# Patient Record
Sex: Female | Born: 1935
Health system: Southern US, Community
[De-identification: ages and names within clinical notes are randomized; demographics above are authoritative.]

## PROBLEM LIST (undated history)

## (undated) DIAGNOSIS — M1990 Unspecified osteoarthritis, unspecified site: Secondary | ICD-10-CM

## (undated) DIAGNOSIS — G4700 Insomnia, unspecified: Secondary | ICD-10-CM

## (undated) DIAGNOSIS — I2510 Atherosclerotic heart disease of native coronary artery without angina pectoris: Secondary | ICD-10-CM

## (undated) DIAGNOSIS — T7840XA Allergy, unspecified, initial encounter: Secondary | ICD-10-CM

## (undated) DIAGNOSIS — R001 Bradycardia, unspecified: Secondary | ICD-10-CM

## (undated) DIAGNOSIS — R011 Cardiac murmur, unspecified: Secondary | ICD-10-CM

## (undated) DIAGNOSIS — K760 Fatty (change of) liver, not elsewhere classified: Secondary | ICD-10-CM

## (undated) DIAGNOSIS — E785 Hyperlipidemia, unspecified: Secondary | ICD-10-CM

## (undated) DIAGNOSIS — I495 Sick sinus syndrome: Secondary | ICD-10-CM

## (undated) DIAGNOSIS — I480 Paroxysmal atrial fibrillation: Secondary | ICD-10-CM

## (undated) DIAGNOSIS — I10 Essential (primary) hypertension: Secondary | ICD-10-CM

## (undated) DIAGNOSIS — I219 Acute myocardial infarction, unspecified: Secondary | ICD-10-CM

## (undated) HISTORY — PX: CORONARY STENT PLACEMENT: SHX1402

## (undated) HISTORY — DX: Essential (primary) hypertension: I10

## (undated) HISTORY — DX: Bradycardia, unspecified: R00.1

## (undated) HISTORY — DX: Unspecified osteoarthritis, unspecified site: M19.90

## (undated) HISTORY — DX: Paroxysmal atrial fibrillation: I48.0

## (undated) HISTORY — DX: Allergy, unspecified, initial encounter: T78.40XA

## (undated) HISTORY — PX: FOOT SURGERY: SHX648

## (undated) HISTORY — DX: Atherosclerotic heart disease of native coronary artery without angina pectoris: I25.10

## (undated) HISTORY — DX: Insomnia, unspecified: G47.00

## (undated) HISTORY — PX: CARPAL TUNNEL RELEASE: SHX101

## (undated) HISTORY — PX: HERNIA REPAIR: SHX51

## (undated) HISTORY — DX: Fatty (change of) liver, not elsewhere classified: K76.0

## (undated) HISTORY — DX: Acute myocardial infarction, unspecified: I21.9

## (undated) HISTORY — DX: Hyperlipidemia, unspecified: E78.5

## (undated) HISTORY — PX: BACK SURGERY: SHX140

## (undated) HISTORY — DX: Sick sinus syndrome: I49.5

## (undated) HISTORY — PX: CHOLECYSTECTOMY: SHX55

## (undated) HISTORY — DX: Cardiac murmur, unspecified: R01.1

---

## 1997-10-06 ENCOUNTER — Other Ambulatory Visit: Admission: RE | Admit: 1997-10-06 | Discharge: 1997-10-06 | Payer: Self-pay | Admitting: Gastroenterology

## 1997-11-30 ENCOUNTER — Other Ambulatory Visit: Admission: RE | Admit: 1997-11-30 | Discharge: 1997-11-30 | Payer: Self-pay | Admitting: Gastroenterology

## 1998-02-15 ENCOUNTER — Encounter: Payer: Self-pay | Admitting: Cardiology

## 1998-02-15 ENCOUNTER — Ambulatory Visit (HOSPITAL_COMMUNITY): Admission: RE | Admit: 1998-02-15 | Discharge: 1998-02-15 | Payer: Self-pay | Admitting: Cardiology

## 1998-06-21 ENCOUNTER — Other Ambulatory Visit: Admission: RE | Admit: 1998-06-21 | Discharge: 1998-06-21 | Payer: Self-pay | Admitting: *Deleted

## 1999-06-01 ENCOUNTER — Encounter: Admission: RE | Admit: 1999-06-01 | Discharge: 1999-06-01 | Payer: Self-pay | Admitting: *Deleted

## 1999-06-01 ENCOUNTER — Encounter: Payer: Self-pay | Admitting: *Deleted

## 2000-02-28 ENCOUNTER — Other Ambulatory Visit: Admission: RE | Admit: 2000-02-28 | Discharge: 2000-02-28 | Payer: Self-pay | Admitting: *Deleted

## 2000-07-03 ENCOUNTER — Encounter: Admission: RE | Admit: 2000-07-03 | Discharge: 2000-07-03 | Payer: Self-pay | Admitting: Internal Medicine

## 2000-07-03 ENCOUNTER — Encounter: Payer: Self-pay | Admitting: Internal Medicine

## 2001-09-03 ENCOUNTER — Encounter: Payer: Self-pay | Admitting: *Deleted

## 2001-09-03 ENCOUNTER — Encounter: Admission: RE | Admit: 2001-09-03 | Discharge: 2001-09-03 | Payer: Self-pay | Admitting: *Deleted

## 2002-11-11 ENCOUNTER — Encounter: Payer: Self-pay | Admitting: Obstetrics and Gynecology

## 2002-11-11 ENCOUNTER — Encounter: Admission: RE | Admit: 2002-11-11 | Discharge: 2002-11-11 | Payer: Self-pay | Admitting: Obstetrics and Gynecology

## 2004-11-15 ENCOUNTER — Encounter: Admission: RE | Admit: 2004-11-15 | Discharge: 2004-11-15 | Payer: Self-pay | Admitting: Obstetrics and Gynecology

## 2005-12-17 ENCOUNTER — Encounter: Admission: RE | Admit: 2005-12-17 | Discharge: 2005-12-17 | Payer: Self-pay | Admitting: Internal Medicine

## 2006-09-12 ENCOUNTER — Ambulatory Visit: Payer: Self-pay | Admitting: Internal Medicine

## 2006-09-12 ENCOUNTER — Inpatient Hospital Stay (HOSPITAL_COMMUNITY): Admission: EM | Admit: 2006-09-12 | Discharge: 2006-09-30 | Payer: Self-pay | Admitting: Emergency Medicine

## 2006-09-12 HISTORY — PX: CORONARY ANGIOPLASTY WITH STENT PLACEMENT: SHX49

## 2006-09-17 ENCOUNTER — Encounter (INDEPENDENT_AMBULATORY_CARE_PROVIDER_SITE_OTHER): Payer: Self-pay | Admitting: Cardiology

## 2006-09-23 HISTORY — PX: PACEMAKER INSERTION: SHX728

## 2006-09-24 ENCOUNTER — Encounter (INDEPENDENT_AMBULATORY_CARE_PROVIDER_SITE_OTHER): Payer: Self-pay | Admitting: *Deleted

## 2006-09-24 ENCOUNTER — Ambulatory Visit: Payer: Self-pay | Admitting: *Deleted

## 2006-11-14 ENCOUNTER — Encounter (HOSPITAL_COMMUNITY): Admission: RE | Admit: 2006-11-14 | Discharge: 2007-02-12 | Payer: Self-pay | Admitting: Cardiology

## 2006-12-03 ENCOUNTER — Ambulatory Visit (HOSPITAL_COMMUNITY): Admission: RE | Admit: 2006-12-03 | Discharge: 2006-12-04 | Payer: Self-pay | Admitting: Cardiology

## 2006-12-03 HISTORY — PX: CORONARY ANGIOPLASTY WITH STENT PLACEMENT: SHX49

## 2007-01-16 ENCOUNTER — Encounter: Admission: RE | Admit: 2007-01-16 | Discharge: 2007-01-16 | Payer: Self-pay | Admitting: Family Medicine

## 2007-02-13 ENCOUNTER — Encounter (HOSPITAL_COMMUNITY): Admission: RE | Admit: 2007-02-13 | Discharge: 2007-03-25 | Payer: Self-pay | Admitting: Cardiology

## 2007-03-08 ENCOUNTER — Emergency Department (HOSPITAL_COMMUNITY): Admission: EM | Admit: 2007-03-08 | Discharge: 2007-03-08 | Payer: Self-pay | Admitting: Emergency Medicine

## 2007-04-07 ENCOUNTER — Encounter: Admission: RE | Admit: 2007-04-07 | Discharge: 2007-04-07 | Payer: Self-pay | Admitting: Family Medicine

## 2007-06-02 ENCOUNTER — Encounter: Admission: RE | Admit: 2007-06-02 | Discharge: 2007-06-02 | Payer: Self-pay | Admitting: Orthopedic Surgery

## 2007-12-31 IMAGING — CR DG CHEST 2V
2 series · 2 of 2 positions shown · non-contrast
Comparison: 09/19/06.

CLINICAL DATA: Pacemaker placement.
 CHEST - 2 VIEW:

[w chest pa]
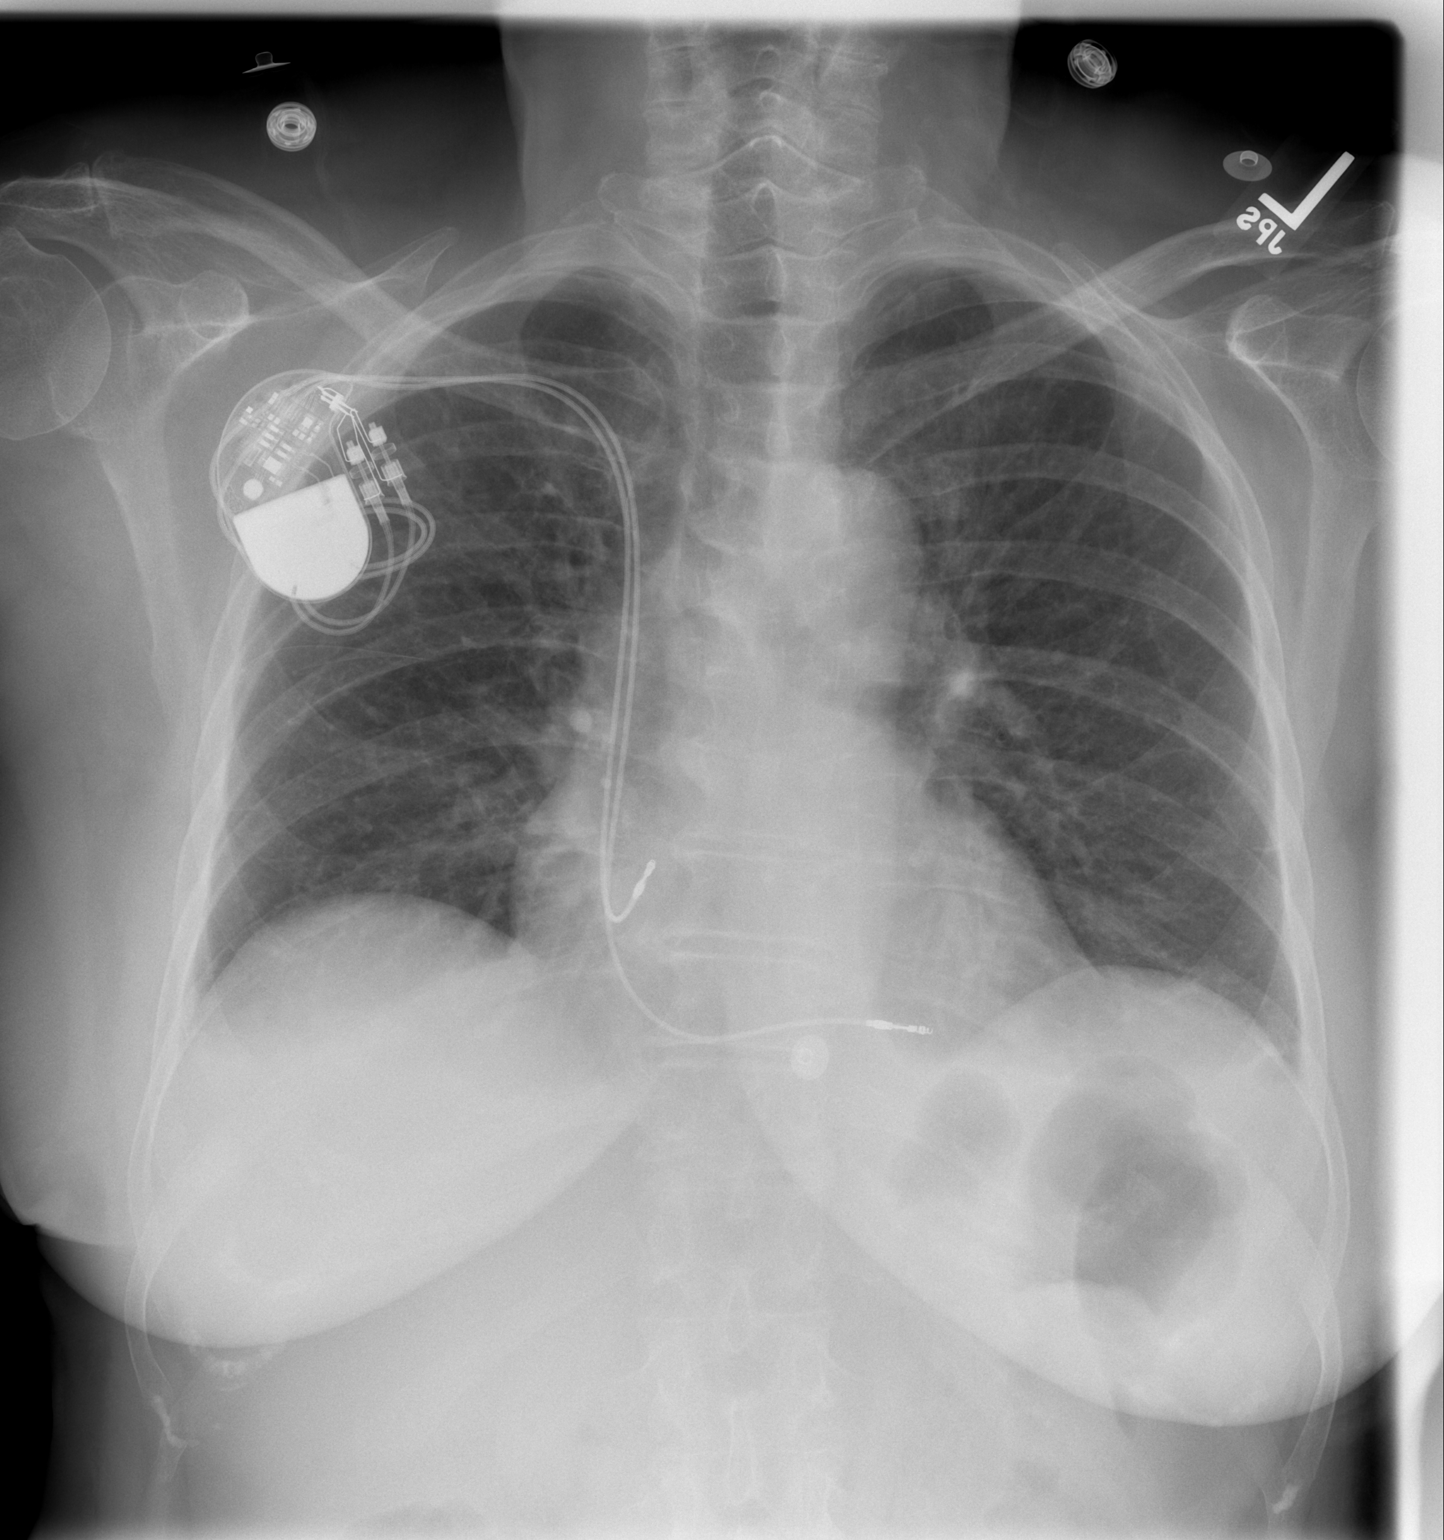

[w chest lat]
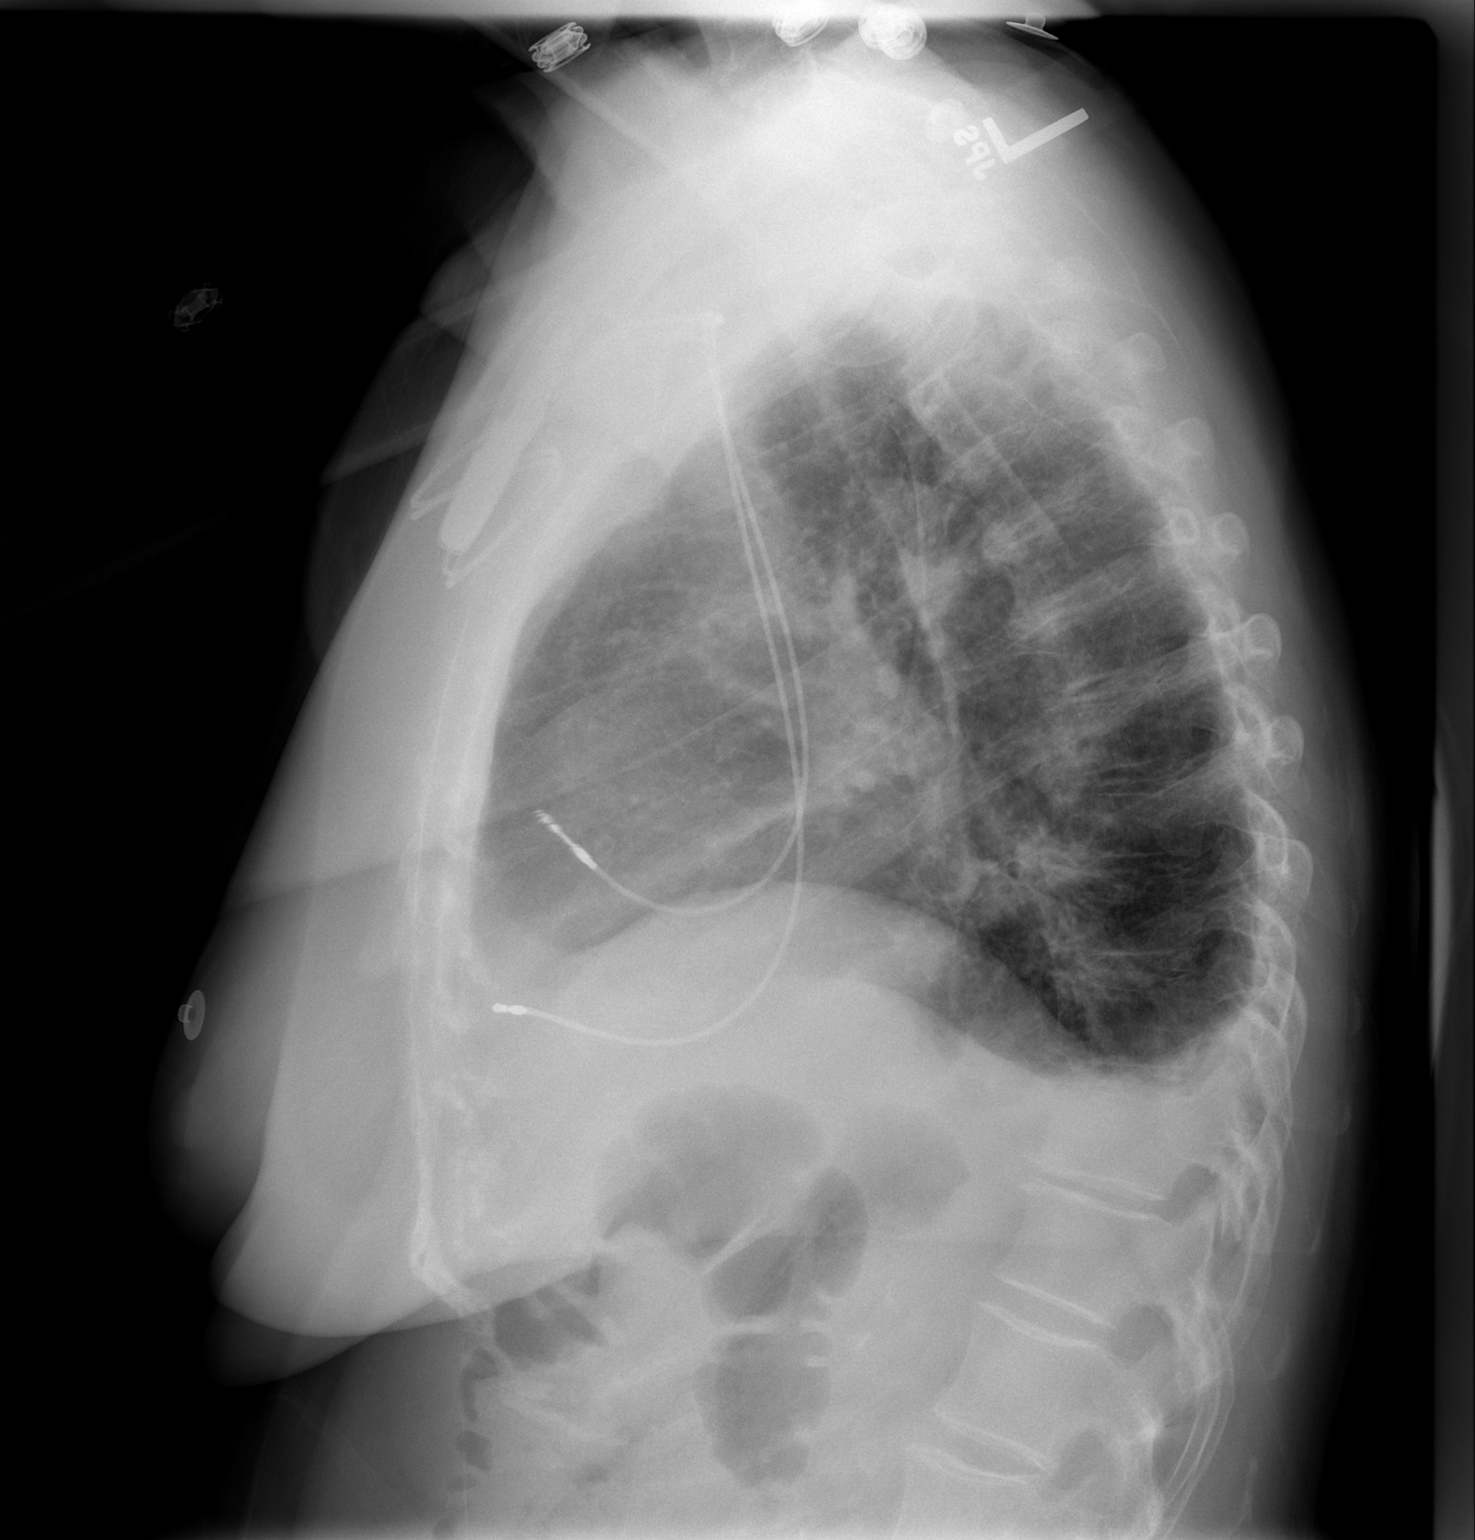

[2 of 2 positions shown; findings below may reference images not displayed]

FINDINGS: Prominent right-sided pacemaker in place with one wire in the right atrium and one wire in the right ventricle. No complicating features are demonstrated. Cardiac silhouette, mediastinal and hilar contours are stable. Persistent vascular congestion and possible mild interstitial edema with small bilateral pleural effusions.
IMPRESSION: 1.  Pacer wires in good position without complicating features.
 2.  Mild vascular congestion and possible interstitial edema.
 3.  Small bilateral pleural effusions.

## 2008-01-19 ENCOUNTER — Encounter: Admission: RE | Admit: 2008-01-19 | Discharge: 2008-01-19 | Payer: Self-pay | Admitting: Family Medicine

## 2009-01-06 ENCOUNTER — Encounter: Admission: RE | Admit: 2009-01-06 | Discharge: 2009-01-06 | Payer: Self-pay | Admitting: Family Medicine

## 2009-02-07 ENCOUNTER — Encounter: Admission: RE | Admit: 2009-02-07 | Discharge: 2009-02-07 | Payer: Self-pay | Admitting: Family Medicine

## 2009-03-23 ENCOUNTER — Encounter (INDEPENDENT_AMBULATORY_CARE_PROVIDER_SITE_OTHER): Payer: Self-pay | Admitting: General Surgery

## 2009-03-23 ENCOUNTER — Observation Stay (HOSPITAL_COMMUNITY): Admission: RE | Admit: 2009-03-23 | Discharge: 2009-03-24 | Payer: Self-pay | Admitting: General Surgery

## 2009-04-20 ENCOUNTER — Encounter: Admission: RE | Admit: 2009-04-20 | Discharge: 2009-04-20 | Payer: Self-pay | Admitting: Cardiovascular Disease

## 2009-04-25 ENCOUNTER — Ambulatory Visit (HOSPITAL_COMMUNITY): Admission: RE | Admit: 2009-04-25 | Discharge: 2009-04-25 | Payer: Self-pay | Admitting: Cardiovascular Disease

## 2010-02-09 ENCOUNTER — Encounter: Admission: RE | Admit: 2010-02-09 | Discharge: 2010-02-09 | Payer: Self-pay | Admitting: Gynecology

## 2010-09-29 LAB — COMPREHENSIVE METABOLIC PANEL
ALT: 77 U/L — ABNORMAL HIGH (ref 0–35)
Alkaline Phosphatase: 55 U/L (ref 39–117)
BUN: 14 mg/dL (ref 6–23)
CO2: 28 mEq/L (ref 19–32)
Chloride: 103 mEq/L (ref 96–112)
GFR calc non Af Amer: >60 mL/min (ref >60)
Glucose, Bld: 88 mg/dL (ref 70–99)
Potassium: 4.6 mEq/L (ref 3.5–5.1)
Sodium: 139 mEq/L (ref 135–145)
Total Bilirubin: 1.6 mg/dL — ABNORMAL HIGH (ref 0.3–1.2)

## 2010-09-29 LAB — DIFFERENTIAL
Basophils Absolute: 0 10*3/uL (ref 0.0–0.1)
Basophils Relative: 0 % (ref 0–1)
Eosinophils Absolute: 0.2 10*3/uL (ref 0.0–0.7)
Monocytes Relative: 11 % (ref 3–12)
Neutro Abs: 3.7 10*3/uL (ref 1.7–7.7)
Neutrophils Relative %: 53 % (ref 43–77)

## 2010-09-29 LAB — CBC
HCT: 40.8 % (ref 36.0–46.0)
Hemoglobin: 13.8 g/dL (ref 12.0–15.0)
RBC: 4.4 MIL/uL (ref 3.87–5.11)
RDW: 13.4 % (ref 11.5–15.5)

## 2010-09-29 LAB — URINALYSIS, ROUTINE W REFLEX MICROSCOPIC
Hgb urine dipstick: NEGATIVE
Nitrite: NEGATIVE
Protein, ur: NEGATIVE mg/dL
Specific Gravity, Urine: 1.008 (ref 1.005–1.030)
Urobilinogen, UA: 0.2 mg/dL (ref 0.0–1.0)

## 2010-09-29 LAB — URINE MICROSCOPIC-ADD ON

## 2010-11-07 NOTE — Cardiovascular Report (Signed)
[name], [name]                 ACCOUNT NO.:  [id number]   MEDICAL RECORD NO.:  [id number]          PATIENT TYPE:  OIB   LOCATION:  6522                         FACILITY:  [facility]   PHYSICIAN:  Madaline Savage, M.D.DATE OF BIRTH:  24-Aug-1927   DATE OF PROCEDURE:  12/03/2006  DATE OF DISCHARGE:                            CARDIAC CATHETERIZATION   PROCEDURES PERFORMED:  1. Selective coronary angiography by Judkins technique.  2. Retrograde left heart catheterization.  3. Left ventricular angiography.  4. Percutaneous coronary artery stenting of the distal right coronary      artery.   COMPLICATIONS:  None.   ENTRY SITE:  Entry site right femoral.   DYE USED:  Omnipaque.   PATIENT PROFILE:  [name] is a 77 year old woman with known right coronary  artery disease who has undergone deployment of two drug-eluting stents  into her proximal and mid RCA within the last 6 months.  She also  required permanent pacemaker implantation for paroxysmal atrial fib and  slow ventricular response.  She enters the hospital today because of  recent chest pain and the concern that there has been progression of her  coronary disease.  She has not had much in the way of circumflex or LAD  disease in the past and her ejection fraction has been good.   RESULTS:  Pressures:  The left ventricular pressure was 120/2.  End-  diastolic pressure is 10.  Central aortic pressure was 120/55, mean of  75.  No aortic valve gradient by pull-back technique.   ANGIOGRAPHIC RESULTS:  The left anterior descending coronary artery and  diagonal are both normal as is the left main.  There is a huge septal  perforator branch that is almost as big as a diagonal and it is normal.  The circumflex is small and nondominant.  One secondary branch of the  circumflex is small and contains a 95% stenosis.  I do not think it is  approachable with a balloon.  The patient may or may not have  pain from  it, so it needs to be  treated medically.   Right coronary artery is the dominant vessel.  The circulation giving  rise to posterior descending and posterolateral branch.  There are radio-  opaque stents proximally and midway down the RCA.  There is a new  stenosis of 95% that is focal just before the distal RCA bifurcates into  the PDA and PLA.   Percutaneous coronary intervention was performed on the 95% distal RCA  stenosis using a 6-French right Judkins guide catheter with a Prowater  wire.  Predilatation balloon, which was a 2.5 Maverick balloon , I  placed a 2.5 x 18 mm Vision stent into the distal RCA after successful  pre balloon dilatation.  I deployed that stent and postdilated with a  2.5 x 15 mm PowerSail balloon inflated to 18 atmospheres of pressure.  The 95% stenosis was reduced to 0% residual.   Left ventricular angiography showed vigorous contractility of all wall  segments and an ejection fraction of 60%.   FINAL IMPRESSION:  1. Unstable angina.  2. Successful stenting of a distal right coronary artery stenosis 95%      to 0 with a nondrug-eluting stent.  3. Good left ventricular systolic function.   PLAN:  The patient will be recovered in the holding area and transferred  to unit 6500 where she will remain overnight and if all goes well, she  will go home tomorrow.  The patient will remain on Plavix and aspirin  and no Coumadin for her atrial fibrillation.           ______________________________  Madaline Savage, M.D.     WHG/MEDQ  D:  12/03/2006  T:  12/04/2006  Job:  [id number]   cc:   [facility] - [facility]

## 2010-11-07 NOTE — Discharge Summary (Signed)
NAMECLARINDA, [name]                 ACCOUNT NO.:  [id number]   MEDICAL RECORD NO.:  [id number]          PATIENT TYPE:  OIB   LOCATION:  6522                         FACILITY:  [facility]   PHYSICIAN:  Madaline Savage, M.D.DATE OF BIRTH:  04-29-1928   DATE OF ADMISSION:  12/03/2006  DATE OF DISCHARGE:  12/04/2006                               DISCHARGE SUMMARY   DISCHARGE DIAGNOSES:  1. Unstable angina, status post revision and stenting to a new RCA      site this admission.  2. Coronary disease, DMI in March of 2008 treated with 2 endeavor      stents to the right coronary artery.  3. Preserved left ventricular function.  4. Heart block, status post PTVDP on September 23, 2006 by Dr. Lewayne Bunting.  5. Deep vein thrombosis during her hospitalization in April of 2008 of      the right lower extremity, not seen in follow-up Dopplers in the      office on Oct 31, 2006.  6. Coumadin therapy; this has been discontinued this admission.  7. Transient renal insufficiency after catheterization, stable.  8. Treated hypertension.  9. Paroxysmal atrial fibrillation.   HOSPITAL COURSE:  The patient is a pleasant 77 year old female who was  seen in the office on November 29, 2006.  She was seen as an add-on.  She was  sent over from cardiac rehab.  She had had some exertional chest pain  that sounded suspicious for angina.  She was seen by Dr. Clarene Duke and  myself in the office, and we actually wanted to admit her to the  hospital and hold her Coumadin and plan on doing a catheterization.  The  patient did not want to stay at the hospital, so we put her on a nitrate  and set her up for outpatient catheterization and had her stop her  Coumadin.  She was admitted December 03, 2006 and cathed by Dr. Elsie Lincoln.  The  catheterization revealed a normal LAD and diagonal.  The circumflex was  nondominant with a small 95% stenosis in the OM.  The RCA had patent  stents in the proximal and midportion with a new site of  95% in the  distal portion of the RCA.  She underwent placement of a Vision stent  which she tolerated well.  Dr. Elsie Lincoln feels she can be discharged on  December 04, 2006.  He thinks she can come off Coumadin at this point  permanently.  We also stopped her Imdur at discharge.  She will follow  up with Dr. Elsie Lincoln in a couple weeks in the office.   LABORATORY DATA:  Sodium 138, potassium 3.7, BUN 16, creatinine 1.0.  White count 7, hemoglobin 11.6, hematocrit 34.2, platelets 158.   ELECTROCARDIOGRAM:  Atrial paced rhythm.   DISCHARGE MEDICATIONS:  1. Coated aspirin 81 mg a day.  2. Metoprolol 25 mg b.i.d.  3. Diltiazem 180 mg a day.  4. Diovan HCTZ 80/12.5 daily.  5. Plavix 75 mg a day.  6. Simvastatin 40 mg a day.  7. Prilosec 20  mg a day.  8. Alprazolam 0.25 one-half tablet b.i.d. p.r.n.  9. Zyrtec 10 mg p.r.n.  10.Hydrocodone 500 mg p.r.n.  11.Nitroglycerin at home and knows to use this on a p.r.n. basis.   DISPOSITION:  The patient discharged in stable condition and will follow-  up with Dr. Elsie Lincoln in the office in a couple weeks.      Abelino Derrick, P.A.    ______________________________  Madaline Savage, M.D.    Lenard Lance  D:  12/04/2006  T:  12/04/2006  Job:  [id number]   cc:   Thora Lance, M.D.

## 2010-11-07 NOTE — Discharge Summary (Signed)
[name], [name]                 ACCOUNT NO.:  [id number]   MEDICAL RECORD NO.:  [id number]          PATIENT TYPE:  INP   LOCATION:  3706                         FACILITY:  [facility]   PHYSICIAN:  Nicki Guadalajara, M.D.     DATE OF BIRTH:  01-Nov-1927   DATE OF ADMISSION:  09/12/2006  DATE OF DISCHARGE:  09/30/2006                               DISCHARGE SUMMARY   DISCHARGE DIAGNOSES:  1. Acute inferior myocardial infarction, non-ST elevation.  2. Coronary artery disease with PTA and stent deployment to the right      coronary artery for acute stenosis.      a.     Diverticular outpouching of the left main with 40-50%       narrowing of the left anterior descending after the septal       perforator, and normal ejection fraction of 60%.  3. Sick sinus syndrome with paroxysmal atrial fibrillation with rapid      ventricular response.  4. Significant bradycardia with heart rates down to 30 and long pauses      of 3-second asystole.  5. Sick sinus syndrome with need for permanent transvenous pacemaker,      September 23, 2006, a [employer] Jude, by Dr. Ladona Ridgel.      a.     Temporary pacemaker inserted by Dr. Elsie Lincoln on September 16, 2006, for acute distress with bradycardia and rapid atrial       fibrillation.      b.     Temporary pacemaker placed on September 21, 2006, for recurrent       6-second pause and runs of atrial fibrillation with rates of 144       beats per minute.  6. Anemia.  7. Low-grade fever treated with Cipro.  8. Patent foramen ovale (PFO).  9. Deep venous thrombosis, with heparin and Coumadin.   DISCHARGE CONDITION:  Improved.   PROCEDURES:  1. September 12, 2006:  Combined left heart cath by Dr. Nicki Guadalajara.  2. September 12, 2006:  PTCA and nondrug-eluting stent to the RCA for 100%      stenosis by Dr. Tresa Endo.  3. September 16, 2006:  Brief cardiac cath with visualization of the right      coronary artery revealing patent stent.  Did have ostial PDA      stenosis of 95%.  4. September 16, 2006:  Temporary pacemaker placed by Dr. Elsie Lincoln secondary      to tachybrady syndrome and symptomatic tachycardia as well as      significant pauses.  5. September 21, 2006:  Temporary pacemaker placed by Dr. Madaline Savage.  6. September 23, 2006:  Permanent transvenous pacemaker by Dr. Lewayne Bunting, placed DDD [facility]. Jude.   DISCHARGE MEDICATIONS:  1. Plavix 75 mg one daily.  Do not stop.  2. Coumadin 5 mg daily in the evening between 5 and 6.  3. Diovan HCT 80/12.5 one daily.  4. Metoprolol tartrate 25 mg half a tablet twice a day.  5. Prilosec over-the-counter one daily.  6. Zocor 40 mg every evening.  7. Alprazolam 0.25 one-half tablet twice a day as needed.  8. Claritin 10 mg daily as needed.  9. Guaifenesin 1-2 teaspoons every 4 hours as needed.  10.Cardizem CD 180 mg daily.  11.Darvocet N 100 one or two every 4-6 hours as needed for pain.   DISCHARGE INSTRUCTIONS:  1. Have blood work done every week times 4 weeks every Monday.  2. Avoid salt; do no force fluids to drink.  3. Stop taking aspirin.  4. See pacemaker sheet for instructions on activity.  5. Increase activity slowly.  6. No lifting for 3 weeks.  7. No driving for 2 weeks.  8. May shower after September 30, 2006.  9. Low-sodium heart-healthy diet.   HISTORY OF PRESENT ILLNESS:  A 77 year old white female with a history  of four-vessel coronary disease treated medically, who presented to the  ER on September 12, 2006, after a week of chest tightness with exertion.  She presented to the emergency room in early a.m. on 03/20 with chest  pain radiation to the left shoulder and back. Her enzymes came back  positive.  EKG was nonspecific ST changes.  She was first placed on IV  heparin and nitro, and plans were for cardiac cath at first available.   PAST MEDICAL HISTORY:  1. Hypertension.  2. Coronary disease.  3. Aortic valve stenosis.  4. History of a hernia repair.   Her last Myoview was May 2007.  EF was 86%,  essentially normal perfusion  scan.  On echo in May 2007, she had mild aortic stenosis.  LV function  was normal.   ALLERGIES:  No known allergies.  She had refused statins in the past.   FAMILY HISTORY, SOCIAL HISTORY AND REVIEW OF SYSTEMS:  See H&P.   PHYSICAL EXAMINATION ON DISCHARGE:  VITAL SIGNS: Early on the morning of  September 30, 2006, blood pressure was 119/65, pulse 74, respiratory rate 20,  temperature 98.8, and oxygen saturation room air 93%.  HEART:  Regular rate and rhythm.  LUNGS:  Clear to auscultation bilaterally.  ABDOMEN:  Soft, nontender.  EXTREMITIES:  Incision healing nicely; no hematoma.   On venous Doppler study, DVT was noted in the calf vein of the right  lower extremity.  DVT courses along the mid posterior aspect of the  distal calf from about 5 inches above the heels of the mid calf.   EKGs:  Initial EKG was almost junctional in appearance.  Heart rate 55,  nonspecific ST changes.  Dr. Wallace Cullens felt this was atrial fibrillation with  junctional escape.  She did have minimal ST elevations in III and aVF.   EKGs would be junctional rhythm, incomplete bundle branch block, left.   On March 23, she had ST elevations in leads I, II and aVL as well as V5  and V6.  It was read as an acute MI.  On the 23rd she went from  junctional to atrial fibrillation with rapid ventricular response.  She  would go in and out of what appeared to be junctional to some sinus  brady with rapid atrial fibrillation.  On the 24th EKG, she had some  pacing on PAF.  On the 25th, sinus rhythm   March 31, she was AV pacing.   RADIOLOGY:  I do not have the original chest x-ray.  On follow-up, lungs  were clear.  No acute disease.  On the 22nd, mild interstitial edema.  On the 23rd, indistinct central vascular structures most consistent with  mild edema or vascular congestion.  By the 24th, pulmonary edema and scattered atelectasis, probable right pleural effusion.  On the 24th,  her  pacemaker was overlying the right ventricle.  No pneumothorax.  Diffuse interstitial edema and small right pleural effusion.  On the  27th, mild improvement in interstitial edema.  On 09/27/06, pacer wires  in good position without complicating features.  Mild vascular  congestion and possible interstitial edema, small bilateral effusions.  On April 6, interval decrease in small bilateral pleural effusions, mild  cardiomegaly and venous congestion.   HOSPITAL COURSE:  Ms. Ringel was admitted by Dr. Tresa Endo on call for Dr.  Elsie Lincoln on 09/12/06 after the patient presented with chest pain and  positive enzymes.  She underwent cardiac catheterization that day  revealing 100% to 99.9% RCA stenosis, undergoing PTCA and stent  deployment x2 stents of the RCA.  Other results as above.  She did well  and proceeded to the CCU.  Blood pressure ranged 89-104 systolic.  It  was positive for an acute inferior MI.  She had right subconjunctival  hematoma secondary to vomiting.  We attempted to help her grandson come  back from [place] through the [employer].  She did have some chest pain post  procedure.   She continued to slowly improve though she continued with right shoulder  pain and tightness.  No ischemia on her EKG.  By 09/20/06 at 11:45, the  patient's heart rate was either atrial fibrillation with rapid  ventricular response and then it would drop to the upper 30s to 40s.  She had no chest pain, but positive neck discomfort.  EKG was ordered.  She was having up to 3-second pauses, with heart rate at times down to  the 20s, and then would rebound up to 140 beats per minute.  Dr. Elsie Lincoln  came in and did a right coronary artery cath to ensure the stents were  stable and open and they were any, and then he placed a temporary  pacemaker.  The patient tolerated the procedure and actually felt  better.  She was anemic the next morning, but otherwise improving.   She also had some episodes of wide complex  tachycardia.  She then was in  rate controlled atrial fibrillation.  Creatinine was stable.  TEE was  done.  Intra-aortic septum was positive for PFO, mild atheromatosis,  degeneration of arch, complex plaque, not highly mobile.  At times blood  pressure would be low and we would hold her medications.  The patient  was monitored closely throughout the hospital stay.  On 09/18/2006, she  had maintained a regular rhythm or atrial fibrillation with her rate  controlled and the temporary pacemaker was discontinued.  The patient  was stable.  She had also some GI bleed and was on Cipro.  On  09/21/2006, unfortunately she developed significant bradycardia again  and a second temporary pacemaker was placed on 09/21/2006; this at 10:20  in the morning.   By 03/29, again had rapid atrial fibrillation.  Unfortunately the nurses  had documented ventricular tachycardia, but it was actually atrial fibrillation with heart rates of 144 and 6-second pauses.  Temporary  pacer was placed and plans were for permanent pacemaker the next day.  She was transferred back to the CCU.  EP consult was obtained for  pacemaker placement after her recent MI.  She underwent permanent  pacemaker placement on September 23, 2006,  by Dr. Ladona Ridgel.  She was stable  and was transferred to the floor and improving slowly, but then she did  complain of right lower leg pain.  We ordered venous Dopplers which were  positive for DVT.  She was then placed on heparin and  Coumadin crossover.  Pacer site remained stable.  Plans were to  discontinue the aspirin at discharge.  She continued to improve, was  ambulating, site looked well.  By 09/30/06, she was ready for discharge  home.  She was seen and discharged by Dr. Elsie Lincoln.      Darcella Gasman. Annie Paras, N.P.    ______________________________  Nicki Guadalajara, M.D.    LRI/MEDQ  D:  12/07/2006  T:  12/08/2006  Job:  [id number]   cc:   Nicki Guadalajara, M.D.  Madaline Savage, M.D.  Burnell Blanks, MD  Doylene Canning. Ladona Ridgel, MD

## 2010-11-10 NOTE — Cardiovascular Report (Signed)
NAMESAMEENA, [name]                 ACCOUNT NO.:  [id number]   MEDICAL RECORD NO.:  [id number]          PATIENT TYPE:  INP   LOCATION:  2807                         FACILITY:  [facility]   PHYSICIAN:  Nicki Guadalajara, M.D.     DATE OF BIRTH:  08/24/1927   DATE OF PROCEDURE:  09/12/2006  DATE OF DISCHARGE:                            CARDIAC CATHETERIZATION   INDICATIONS:  [name] is a 77 year old patient who has  previously documented mild multivessel coronary obstructive structure  disease by cardiac catheterization in 2004 by Dr. Elsie Lincoln.  She does have  a history of hypertension.  Apparently she has refused taking a  statin.  She had been doing relatively well on medical therapy until 3-  4 days ago when she began to experience recurrent episodes of chest  pain.  Approximately 5:00 p.m. last night she developed more significant  chest pressure.  This persisted essentially all night.  Early this  morning she presented to [facility].  Her symptoms improved  with sublingual nitroglycerin.  ECG did not show any ST-segment  elevation but did show nonspecific T changes.  Cardiac enzyme markers  were positive for acute coronary syndrome with positive CPK, MB, and  troponin level.  She was started on Integrilin, IV heparin, IV  nitroglycerin, given aspirin in the emergency room.  She is brought up  to the linear coronary unit for further evaluation.   PROCEDURE:  After pre premedication with Valium 5 mg intravenously, the  patient was prepped and draped in the usual fashion.  The right femoral  artery was punctured anteriorly, and a 5-French sheath was inserted.  Diagnostic catheterization was done with 5-French Judkins 4 left and  right coronary catheters.  A 5-French pigtail catheter was used for  biplane selective angiography.  The right catheter was also used for  selective angiography into the left subclavian artery in the event the  patient needed surgery to assess the LIMA.   Distal aortography was also  done due to the patient's hypertensive history and coronary disease to  make certain she did not have any significant aortoiliac disease or  renal vascular etiology to her hypertension.   At this point, I broke scrub.  The patient did have total occlusion of  her RCA accounting for acute coronary syndrome but had collateralization  at least to the crux from left-to-right filling and directly  contributing to her amelioration of symptoms.  I was concerned about the  possibility of diverticula or outpouching of her left main.  Prior to  proceeding with coronary intervention, I did ask Dr. Tyrone Sage to come  into the room where he reviewed the angiograms and felt that the left  main did not need to be operated on.  Consequently, attention was then  directed at opening up the recent occluded RCA.   The coronary intervention was a very difficult procedure secondary to  catheter backup difficulties, very difficult to cross the occlusion, and  also multiple sites were intervened upon.  A 6-French sheath was used  for the interventional procedure.  Initially an FR-4 right  guide was  used.  In the emergency room, the patient had already been started on  Integrilin and heparin.  Additionally, heparin was given prior to the  intervention to make certain ACT was therapeutic.  She also received a  total of 600 mg of oral Plavix for the procedure and received an  additional bolus of Integrilin since only a single bolus had been  administered in the emergency room.  Initially an Asahi medium wire was  advanced into the proximal RCA.  There was significant difficulty in  having this cross the occlusion and essentially was unable to cross  this.  The catheter wire oftentimes seemed to get into the most  proximal, very tiny the marginal-like branch.  A 2.0 Maverick balloon  was inserted to aid with crossing, but, again, this was unsuccessful.  Ultimately, a long Luge wire was  then inserted.  Again, there was  significant difficulty in crossing the blockage.  The proximal occlusion  was finally crossed.  However, the wire was not able to cross the second  occlusion just beyond the proximal one despite using a 2.0 over-the-wire  balloon for support.  At this point, the entire system was removed, and  a hockey-stick guide was inserted.  A Whisper wire was then inserted.  Again, there was significant difficulty in crossing the lesion, but  ultimately, with balloon backup support, the Whisper wire was able to  cross.  It was able to cross an additional proximal lesion and  ultimately was able to be advanced down the RCA distally.  The 2.0  Maverick balloon was used, and multiple dilatations were made in the  diffusely diseased proximal segment.  It was also apparent that there  was another area of 95% stenosis proximal to the crux.  Several  dilatations were made.  A 2.5 balloon was then inserted, and multiple  inflations were made diffusely in the proximal segment as well as in  this more distal lesion proximal to the crux.  Ultimately, the balloon  was then removed.  It was felt, due to significant tortuosity and backup  difficulties, that a chromium cobalt stent would best serve this  patient.  I elected to try an Endeavor chromium cobalt drug-eluting  stent.  A 3.0 x 15 mm Endeavor stent was then able to with difficulty  across the proximal diffusely diseased segment with catheter backup  difficulties and ultimately was able to be advanced to the lesion that  was 90-95% proximal to the crux.  Two inflations were made up to 14  atmospheres.  The balloon was then used to dilate the proximal region  further since it was assumed to be potentially very difficult to place a  24 mm length stent.  A 3.0 x 24 mm Endeavor stent was then ultimately  successfully inserted ultimately with much backup difficulties, but was able to cover the entire diffusely diseased proximal  segment.  Two  dilatations up to 14 atmospheres were made. The patient did receive  numerous doses of intracoronary nitroglycerin during the procedure.  Now  that the vessel was opened, there was significantly improved  visualization of the distal vessel.  There appeared to be an 80-85%  diffuse focal area of narrowing just proximal to the PDA takeoff beyond  the crux in the distal RCA.  It was not felt to be reachable by stent.  Consequently, a 3.25 Quantum balloon, which was going to be used for  post stent dilatation, was then able to be  advanced to this distal RCA  site just at the PDA takeoff, and very low-level inflations at 1, 2, and  3 atmospheres were made.  This resulted in a significant improvement  with the 85% stenosis being reduced to zero to less than 5%.  The 3.25  balloon was then used for post stent dilatation of the two previously  placed stents up to 3.25 mm, representing 12 atmospheres.  Scout  angiography confirmed an excellent angiographic result.  During the  procedure, the patient also did vomit, required Zofran.  At the  completion of the procedure, she was completely pain free, alert, with  stable hemodynamics.   HEMODYNAMIC DATA:  Central aortic pressure was 112/60.  Left ventricular  pressure was 112/15, post A-wave 21.   ANGIOGRAPHIC DATA:  There was evidence for coronary calcification  involving the left main-LAD system.   The left main was a moderate-size vessel.  On the superior portion of  the left main, there appeared to be a diverticular outpouch.  This was  reviewed with the surgeons who did not feel that surgical therapy was  required.  The left main then trifurcated into an LAD, a small  intermediate vessel, and the circumflex vessel.   The LAD had proximal irregularity and, after giving rise to a moderate-  size septal perforating artery, on a dip in the vessel had 40-50% smooth  narrowing.  The distal LAD ended in a twin-like system with an  LAD  diagonal system that was mildly tortuous, small caliber, and not  significantly stenosed.   The intermediate vessel was free of significant disease and small  caliber.   The circumflex vessel was tortuous, had mild luminal irregularities  without any significant obstructive disease.   The right coronary artery was totally occluded proximally.  The proximal  right coronary artery had sharp bends, and just prior to the total  occlusion, was 99% stenosed with some calcification.  That noted  previously, there were collaterals left-to-right supplying the distal  RCA; but beyond the crux, this was not visualized.   The left subclavian and internal artery were angiographically normal and  suitable in the event the patient required CVTS surgery.   Biplane cine angiography revealed low-normal global LV function with  mild focal mid to basal hypocontractility on the RAO projection and mild- low posterolateral hypocontractility on the LAO projection.   Distal aortography did not demonstrate any significant aortoiliac  disease.  There was no renal artery stenosis.   Following very difficult complex percutaneous coronary intervention to  the totally occluded RCA with initial TIMI zero flow, the right coronary  was opened and was a very large dominant vessel.  Following multiple  dilatations and ultimately insertion of the long 3-0 x 24 mm Endeavor  Medtronic drug-eluting stent placed proximally, the 100% occlusion  followed by diffuse narrowing of 95% was reduced to 0%.  There was 20-  30% narrowings in the mid portion of the RCA after the proximally placed  stent.  In the mid distal RCA, there had been a 90% stenosis.  Once  there was flow in the vessel, which was significantly eccentric, and  following PTCA and ultimate stenting with a 3-0 x 15 mm endeavor stent,  this was reduced to 0%.  Beyond the crux just proximal to the PDA  takeoff, there was 85% narrowing.  This was dilated to  less than 5%  utilizing the 3.25 mm balloon dilated to 2.9 mm (low atmospheres).  There is mild  irregularity of the PDA, but the RCA ended in a large PDA  system, two inferior LV branches, and a large posterolateral wall  system.  At the completion of the study,  there was brisk TIMI-3 flow.  The patient had stable hemodynamics.   IMPRESSION:  1. Acute coronary syndrome secondary to right coronary artery      occlusion, probably approximately 12-15 hours in duration, with a 3-      day history of progressive anginal symptomatology prior to its      probable occlusion last night.  2. Significant multivessel coronary artery disease with coronary      calcification involving the left main-left anterior descending      (LAD) system with a diverticular outpouching of the left main:      a.     40-50% narrowing in the LAD after the septal perforating       artery.      b.     Total occlusion of the right coronary artery (RCA)       ultimately in the diffusely diseased RCA system.      c.     Very difficult complex but successful percutaneous coronary       intervention with percutaneous transluminal cardiac angioplasty       (PTCA) and stenting and ultimate insertion of a 3-0 x 24 mm       Endeavor stent proximally, a 3-0 x 15 mm Endeavor stent in the mid       distal segment, and PTCA alone in the distal RCA just proximal to       the posterior descending artery takeoff, done with Integrilin,       Plavix, heparin and intracoronary nitroglycerin.           ______________________________  Nicki Guadalajara, M.D.     TK/MEDQ  D:  09/12/2006  T:  09/12/2006  Job:  [id number]   cc:   Madaline Savage, M.D.  Thora Lance, M.D.

## 2010-11-10 NOTE — Cardiovascular Report (Signed)
[name], [name]                 ACCOUNT NO.:  [id number]   MEDICAL RECORD NO.:  [id number]          PATIENT TYPE:  INP   LOCATION:  2905                         FACILITY:  [facility]   PHYSICIAN:  Madaline Savage, M.D.DATE OF BIRTH:  1928-02-16   DATE OF PROCEDURE:  09/16/2006  DATE OF DISCHARGE:                            CARDIAC CATHETERIZATION   PROCEDURES PERFORMED:  1. Dye-limited right coronary angiography in two views only (no left      ventriculogram, no left coronary angiogram).  2. Temporary transvenous pacemaker implantation left subclavian venous      approach with two flush injections into the left subclavian vein.   COMPLICATIONS:  None.   ENTRY SITE:  Right femoral and left subclavian vein.   PATIENT PROFILE:  The patient is a 77 year old white female who had  acute coronary syndrome and underwent cardiac catheterization by Dr. Daphene Jaeger on September 12, 2006, with stenting of the RCA with multiple drug-  eluting stents Endeavor 3.0 x 15 and 3.0 x 24).  The patient has  complained of right shoulder pain for the last several days.  Cardiac  enzymes have been negative.  EKGs have failed to show any sign of  ischemia but the EKGs have shown paroxysmal atrial fibrillation with  rapid rates alternating with a very slow rates, becoming very  problematic for the last 3-6 hours, prompting this need to come to the  catheterization laboratory for temporary pacemaker implantation and also  to discover whether this shoulder pain was coming from malfunction or  rethrombosis of the coronary artery stents.   We accomplished both these tasks in the catheterization laboratory  without any complications.  The left subclavian pacemaker was implanted  under the left clavicle under fluoroscopic guidance and with two flush  injections into the left subclavian vein for anatomic guidance.  The  positioning of the pacemaker tip was extremely difficult, requiring  approximately 10-15 minutes  of manipulations under fluoroscopic  guidance.  We finally did locate an appropriate area at the RV apex  which gave good pacemaker thresholds of between 1 and 2 mA.   Two-views of the right coronary artery showed that the vessel was patent  throughout the main channel of this large and dominant RCA with luminal  irregularities throughout, but with two widely patent Endeavor stents  placed proximally and mid.  It was noted that the ostium of the  posterior descending branch of the RCA, which is a 2.5-mm vessel with a  length of approximately 55 mm, was ostially stenosed with a stenosis of  about 90%.  I did not choose to intervene on this because the patient  has an ongoing contrast nephropathy.   FINAL DIAGNOSES:  1. Right coronary artery angiography showing patency of the main      channel of the right coronary artery and an ostial posterior      descending coronary artery stenosis with of 95%.  2. Successful implantation of a temporary transvenous pacemaker with      good positioning at the right ventricular apex.  3. Known contrast nephropathy from September 12, 2006, with a creatinine      rise from approximately 0.7 up to 2.1 or higher.   PLAN:  The patient will be taken back to the coronary care unit.  I  anticipate the need for an indwelling permanent pacemaker to be  discussed and hopefully implanted in the next few days by Dr. Jenne Campus.           ______________________________  Madaline Savage, M.D.     WHG/MEDQ  D:  09/16/2006  T:  09/16/2006  Job:  [id number]   cc:   [facility]

## 2010-11-10 NOTE — Cardiovascular Report (Signed)
NAMEHALEY, [name]                 ACCOUNT NO.:  [id number]   MEDICAL RECORD NO.:  [id number]          PATIENT TYPE:  INP   LOCATION:  2807                         FACILITY:  [facility]   PHYSICIAN:  Madaline Savage, M.D.DATE OF BIRTH:  02/14/1928   DATE OF PROCEDURE:  09/21/2006  DATE OF DISCHARGE:                            CARDIAC CATHETERIZATION   PROCEDURES PERFORMED:  1. Temporary pacemaker implantation on an urgent basis for tachybrady      syndrome consisting of a multiple pauses up to 6 seconds' in      duration.  2. Rapid AFib, paroxysmal, occurring multiple times during this      hospitalization.   PATIENT PROFILE:  [name] is 77 years old.  She presented with acute  myocardial infarction.  Percutaneous coronary stenting of the right  coronary artery was performed by Dr. Nicki Guadalajara on September 12, 2006, and  an occluded proximal RCA was opened and stented with drug-eluting  stents.  The patient developed atrial fib with rapid ventricular  response and was brought to the cath lab on September 16, 2006, for a left  subclavian temporary pacemaker implantation because of multiple hours of  persistent paroxysmal AFib with marked bradycardia and pausing.  At that  time, the left subclavian pacemaker was implanted and a dye-limited cath  was performed with two views of the RCA, with flush injections to make  sure that the right coronary artery was still patent.  I discussed  permanent pacemaker implantation with Dr. Lenise Herald.  It was  decided, however, that she would not require pacemaker implantation  because of resolution of her atrial fib which was documented on September 17, 2006.  The patient had been moved out of intensive care and was on  Unit 2000, when the nurses paged [place] urgently because the patient was  having 6-second pauses and was having rapid atrial fib as well.  At this point in time, we came to the cath lab to have temporary  pacemaker implanted for several  reasons.  One was because there was a  difficult placement from the left subclavian, and two because the  patient will need a permanent pacemaker implantation and we will need to  keep one subclavian entry site pristine for that upcoming procedure, so  I have proceeded with it from the right groin.   With sterile drapes in place, I implanted and RV inflow tract temporary  pacemaker under fluoroscopic guidance and confirmed position under  fluoroscopic guidance and also confirmed good pacing threshold at  approximately 0.3 milliamperes.  We gave intravenous diltiazem to slow  her fast rate down now that the temporary pacemaker was implanted.  We  plan a transfer to the subacute CCU for observation until a permanent  pacemaker can be implanted, hopefully on Monday.   FINAL DIAGNOSIS:  1. Recurrent paroxysmal atrial fibrillation with fast and slow rates,      specifically rates as fast as 144 and as slow as      6-second pauses, undoubtedly related to tachybrady syndrome.  2. Recent acute myocardial infarction with implantation of  drug-      eluting stents into the right coronary artery.   PLAN:  Observation in the subacute unit at the CCU and implantation of a  permanent pacemaker on Monday.           ______________________________  Madaline Savage, M.D.     WHG/MEDQ  D:  09/21/2006  T:  09/21/2006  Job:  [id number]   cc:   Cardiac Cath Lab

## 2010-11-10 NOTE — Consult Note (Signed)
[name], [name]                 ACCOUNT NO.:  [id number]   MEDICAL RECORD NO.:  [id number]          PATIENT TYPE:  INP   LOCATION:  2918                         FACILITY:  [facility]   PHYSICIAN:  Doylene Canning. Ladona Ridgel, MD    DATE OF BIRTH:  16-Dec-1927   DATE OF CONSULTATION:  09/22/2006  DATE OF DISCHARGE:                                 CONSULTATION   CONSULTATION IS REQUESTED BY:  Madaline Savage, M.D.   INDICATION FOR CONSULTATION:  Evaluation of patient with symptomatic  tachy-brady syndrome and paroxysmal A fib for possible pacemaker  insertion.   HISTORY OF PRESENT ILLNESS:  The patient is a 77 year old woman who was  admitted to hospital back on March 20 with substernal chest pain and  found to have a myocardial infarction for which she underwent urgent  intervention.  At that time she was found to have occluded RCA for which  she underwent stenting and angioplasty.  The patient postprocedure was  bothered by paroxysms of A fib and rapid ventricular response with  bradycardia.  She had apparently had initial temporary transvenous  pacemaker placed but then had this removed secondary to problems with  infection.  It was hoped that following her intervention she would have  less A fib and less bradycardia but she has unfortunately continued to  have rapid A fib along with post termination pauses of up to 6 seconds.  She is status post reinsertion of a temporary transvenous pacemaker in  the right femoral venous region and is now referred for consideration  for permanent pacemaker insertion.  The patient denies a history of  syncope.  Prior to her second temporary transvenous pacemaker she did  admit to feeling dizziness and rapid palpitations.  She has been treated  with IV calcium channel blockers.  She has also been on beta blockers in  the past.  She has a history of COPD her past.   ADDITIONAL PAST MEDICAL HISTORY:  Is also notable for hypertension and  aortic valve disease.   She has preserved LV function.   ALLERGIES:  She has known drug allergies.   MEDICATIONS:  She is on Ativan.  Outpatient she was on the metoprolol.  She is presently on intravenous Cardizem along with Avapro, Zocor,  Protonix, Plavix, and she is still on low-dose metoprolol.   FAMILY HISTORY:  Is unremarkable.   SOCIAL HISTORY:  The patient is widowed and lives alone.  She denies  tobacco or ethanol abuse.   REVIEW OF SYSTEMS:  Is notable for occasional wheezes. She has a history  of gastrointestinal bleeding.  She has a history of nausea.  She has a  history of arthritic complaints and history of chest pain in the past.  Rest of review of systems was viewed and found to be negative.   PHYSICAL EXAM:  GENERAL:  She is a pleasant elderly appearing woman who  is in no acute distress.  VITAL SIGNS:  Blood pressure was 130/80, the pulse was 80 and regular  (paced).  Respirations were 20, temperature is 98.  HEENT:  Normocephalic and  atraumatic.  Pupils equal round. Oropharynx  moist. Sclerae anicteric.  NECK:  Revealed 7 cm jugular venous distension.  There is occasional  cannon A waves present.  The trachea was midline.  Carotid 2+ and  symmetric.  LUNGS:  Revealed scattered wheezes but no increased work of breathing.  There are no rales, no rhonchi present.  The PMI was not enlarged nor  was it laterally displaced.  ABDOMEN:  Soft, nontender, nondistended.  No organomegaly.  Bowel sounds  are present, no rebound or guarding.  EXTREMITIES:  Demonstrated no cyanosis, clubbing or edema.  There was an  indwelling right femoral venous temporary pacemaker lead in place.  NEUROLOGIC:  Alert and oriented x3 with cranial nerves intact.  The  strength was 5/5 and symmetric.  The musculoskeletal exam was normal.  Though it was limited skin exam was normal.   IMPRESSION:  1. Symptomatic tachy-brady syndrome with atrial fibrillation and rapid      ventricular response followed by post  termination pauses of up to 6      seconds.  2. Status post temporary transvenous pacemaker insertion.  3. Recent acute myocardial infarction.  4. Active wheezing (chronic obstructive pulmonary disease with asthma      exacerbation).   DISCUSSION:  I discussed the treatment options with the patient and her  granddaughter.  The risks, benefits, goals, and expectations of  permanent pacemaker insertion discussed with the patient and she wishes  to proceed with this.  This will be scheduled at earliest possible  convenient time.      Doylene Canning. Ladona Ridgel, MD  Electronically Signed     GWT/MEDQ  D:  09/22/2006  T:  09/22/2006  Job:  [id number]   cc:   Madaline Savage, M.D.  Burnell Blanks, M.D.

## 2010-11-10 NOTE — Op Note (Signed)
[name], [name]                 ACCOUNT NO.:  [id number]   MEDICAL RECORD NO.:  [id number]          PATIENT TYPE:  INP   LOCATION:  2807                         FACILITY:  [facility]   PHYSICIAN:  Doylene Canning. Ladona Ridgel, MD    DATE OF BIRTH:  21-Feb-1928   DATE OF PROCEDURE:  09/23/2006  DATE OF DISCHARGE:                               OPERATIVE REPORT   PROCEDURE PERFORMED:  Implantation of dual-chamber pacemaker.   INDICATIONS:  Symptomatic tachy-brady syndrome with 6-second pauses.   INTRODUCTION:  The patient is a 77 year old woman with coronary disease  status post recent intervention with stenting who developed symptomatic  A fib with rapid ventricular response as well as prolonged pauses over 6  seconds.  She is status post temporary transvenous pacemaker is now  referred for permanent pacemaker.   PROCEDURE:  After informed obtained, the patient taken to diagnostic EP  lab in fasting state.  After usual preparation and draping, intravenous  fentanyl midazolam was given for sedation.  30 mL lidocaine was  infiltrated into the right infraclavicular region.  A 5 cm incision was  carried out over this region.  Electrocautery utilized to dissect down  to the fascial plane.  The right subclavian vein was punctured x2.  The  St. Jude model 1788T 52-cm active fixation pacing lead serial number [id number]  [id number] was advanced into the right ventricle and the St. Jude model 1788  T 46-cm active fixation pacing lead serial number [id number] was advanced  the right atrium.  Mapping was subsequently carried out in the right  ventricle and at the final site the R-waves were 11 mV and the pacing  impedance with lead actively fixed was 622 ohms, threshold 0.9 volts,  0.5 milliseconds.  10 volts pacing did not stimulate diaphragm.  With  the ventricular lead in satisfactory position on the RV septum,  attention then turned to placement of the atrial lead with the atrial  lead placed in the anterolateral wall  of the right atrium where P-waves  measured 1.8 mV (retrograde) the impedance was 402 ohms.  The threshold  was 1.1 volts of 0.5 milliseconds.  Again 10 volts pacing not stimulate  diaphragm.  With both atrial and ventricular leads in satisfactory  position and secured to subpectoralis fascia with a figure-of-eight silk  suture.  The pocket was irrigated with Kanamycin, electrocautery  utilized to assure hemostasis after the electrocautery utilized to make  subcutaneous pocket.  The St. Jude Zephir XL DR model [id number] dual-chamber  pacemaker serial number [id number] was connected to the atrial and  ventricular leads and placed back in the subcutaneous pocket.  Generator  secured with silk suture.  Additional kanamycin utilized to irrigate the  pocket.  The incision was closed with layer of 2-0 Vicryl followed by  layer of 3-0 Vicryl followed by layer of 4-0 Vicryl.  Benzoin was  painted on the skin Steri-Strips were applied and pressure dressing was  placed.  The patient was returned to her room in satisfactory condition.   COMPLICATIONS:  There were no immediate procedure complications.   RESULTS:  These demonstrate successful implantation of a St. Jude dual-  chamber pacemaker in a patient with symptomatic tachy-brady syndrome.      Doylene Canning. Ladona Ridgel, MD  Electronically Signed     GWT/MEDQ  D:  09/23/2006  T:  09/23/2006  Job:  [id number]   cc:   Madaline Savage, M.D.  Dr. Burnell Blanks

## 2010-11-16 ENCOUNTER — Encounter: Payer: Self-pay | Admitting: Family Medicine

## 2010-11-16 ENCOUNTER — Ambulatory Visit (INDEPENDENT_AMBULATORY_CARE_PROVIDER_SITE_OTHER): Payer: Medicare Other | Admitting: Family Medicine

## 2010-11-16 DIAGNOSIS — H9319 Tinnitus, unspecified ear: Secondary | ICD-10-CM

## 2010-11-16 DIAGNOSIS — Z950 Presence of cardiac pacemaker: Secondary | ICD-10-CM

## 2010-11-16 DIAGNOSIS — G4700 Insomnia, unspecified: Secondary | ICD-10-CM

## 2010-11-16 DIAGNOSIS — H9313 Tinnitus, bilateral: Secondary | ICD-10-CM

## 2010-11-16 DIAGNOSIS — Z9861 Coronary angioplasty status: Secondary | ICD-10-CM | POA: Insufficient documentation

## 2010-11-16 DIAGNOSIS — I2510 Atherosclerotic heart disease of native coronary artery without angina pectoris: Secondary | ICD-10-CM

## 2010-11-16 DIAGNOSIS — I10 Essential (primary) hypertension: Secondary | ICD-10-CM | POA: Insufficient documentation

## 2010-11-16 DIAGNOSIS — R21 Rash and other nonspecific skin eruption: Secondary | ICD-10-CM

## 2010-11-16 DIAGNOSIS — E7800 Pure hypercholesterolemia, unspecified: Secondary | ICD-10-CM

## 2010-11-16 NOTE — Patient Instructions (Signed)
Call me if you have questions or concerns.  We'll request your records.  Take care.

## 2010-11-17 ENCOUNTER — Encounter: Payer: Self-pay | Admitting: Family Medicine

## 2010-11-17 NOTE — Assessment & Plan Note (Signed)
D/w pt.  TM/pinna exam wnl today.  Will follow, she'll let me know if it's more bothersome.  Likely related to noise exposure.

## 2010-11-17 NOTE — Assessment & Plan Note (Signed)
Requesting records.   

## 2010-11-17 NOTE — Assessment & Plan Note (Signed)
Per cards, CP free.

## 2010-11-17 NOTE — Assessment & Plan Note (Signed)
BP okay for now, Requesting records.

## 2010-11-17 NOTE — Progress Notes (Signed)
New pt to est care.  Requesting records.  Tinnitus, h/o noise exposure.  B, longstanding. H/o mild hearing loss but isn't interested in hearing aids.   Rash on L leg, has fu with derm pending for biopsy.  Also with resolving poison oak on R wrist.    Insomnia, occ takes xanax with relief.  Occ day naps, but o/w with good sleep hygiene.  Not a new problem for the patient.  No ADE from the meds.    Hypertension:    Using medication without problems or lightheadedness: yes Chest pain with exertion:no Edema:minimal, occ, self resolves Short of breath:no Other issues:as above  Meds, vitals, and allergies reviewed.   PMH and SH reviewed  ROS: See HPI.  Otherwise negative.    GEN: nad, alert and oriented, pleasant HEENT: mucous membranes moist NECK: supple w/o LA CV: rrr. Pacer placed on R chest wall PULM: ctab, no inc wob ABD: soft, +bs EXT: no edema SKIN: no acute rash but resolving irregular rash on R forearm noted.  Well demarcated blanching patch on posterior L thigh noted.

## 2010-11-17 NOTE — Assessment & Plan Note (Signed)
Arm rash is resolving and she'll fu with derm re:leg.

## 2010-11-17 NOTE — Assessment & Plan Note (Signed)
Per cards, Requesting records.

## 2010-11-17 NOTE — Assessment & Plan Note (Signed)
Tolerated, no change in meds.

## 2010-12-04 ENCOUNTER — Encounter: Payer: Self-pay | Admitting: Family Medicine

## 2010-12-04 ENCOUNTER — Telehealth: Payer: Self-pay | Admitting: Family Medicine

## 2010-12-04 NOTE — Telephone Encounter (Signed)
Please call pt.  I saw her labs from 06/2010.  They were fine.  I would recheck in 06/2011 at a lab visit and then have a OV a few days after that. Schedule OV sooner, prn.  Thanks.

## 2010-12-05 NOTE — Telephone Encounter (Signed)
LMOVM for pt to return call 

## 2010-12-06 NOTE — Telephone Encounter (Signed)
Advised pt, appts made.

## 2010-12-18 ENCOUNTER — Ambulatory Visit (INDEPENDENT_AMBULATORY_CARE_PROVIDER_SITE_OTHER): Payer: Medicare Other | Admitting: Family Medicine

## 2010-12-18 ENCOUNTER — Encounter: Payer: Self-pay | Admitting: Family Medicine

## 2010-12-18 DIAGNOSIS — T148XXA Other injury of unspecified body region, initial encounter: Secondary | ICD-10-CM

## 2010-12-18 DIAGNOSIS — T148 Other injury of unspecified body region: Secondary | ICD-10-CM

## 2010-12-18 DIAGNOSIS — W57XXXA Bitten or stung by nonvenomous insect and other nonvenomous arthropods, initial encounter: Secondary | ICD-10-CM

## 2010-12-18 MED ORDER — DOXYCYCLINE HYCLATE 100 MG PO TABS: 100.0000 mg | ORAL_TABLET | Freq: Two times a day (BID) | ORAL | Status: AC

## 2010-12-18 NOTE — Assessment & Plan Note (Signed)
Reassured about the second lesion.  Given the duration of attachment for the removed tick, I would treat with prophx dose of doxy and then f/u prn, esp if fever/rash.  She understood.

## 2010-12-18 NOTE — Patient Instructions (Signed)
Take the doxycycline for 2 doses and then let us know if you have a rash or other concerns.  Take care.

## 2010-12-18 NOTE — Progress Notes (Signed)
Tick bite. 1 removed, another to be examined.  No FCNAV. Minimal edema and irritation at the first bite site.    Meds, vitals, and allergies reviewed.   ROS: See HPI.  Otherwise, noncontributory.  nad Chaperoned exam. Small amount of irritation but no fluctuant mass at the bite site on the R labia majora.  The other "tick" was a small superficial varicose vein that didn't need treatment.

## 2011-01-11 ENCOUNTER — Encounter: Payer: Self-pay | Admitting: Family Medicine

## 2011-01-15 ENCOUNTER — Other Ambulatory Visit: Payer: Self-pay | Admitting: Gynecology

## 2011-01-15 DIAGNOSIS — Z1231 Encounter for screening mammogram for malignant neoplasm of breast: Secondary | ICD-10-CM

## 2011-02-12 ENCOUNTER — Ambulatory Visit: Payer: Medicare Other

## 2011-02-16 ENCOUNTER — Ambulatory Visit
Admission: RE | Admit: 2011-02-16 | Discharge: 2011-02-16 | Disposition: A | Discharge status: Home / Self Care | Payer: Medicare Other | Source: Ambulatory Visit | Attending: Gynecology | Admitting: Gynecology

## 2011-02-16 DIAGNOSIS — Z1231 Encounter for screening mammogram for malignant neoplasm of breast: Secondary | ICD-10-CM

## 2011-03-05 ENCOUNTER — Encounter: Payer: Self-pay | Admitting: Family Medicine

## 2011-03-05 ENCOUNTER — Other Ambulatory Visit: Payer: Self-pay | Admitting: *Deleted

## 2011-03-05 ENCOUNTER — Ambulatory Visit (INDEPENDENT_AMBULATORY_CARE_PROVIDER_SITE_OTHER): Payer: Medicare Other | Admitting: Family Medicine

## 2011-03-05 VITALS — BP 144/70 | HR 70 | Temp 97.8°F | Wt 132.0 lb

## 2011-03-05 DIAGNOSIS — R42 Dizziness and giddiness: Secondary | ICD-10-CM | POA: Insufficient documentation

## 2011-03-05 DIAGNOSIS — W540XXA Bitten by dog, initial encounter: Secondary | ICD-10-CM | POA: Insufficient documentation

## 2011-03-05 DIAGNOSIS — T148XXA Other injury of unspecified body region, initial encounter: Secondary | ICD-10-CM

## 2011-03-05 DIAGNOSIS — Z23 Encounter for immunization: Secondary | ICD-10-CM

## 2011-03-05 DIAGNOSIS — R21 Rash and other nonspecific skin eruption: Secondary | ICD-10-CM

## 2011-03-05 DIAGNOSIS — M542 Cervicalgia: Secondary | ICD-10-CM | POA: Insufficient documentation

## 2011-03-05 MED ORDER — VALSARTAN-HYDROCHLOROTHIAZIDE 80-12.5 MG PO TABS: 0.5000 | ORAL_TABLET | Freq: Every day | ORAL | Status: DC

## 2011-03-05 MED ORDER — ZOSTER VACCINE LIVE 19400 UNT/0.65ML ~~LOC~~ SOLR: 0.6500 mL | Freq: Once | SUBCUTANEOUS | Status: DC

## 2011-03-05 NOTE — Patient Instructions (Signed)
Cut the diovan in half.  Use a heating pad on your neck.  Let me know if the neck pain or dizzy sensation (when you get out of bed) changes.   Let us know if you have redness or drainage in your left hand.   Take care. Glad to see you.

## 2011-03-05 NOTE — Progress Notes (Signed)
Dog bite.  Her dog was recently bitten by a copperhead and had internal bleeding.  The dog had to be put down.  The dog nicked her left hand Wednesday night (the patient was holding the dog at the time).  It was bruised and swollen.  Minimally painful then, less painful now.  Bruising is some better now.  No spreading erythema.  Vertigo feeling with position change.  Noted most from laying to standing. No focal neuro changes.  She has had recent history of low BP.  Compliant with BP meds.  No CP.    H/o neck pain, s/p chiropractor treatment.  H/o pain in the C spine with movement, also with posterior paraspinal muscle pain, esp on the R side with pain up to the occiput.  No trauma.  Some better with heat prev.   Meds, vitals, and allergies reviewed.   ROS: See HPI.  Otherwise, noncontributory.  nad ncat Neck supple C spine crepitus on ROM noted rrr with murmur that radiates to the carotids ctab Back w/o midline pain but R Cspine paraspinal muscles are tender Dorsum of L hand with small, superficial and healing epithelial disruption with resolving surrounding bruise, but no erythema.  NV intact w/o fluctuant mass.

## 2011-03-05 NOTE — Assessment & Plan Note (Signed)
I think this may be BP related and not true vertigo. Dec ACE/HCTZ and pt to monitor BP and sx.  She agrees.

## 2011-03-05 NOTE — Assessment & Plan Note (Signed)
Tetanus done today.  Resolving and no need for abx based on the timeline and the exam today.  She agrees.

## 2011-03-05 NOTE — Assessment & Plan Note (Signed)
Likely unrelated to the vertigo.  Pt to use local heat for muscle pain and f/u prn.

## 2011-03-07 ENCOUNTER — Telehealth: Payer: Self-pay | Admitting: *Deleted

## 2011-03-07 NOTE — Telephone Encounter (Signed)
Needs shingles vaccine Rx sent to pharmacy.

## 2011-03-08 ENCOUNTER — Other Ambulatory Visit: Payer: Self-pay | Admitting: *Deleted

## 2011-03-08 MED ORDER — ZOSTER VACCINE LIVE 19400 UNT/0.65ML ~~LOC~~ SOLR: 0.6500 mL | Freq: Once | SUBCUTANEOUS | Status: DC

## 2011-03-08 NOTE — Telephone Encounter (Signed)
Rx. Sent to pharmacy, [facility].  Patient advised to call pharmacy (# given to pt.) to find out when they can do the injection.

## 2011-03-12 ENCOUNTER — Ambulatory Visit (INDEPENDENT_AMBULATORY_CARE_PROVIDER_SITE_OTHER): Payer: Medicare Other | Admitting: Internal Medicine

## 2011-03-12 ENCOUNTER — Encounter: Payer: Self-pay | Admitting: Internal Medicine

## 2011-03-12 VITALS — BP 127/60 | HR 65 | Temp 97.7°F | Ht 60.0 in | Wt 131.0 lb

## 2011-03-12 DIAGNOSIS — R51 Headache: Secondary | ICD-10-CM

## 2011-03-12 MED ORDER — TRAMADOL HCL 50 MG PO TABS: 50.0000 mg | ORAL_TABLET | Freq: Three times a day (TID) | ORAL | Status: DC | PRN

## 2011-03-12 NOTE — Patient Instructions (Signed)
Please try the heat regularly on your head and the tramadol up to three times daily. Call in 2-3 weeks if not significantly better

## 2011-03-12 NOTE — Progress Notes (Signed)
Subjective:    Patient ID: [name], female    DOB: 07-21-1927, 77 y.o.   MRN: [id number]  HPI Here with daughter "My head is bothering me' Goes back a long time but getting worse Feels it in right occiput---hears snapping that is worse with movement Pain above right ear also Esp tight and painful if she turns to her right Constant achy, heavy feeling. Not sharp or burning  Gets pressure sensation inside occ pain referred to forehead  Some dizziness No amaurosis fugax occ gets sense of vision blinking though  No sig arm or leg weakness No speech or swallowing problems  No pain when chewing Chronic tinnitus ---occ seems worse  Has tried "a little bit of everything" Different topical treatements, heat, ice. Heat does the best Tramadol helps also---uses occ. Allergy pill for ears seems to help some also  Current Outpatient Prescriptions on File Prior to Visit  Medication Sig Dispense Refill  . ALPRAZolam (XANAX) 0.25 MG tablet Take 0.25 mg by mouth at bedtime as needed.        Marland Kitchen aspirin 81 MG tablet Take 81 mg by mouth every other day.       . Cholecalciferol 1000 UNITS capsule Take 2,000 Units by mouth daily.       . clopidogrel (PLAVIX) 75 MG tablet Take 75 mg by mouth daily.        . metoprolol tartrate (LOPRESSOR) 25 MG tablet Take 25 mg by mouth 2 (two) times daily.       . nitroGLYCERIN (NITROSTAT) 0.4 MG SL tablet Place 0.4 mg under the tongue every 5 (five) minutes as needed.        . pantoprazole (PROTONIX) 40 MG tablet Take 40 mg by mouth daily.        . polyethylene glycol (MIRALAX / GLYCOLAX) packet Take 17 g by mouth daily.        . potassium chloride (KLOR-CON) 10 MEQ CR tablet Take 10 mEq by mouth daily.        . Psyllium (METAMUCIL SMOOTH TEXTURE PO) Use daily       . simvastatin (ZOCOR) 40 MG tablet Take 40 mg by mouth at bedtime.        . traMADol (ULTRAM) 50 MG tablet Take 50 mg by mouth as needed.        . valsartan-hydrochlorothiazide (DIOVAN-HCT)  80-12.5 MG per tablet Take 0.5 tablets by mouth daily.        No Known Allergies  Past Medical History  Diagnosis Date  . Arthritis   . Allergy   . Hyperlipidemia   . Hypertension   . CAD (coronary artery disease)   . Myocardial infarction   . Heart murmur   . Insomnia     Past Surgical History  Procedure Date  . Pacemaker insertion   . Cholecystectomy   . Back surgery   . Carpal tunnel release     left  . Foot surgery     Left  . Hernia repair     R inguinal    Family History  Problem Relation Age of Onset  . Early death Mother     died at age 25, unknown cause  . Heart disease Father   . Diabetes Father     History   Social History  . Marital Status: Widowed    Spouse Name: N/A    Number of Children: N/A  . Years of Education: N/A   Occupational History  . Not on file.  Social History Main Topics  . Smoking status: Never Smoker   . Smokeless tobacco: Never Used  . Alcohol Use: No  . Drug Use: No  . Sexually Active: Not on file   Other Topics Concern  . Not on file   Social History Narrative   Widowed in 1992, [place] daughters, both localRetired from [employer] , yardwork    Review of Systems No stomach trouble  Appetite is fine Sleeps okay No regular mood problems like depression    Objective:   Physical Exam  Constitutional: She appears well-developed and well-nourished. No distress.  HENT:  Head: Normocephalic and atraumatic.  Right Ear: External ear normal.  Left Ear: External ear normal.  Mouth/Throat: Oropharynx is clear and moist. No oropharyngeal exudate.       No temporal bruits TMs normal Mild tenderness along muscles in right occiput No masses  Eyes: Conjunctivae and EOM are normal. Pupils are equal, round, and reactive to light.       Fundi benign  Neck: Normal range of motion. No thyromegaly present.       Decreased extension Pain with rotation to right but not with tilt to right    Lymphadenopathy:    She has no cervical adenopathy.  Neurological: She is alert. She has normal strength. She displays no atrophy and no tremor. No cranial nerve deficit. She exhibits normal muscle tone. She displays a negative Romberg sign. Coordination and gait normal.          Assessment & Plan:

## 2011-03-12 NOTE — Assessment & Plan Note (Signed)
Seems to be centered in muscles along posterior head (mostly occiput and partially parietal area) Doesn't appear to be internal at all Heat and tramadol have helped Chiropractic seemed to make it worse Neuro exam is normal----reassured her  Will continue with heat Has only taken tramadol once a day---will try increasing to tid Refer to PT if not better in 2-3 weeks

## 2011-03-19 ENCOUNTER — Telehealth: Payer: Self-pay | Admitting: *Deleted

## 2011-03-19 NOTE — Telephone Encounter (Signed)
Okay to continue aleve 1 tab tid along with 1/2 tramadol  No, I don't think she had a stroke or mini-stroke. Seems to be related to muscle strain and damage

## 2011-03-19 NOTE — Telephone Encounter (Signed)
Inetta Fermo notified as instructed by telephone. Inetta Fermo will call back if symptoms persist or pt's condition changes.

## 2011-03-19 NOTE — Telephone Encounter (Signed)
Pt was told to take tramadol three times a day for her headache.  Her daughter states this was too strong for her.  It made her glassy eyed, sleepy all the time.  They have cut her back to one half three times a day along with an aleeve.  She says this helps but hasn't totally taken the pain away.  She asks if ok to continue the aleve three times a day.  Also, pt's daughters have discussed it and they want to know for absolute sure whether or not pt had a stroke, since pt has family history of strokes.  Please advise.

## 2011-04-06 LAB — HEPATIC FUNCTION PANEL
AST: 39 — ABNORMAL HIGH
Albumin: 3.8
Alkaline Phosphatase: 53
Total Protein: 6.6

## 2011-04-06 LAB — I-STAT 8, (EC8 V) (CONVERTED LAB)
Acid-base deficit: 1
Chloride: 105
Glucose, Bld: 125 — ABNORMAL HIGH
Hemoglobin: 12.6
Potassium: 3.4 — ABNORMAL LOW
Sodium: 140
pH, Ven: 7.394 — ABNORMAL HIGH

## 2011-04-06 LAB — LIPASE, BLOOD: Lipase: 40

## 2011-04-06 LAB — DIFFERENTIAL
Basophils Absolute: 0
Basophils Relative: 0
Eosinophils Absolute: 0.2
Eosinophils Relative: 3
Lymphocytes Relative: 43
Lymphs Abs: 2.4
Monocytes Absolute: 0.6
Monocytes Relative: 11
Neutro Abs: 2.4
Neutrophils Relative %: 43

## 2011-04-06 LAB — POCT CARDIAC MARKERS
CKMB, poc: 1.3
CKMB, poc: 1.4
Operator id: 277751
Operator id: 277751
Troponin i, poc: <0.05
Troponin i, poc: <0.05

## 2011-04-06 LAB — CBC
Hemoglobin: 12.2
MCHC: 34.2
Platelets: 158
RDW: 13.2

## 2011-04-06 LAB — PROTIME-INR
INR: 0.9
Prothrombin Time: 12.5

## 2011-04-12 LAB — BASIC METABOLIC PANEL
BUN: 16
Creatinine, Ser: 1.04
GFR calc non Af Amer: 51 — ABNORMAL LOW

## 2011-04-12 LAB — CBC
Platelets: 158
WBC: 7

## 2011-06-25 ENCOUNTER — Telehealth: Payer: Self-pay | Admitting: Internal Medicine

## 2011-06-25 ENCOUNTER — Encounter: Payer: Self-pay | Admitting: Family Medicine

## 2011-06-25 ENCOUNTER — Ambulatory Visit (INDEPENDENT_AMBULATORY_CARE_PROVIDER_SITE_OTHER): Payer: Medicare Other | Admitting: Family Medicine

## 2011-06-25 DIAGNOSIS — J069 Acute upper respiratory infection, unspecified: Secondary | ICD-10-CM

## 2011-06-25 NOTE — Telephone Encounter (Signed)
Error

## 2011-06-25 NOTE — Patient Instructions (Addendum)
Drink plenty of fluids, take tylenol as needed, and gargle with warm salt water for your throat.  This should gradually improve.  Take care.  Let us know if you have other concerns.  I would get a pneumonia shot after you are feeling better.

## 2011-06-25 NOTE — Progress Notes (Signed)
Cold sx since Thursday 06/21/11.  She feels some better today- 'my throat isn't as clogged with this mess'.  ST, rhinorrhea, congestion. Bloody and discolored rhinorrhea/postnasal gtt.  Using salt water gargles.  Temp 100.5 = tmax a few days ago.  She did have flu shot.  Taking chlorpheniramine and DM for cough along with zyrtec.    Meds, vitals, and allergies reviewed.   ROS: See HPI.  Otherwise, noncontributory.  GEN: nad, alert and oriented HEENT: mucous membranes moist, tm w/o erythema, nasal exam w/o erythema, clear discharge noted,  OP with cobblestoning, max sinus not ttp x4 NECK: supple w/o LA CV: rrr.  Murmur noted PULM: ctab, no inc wob EXT: no edema SKIN: no acute rash

## 2011-06-26 ENCOUNTER — Encounter: Payer: Self-pay | Admitting: Family Medicine

## 2011-06-26 ENCOUNTER — Other Ambulatory Visit: Payer: Self-pay | Admitting: Family Medicine

## 2011-06-26 DIAGNOSIS — M810 Age-related osteoporosis without current pathological fracture: Secondary | ICD-10-CM | POA: Insufficient documentation

## 2011-06-26 DIAGNOSIS — I2510 Atherosclerotic heart disease of native coronary artery without angina pectoris: Secondary | ICD-10-CM

## 2011-06-26 NOTE — Assessment & Plan Note (Signed)
Nontoxic, lungs clear, improving and okay for outpatient f/u.  Likely viral, no other intervention other than supportive tx. F/u prn.  She agrees.

## 2011-06-27 ENCOUNTER — Telehealth: Payer: Self-pay | Admitting: Internal Medicine

## 2011-06-27 MED ORDER — AMOXICILLIN 875 MG PO TABS: 875.0000 mg | ORAL_TABLET | Freq: Two times a day (BID) | ORAL | Status: AC

## 2011-06-27 NOTE — Telephone Encounter (Signed)
Patient was seen on Monday for viral infection.  States she is worse with hoarseness, coughing up bloody, yellow brown mucous. Fever of 99.5 to 100.5. Wants to know if you can call in an antibiotic for her.

## 2011-06-27 NOTE — Telephone Encounter (Signed)
Rx called to [facility] per Tina's request.

## 2011-06-27 NOTE — Telephone Encounter (Signed)
Verify pharm and call in.

## 2011-06-28 ENCOUNTER — Ambulatory Visit: Payer: Medicare Other | Admitting: Family Medicine

## 2011-06-28 DIAGNOSIS — Z0289 Encounter for other administrative examinations: Secondary | ICD-10-CM

## 2011-07-04 ENCOUNTER — Other Ambulatory Visit (INDEPENDENT_AMBULATORY_CARE_PROVIDER_SITE_OTHER): Payer: Medicare Other

## 2011-07-04 DIAGNOSIS — I2510 Atherosclerotic heart disease of native coronary artery without angina pectoris: Secondary | ICD-10-CM | POA: Diagnosis not present

## 2011-07-04 DIAGNOSIS — M810 Age-related osteoporosis without current pathological fracture: Secondary | ICD-10-CM

## 2011-07-04 LAB — COMPREHENSIVE METABOLIC PANEL
CO2: 29 mEq/L (ref 19–32)
Creatinine, Ser: 1.1 mg/dL (ref 0.4–1.2)
GFR: 52.75 mL/min — ABNORMAL LOW (ref >60.00)
Glucose, Bld: 98 mg/dL (ref 70–99)
Total Bilirubin: 0.8 mg/dL (ref 0.3–1.2)
Total Protein: 7.4 g/dL (ref 6.0–8.3)

## 2011-07-04 LAB — LIPID PANEL
Cholesterol: 123 mg/dL (ref 0–200)
HDL: 42.5 mg/dL (ref >39.00)
Triglycerides: 172 mg/dL — ABNORMAL HIGH (ref 0.0–149.0)

## 2011-07-05 LAB — VITAMIN D 25 HYDROXY (VIT D DEFICIENCY, FRACTURES): Vit D, 25-Hydroxy: 39 ng/mL (ref 30–89)

## 2011-07-09 ENCOUNTER — Ambulatory Visit (INDEPENDENT_AMBULATORY_CARE_PROVIDER_SITE_OTHER): Payer: Medicare Other | Admitting: Family Medicine

## 2011-07-09 ENCOUNTER — Encounter: Payer: Self-pay | Admitting: Family Medicine

## 2011-07-09 DIAGNOSIS — J069 Acute upper respiratory infection, unspecified: Secondary | ICD-10-CM

## 2011-07-09 DIAGNOSIS — M810 Age-related osteoporosis without current pathological fracture: Secondary | ICD-10-CM | POA: Diagnosis not present

## 2011-07-09 DIAGNOSIS — E7800 Pure hypercholesterolemia, unspecified: Secondary | ICD-10-CM

## 2011-07-09 DIAGNOSIS — I10 Essential (primary) hypertension: Secondary | ICD-10-CM | POA: Diagnosis not present

## 2011-07-09 MED ORDER — NITROGLYCERIN 0.4 MG SL SUBL: 0.4000 mg | SUBLINGUAL_TABLET | SUBLINGUAL | Status: DC | PRN

## 2011-07-09 NOTE — Patient Instructions (Addendum)
See Shirlee Limerick about your referral before you leave today. Check your bottles at home and call if you have any questions.   Take care.  But back on sweets and fatty foods.   Recheck BP at a visit in 6 months.  visit.

## 2011-07-09 NOTE — Progress Notes (Signed)
Recently getting over a cold.  Cough is better and she is improving.  Done with course of amoxil. She's noted an improvement over the last few days.  No fevers now.   Hypertension:  Using medication without problems or lightheadedness: yes Chest pain with exertion:no Edema:no Short of breath:no  Due for DEXA.  D/w pt.   Meds, vitals, and allergies reviewed.   PMH and SH reviewed  ROS: See HPI.  Otherwise negative.    GEN: nad, alert and oriented HEENT: mucous membranes moist, TM wnl, op wnl NECK: supple w/o LA CV: rrr. Murmur noted PULM: ctab, no inc wob ABD: soft, +bs EXT: no edema SKIN: no acute rash

## 2011-07-13 ENCOUNTER — Encounter: Payer: Self-pay | Admitting: Family Medicine

## 2011-07-13 NOTE — Assessment & Plan Note (Signed)
Continue current meds, work on diet for TGs.

## 2011-07-13 NOTE — Assessment & Plan Note (Signed)
PNA vaccine rx given to patient.  Continue current meds.

## 2011-07-13 NOTE — Assessment & Plan Note (Signed)
Due to fatty liver, work on lipids, diet and weight.

## 2011-07-13 NOTE — Assessment & Plan Note (Signed)
Resolving, improved, f/u prn.

## 2011-07-13 NOTE — Assessment & Plan Note (Addendum)
Refer for DXA. Vit D okay.

## 2011-07-18 ENCOUNTER — Ambulatory Visit
Admission: RE | Admit: 2011-07-18 | Discharge: 2011-07-18 | Disposition: A | Discharge status: Home / Self Care | Payer: Medicare Other | Source: Ambulatory Visit | Attending: Family Medicine | Admitting: Family Medicine

## 2011-07-18 DIAGNOSIS — Z780 Asymptomatic menopausal state: Secondary | ICD-10-CM

## 2011-07-18 DIAGNOSIS — M810 Age-related osteoporosis without current pathological fracture: Secondary | ICD-10-CM | POA: Diagnosis not present

## 2011-07-19 DIAGNOSIS — E782 Mixed hyperlipidemia: Secondary | ICD-10-CM | POA: Diagnosis not present

## 2011-07-19 DIAGNOSIS — I2510 Atherosclerotic heart disease of native coronary artery without angina pectoris: Secondary | ICD-10-CM | POA: Diagnosis not present

## 2011-07-24 DIAGNOSIS — E782 Mixed hyperlipidemia: Secondary | ICD-10-CM | POA: Diagnosis not present

## 2011-07-24 DIAGNOSIS — Z79899 Other long term (current) drug therapy: Secondary | ICD-10-CM | POA: Diagnosis not present

## 2011-07-24 DIAGNOSIS — R5381 Other malaise: Secondary | ICD-10-CM | POA: Diagnosis not present

## 2011-07-31 ENCOUNTER — Encounter: Payer: Self-pay | Admitting: Family Medicine

## 2011-07-31 ENCOUNTER — Ambulatory Visit (INDEPENDENT_AMBULATORY_CARE_PROVIDER_SITE_OTHER): Payer: Medicare Other | Admitting: Family Medicine

## 2011-07-31 DIAGNOSIS — M810 Age-related osteoporosis without current pathological fracture: Secondary | ICD-10-CM | POA: Diagnosis not present

## 2011-07-31 MED ORDER — ALENDRONATE SODIUM 70 MG PO TABS: 70.0000 mg | ORAL_TABLET | ORAL | Status: DC

## 2011-07-31 NOTE — Patient Instructions (Signed)
Start the fosamax, take it once a week and let me know if you have concerns.   Take care.

## 2011-08-01 ENCOUNTER — Encounter: Payer: Self-pay | Admitting: Family Medicine

## 2011-08-01 NOTE — Progress Notes (Signed)
Osteoporosis.  D/w pt and family about dxa and tx.  See plan.    Meds, vitals, and allergies reviewed.   ROS: See HPI.  Otherwise, noncontributory.  nad ncat Remainder deferred, >28min spent with patient.  See plan.

## 2011-08-01 NOTE — Assessment & Plan Note (Addendum)
On DXA 06/2011. Given her density, it is reasonable to start treatment.  Prev vit D okay.  D/w pt about calcium and vit D.  D/w pt about overall fx risk reduction but possible inc in atypical fx risk.  Benefit likely outweighs risk and she'll proceed.  Routine cautions re: GERD given, routine instructions given.  She understood. Consider DXA in ~2 years. >25 min spent with face to face with patient, >50% counseling and/or coordinating care.

## 2011-08-14 DIAGNOSIS — I2510 Atherosclerotic heart disease of native coronary artery without angina pectoris: Secondary | ICD-10-CM | POA: Diagnosis not present

## 2011-08-14 DIAGNOSIS — I10 Essential (primary) hypertension: Secondary | ICD-10-CM | POA: Diagnosis not present

## 2011-08-14 DIAGNOSIS — R079 Chest pain, unspecified: Secondary | ICD-10-CM | POA: Diagnosis not present

## 2011-08-28 ENCOUNTER — Ambulatory Visit (INDEPENDENT_AMBULATORY_CARE_PROVIDER_SITE_OTHER): Payer: Medicare Other | Admitting: Family Medicine

## 2011-08-28 ENCOUNTER — Telehealth: Payer: Self-pay | Admitting: Family Medicine

## 2011-08-28 ENCOUNTER — Encounter: Payer: Self-pay | Admitting: Family Medicine

## 2011-08-28 DIAGNOSIS — M549 Dorsalgia, unspecified: Secondary | ICD-10-CM

## 2011-08-28 NOTE — Telephone Encounter (Signed)
Please call about getting her on the schedule.  Thanks.

## 2011-08-28 NOTE — Telephone Encounter (Signed)
Patient was already scheduled to be seen.

## 2011-08-28 NOTE — Patient Instructions (Signed)
Use a heating pad on your back.  Skip this week with the fosamax and start back next week.  Let me know if you have concerns.

## 2011-08-28 NOTE — Progress Notes (Signed)
Back pain.  For last 3-4 days she's had some pain between the shoulder blades, better today.  Started fosamax about 1 month ago. She didn't know if it was related.  Pain could happen at rest.  It could continue during exertion.  Minimal heartburn, only slightly more than normal, but she had been off PPI. Would take tums when she did have heartburn and that would help. Wasn't SOB during the episodes.  Other than the occ heartburn, no SSCP and no SSCP that was exertional.  No vomiting, no diarrhea, no fevers.  No trauma, no falls.  No triggers known. She has trouble characterizing the pain, other than it was constant prev.   Meds, vitals, and allergies reviewed.   ROS: See HPI.  Otherwise, noncontributory.  nad ncat Mmm No teeth, minimal irritation from the rub of dentures noted- this has happened prev and is similar Rrr, murmur noted ctab Paraspinal muscles in mid T spine tender, mild No rash abd benign

## 2011-08-28 NOTE — Telephone Encounter (Signed)
Triage Record Num: [id number] Operator: Estevan Oaks Patient Name: [name] Call Date & Time: 08/27/2011 6:39:32PM Patient Phone: [phone] PCP: Crawford Givens Patient Gender: Female PCP Fax : Patient DOB: [birth year] Practice Name[facility] Reason for Call: Caller: [name]/Patient; PCP: Crawford Givens Clelia Croft); CB#: [phone]; Call regarding Back Pain for "a couple days ago or so." Pain is between shoulders. Pain is mild to mod. Has taken Tramadol; gives little relief. Afebrile. Occ SOB and says SOB and chest pain "not really but sometimes I feel a little different in there." Sts this has resolved in last 1 week. Pt is very pleasant, laughs easily and w/o distress. No appts available. Rn adv'd to call back in AM to request appt. Agrees. Protocol(s) Used: Back Symptoms Recommended Outcome per Protocol: See Provider within 24 hours Reason for Outcome: Pain intensifies with coughing, sneezing or straining Care Advice: ~ Avoid heavy lifting, bending and twisting of the back, and prolonged sitting until evaluated by provider. 08/27/2011 7:01:01PM Page 1 of 1 [email or web address]

## 2011-08-28 NOTE — Telephone Encounter (Signed)
Could not reach pt by phone.  Left message to return call.

## 2011-08-29 DIAGNOSIS — M549 Dorsalgia, unspecified: Secondary | ICD-10-CM | POA: Insufficient documentation

## 2011-08-29 NOTE — Assessment & Plan Note (Signed)
Likely MSK source, heat and stretching.  Unlikely to be related to fosamax.  She can hold the next dose and then restart next week just to be sure.  She agrees.  Benign exam.

## 2011-09-14 DIAGNOSIS — H2510 Age-related nuclear cataract, unspecified eye: Secondary | ICD-10-CM | POA: Diagnosis not present

## 2011-11-05 DIAGNOSIS — Z79899 Other long term (current) drug therapy: Secondary | ICD-10-CM | POA: Diagnosis not present

## 2011-11-05 DIAGNOSIS — E782 Mixed hyperlipidemia: Secondary | ICD-10-CM | POA: Diagnosis not present

## 2011-11-13 ENCOUNTER — Other Ambulatory Visit (HOSPITAL_COMMUNITY): Payer: Self-pay | Admitting: Cardiovascular Disease

## 2011-11-13 DIAGNOSIS — I359 Nonrheumatic aortic valve disorder, unspecified: Secondary | ICD-10-CM | POA: Diagnosis not present

## 2011-11-13 DIAGNOSIS — R7989 Other specified abnormal findings of blood chemistry: Secondary | ICD-10-CM | POA: Diagnosis not present

## 2011-11-13 DIAGNOSIS — Z45018 Encounter for adjustment and management of other part of cardiac pacemaker: Secondary | ICD-10-CM | POA: Diagnosis not present

## 2011-11-13 DIAGNOSIS — Z79899 Other long term (current) drug therapy: Secondary | ICD-10-CM | POA: Diagnosis not present

## 2011-11-20 ENCOUNTER — Ambulatory Visit (HOSPITAL_COMMUNITY)
Admission: RE | Admit: 2011-11-20 | Discharge: 2011-11-20 | Disposition: A | Discharge status: Home / Self Care | Payer: Medicare Other | Source: Ambulatory Visit | Attending: Cardiovascular Disease | Admitting: Cardiovascular Disease

## 2011-11-20 DIAGNOSIS — R945 Abnormal results of liver function studies: Secondary | ICD-10-CM | POA: Insufficient documentation

## 2011-11-26 DIAGNOSIS — I359 Nonrheumatic aortic valve disorder, unspecified: Secondary | ICD-10-CM | POA: Diagnosis not present

## 2011-11-26 HISTORY — PX: US ECHOCARDIOGRAPHY: HXRAD669

## 2011-11-28 ENCOUNTER — Encounter: Payer: Self-pay | Admitting: Family Medicine

## 2011-11-28 DIAGNOSIS — R011 Cardiac murmur, unspecified: Secondary | ICD-10-CM | POA: Insufficient documentation

## 2012-01-01 DIAGNOSIS — E782 Mixed hyperlipidemia: Secondary | ICD-10-CM | POA: Diagnosis not present

## 2012-01-01 DIAGNOSIS — R6889 Other general symptoms and signs: Secondary | ICD-10-CM | POA: Diagnosis not present

## 2012-01-01 DIAGNOSIS — Z79899 Other long term (current) drug therapy: Secondary | ICD-10-CM | POA: Diagnosis not present

## 2012-01-08 ENCOUNTER — Ambulatory Visit: Payer: Medicare Other | Admitting: Family Medicine

## 2012-01-10 ENCOUNTER — Ambulatory Visit: Payer: Medicare Other | Admitting: Family Medicine

## 2012-01-15 ENCOUNTER — Other Ambulatory Visit: Payer: Self-pay | Admitting: Family Medicine

## 2012-01-15 DIAGNOSIS — Z1231 Encounter for screening mammogram for malignant neoplasm of breast: Secondary | ICD-10-CM

## 2012-01-31 ENCOUNTER — Encounter: Payer: Self-pay | Admitting: Family Medicine

## 2012-01-31 ENCOUNTER — Ambulatory Visit (INDEPENDENT_AMBULATORY_CARE_PROVIDER_SITE_OTHER): Payer: Medicare Other | Admitting: Family Medicine

## 2012-01-31 VITALS — BP 130/66 | HR 67 | Temp 98.3°F | Wt 130.0 lb

## 2012-01-31 DIAGNOSIS — I2510 Atherosclerotic heart disease of native coronary artery without angina pectoris: Secondary | ICD-10-CM | POA: Diagnosis not present

## 2012-01-31 DIAGNOSIS — R195 Other fecal abnormalities: Secondary | ICD-10-CM

## 2012-01-31 DIAGNOSIS — I10 Essential (primary) hypertension: Secondary | ICD-10-CM

## 2012-01-31 NOTE — Patient Instructions (Addendum)
I would get a physical next summer. Take care.   I would get a flu shot each fall.   I'll await your notes from the cardiology appointment tomorrow.  Try the metamucil daily.  If not improved, then notify me.

## 2012-01-31 NOTE — Progress Notes (Signed)
H/o elevated LFTs and was taking off statin by cards.  Has f/u with them about this tomorrow.  Per patient, her prelim report was okay.  She has u/s done and no focal changes other than some likely fatty infiltration.    Hypertension:    Using medication without problems or lightheadedness: yes Chest pain with exertion:no Edema:no Short of breath:no  Last few months, she'll have change in BMs in the AM.  Frequency of BMs with loose stools in AM only.  No blood in stool.  She has tied metamucil intermittently.  No abd pain.   Meds, vitals, and allergies reviewed.   PMH and SH reviewed  ROS: See HPI.  Otherwise negative.    GEN: nad, alert and oriented HEENT: mucous membranes moist NECK: supple w/o LA CV: rrr.  Murmur noted.  PULM: ctab, no inc wob ABD: soft, +bs EXT: no edema SKIN: no acute rash

## 2012-02-01 DIAGNOSIS — R195 Other fecal abnormalities: Secondary | ICD-10-CM | POA: Insufficient documentation

## 2012-02-01 DIAGNOSIS — I2510 Atherosclerotic heart disease of native coronary artery without angina pectoris: Secondary | ICD-10-CM | POA: Diagnosis not present

## 2012-02-01 DIAGNOSIS — R5383 Other fatigue: Secondary | ICD-10-CM | POA: Diagnosis not present

## 2012-02-01 DIAGNOSIS — E782 Mixed hyperlipidemia: Secondary | ICD-10-CM | POA: Diagnosis not present

## 2012-02-01 DIAGNOSIS — Z79899 Other long term (current) drug therapy: Secondary | ICD-10-CM | POA: Diagnosis not present

## 2012-02-01 NOTE — Assessment & Plan Note (Signed)
U/s notes reviewed.  No juandice and RUQ not ttp.  I'll await labs from cardiology.  Off statin for now.

## 2012-02-01 NOTE — Assessment & Plan Note (Signed)
Per cards.  I'll await consult note.

## 2012-02-01 NOTE — Assessment & Plan Note (Signed)
Benign exam. Add on metamucil consistently and f/u prn. She agrees.

## 2012-02-01 NOTE — Assessment & Plan Note (Signed)
Controlled, continue current meds.   

## 2012-02-19 ENCOUNTER — Other Ambulatory Visit: Payer: Self-pay

## 2012-02-19 NOTE — Telephone Encounter (Signed)
[facility] faxed request Tramadol. Last filled 11/20/11.Please advise.

## 2012-02-20 ENCOUNTER — Ambulatory Visit
Admission: RE | Admit: 2012-02-20 | Discharge: 2012-02-20 | Disposition: A | Discharge status: Home / Self Care | Payer: 59 | Source: Ambulatory Visit | Attending: Family Medicine | Admitting: Family Medicine

## 2012-02-20 DIAGNOSIS — Z1231 Encounter for screening mammogram for malignant neoplasm of breast: Secondary | ICD-10-CM

## 2012-02-20 MED ORDER — TRAMADOL HCL 50 MG PO TABS: 50.0000 mg | ORAL_TABLET | Freq: Three times a day (TID) | ORAL | Status: DC | PRN

## 2012-02-20 NOTE — Telephone Encounter (Signed)
Sent!

## 2012-02-21 ENCOUNTER — Encounter: Payer: Self-pay | Admitting: *Deleted

## 2012-04-25 DIAGNOSIS — I10 Essential (primary) hypertension: Secondary | ICD-10-CM | POA: Diagnosis not present

## 2012-04-25 DIAGNOSIS — R079 Chest pain, unspecified: Secondary | ICD-10-CM | POA: Diagnosis not present

## 2012-04-25 DIAGNOSIS — R7989 Other specified abnormal findings of blood chemistry: Secondary | ICD-10-CM | POA: Diagnosis not present

## 2012-04-25 DIAGNOSIS — I4891 Unspecified atrial fibrillation: Secondary | ICD-10-CM | POA: Diagnosis not present

## 2012-05-13 ENCOUNTER — Ambulatory Visit (INDEPENDENT_AMBULATORY_CARE_PROVIDER_SITE_OTHER): Payer: Medicare Other | Admitting: Family Medicine

## 2012-05-13 ENCOUNTER — Encounter: Payer: Self-pay | Admitting: Family Medicine

## 2012-05-13 VITALS — BP 130/60 | HR 77 | Temp 97.6°F | Wt 128.0 lb

## 2012-05-13 DIAGNOSIS — Z23 Encounter for immunization: Secondary | ICD-10-CM | POA: Diagnosis not present

## 2012-05-13 DIAGNOSIS — M79609 Pain in unspecified limb: Secondary | ICD-10-CM

## 2012-05-13 DIAGNOSIS — M79669 Pain in unspecified lower leg: Secondary | ICD-10-CM

## 2012-05-13 NOTE — Patient Instructions (Addendum)
Go to the lab on the way out.  We'll contact you with your lab report.  Take the tramadol 3 times a day.  If that doesn't help, then notify us.  Take care.

## 2012-05-13 NOTE — Progress Notes (Signed)
L leg pain.  Longstanding h/o L knee pain, anterior.  Over the last 2 months, she had done some tile work in the bathroom. Now with shin and lower leg pain over the last 2 months.  She had some bruising over the distal/medial shin.  Pain is a 'pulling and cracking' in the knee.  Pain walking.  Less pain if using a pillow between the knees laying down.  No trauma o/w, no falls. No SOB, CP.   Meds, vitals, and allergies reviewed.   ROS: See HPI.  Otherwise, noncontributory.  nad ncat L knee with chronic appearing joint line changes and what appear to be a puffy but not red bursa inferior/medially on anterior side of knee.  Crepitus on ROM of knee but no locking.  Medial and lateral joint line is ttp.  No erythema of the joint itself.   Normal ROM of the L ankle and foot.  DP pulse wnl Achilles not ttp but calf and shin are diffusely sore on palpation w/o cords, erythema

## 2012-05-14 ENCOUNTER — Telehealth: Payer: Self-pay | Admitting: Family Medicine

## 2012-05-14 ENCOUNTER — Encounter (INDEPENDENT_AMBULATORY_CARE_PROVIDER_SITE_OTHER): Payer: Medicare Other

## 2012-05-14 DIAGNOSIS — M79609 Pain in unspecified limb: Secondary | ICD-10-CM

## 2012-05-14 DIAGNOSIS — M79669 Pain in unspecified lower leg: Secondary | ICD-10-CM | POA: Insufficient documentation

## 2012-05-14 DIAGNOSIS — M7989 Other specified soft tissue disorders: Secondary | ICD-10-CM | POA: Diagnosis not present

## 2012-05-14 LAB — D-DIMER, QUANTITATIVE: D-Dimer, Quant: 1.29 ug/mL-FEU — ABNORMAL HIGH (ref 0.00–0.48)

## 2012-05-14 NOTE — Telephone Encounter (Signed)
Gina with [facility] called preliminary report; negative DVT left leg. Pt waiting.Please advise.

## 2012-05-14 NOTE — Assessment & Plan Note (Signed)
Likely OA flare in the knee with muscle strain diffusely in the lower leg.  Would check D dimer but DVT risk is low.   Would take tid tramadol in meantime, she was only taking it 1-2 times a day.    Addendum.  D dimer up, will arrange for doppler to eval for DVT.  See notes on labs.

## 2012-05-14 NOTE — Telephone Encounter (Signed)
Called patient and got no answer. Called daughter Sheralyn Boatman and she will leave work to bring her mother to [employer] in [place] for LE Venous today at 1:15pm. Call report requested to our office back line phone number.

## 2012-05-14 NOTE — Telephone Encounter (Signed)
Dr Para March said to tell pt no DVT; likely muscle strain or arthritis; take Tramadol and if not better call Dr Para March back. Pt voiced understanding and requested me to tell Sheralyn Boatman her daughter also. Sheralyn Boatman notified as well.

## 2012-05-19 ENCOUNTER — Telehealth: Payer: Self-pay

## 2012-05-19 NOTE — Telephone Encounter (Signed)
Last set we have was from 1/13. They would likely want to repeat it given the duration.

## 2012-05-19 NOTE — Telephone Encounter (Signed)
pts daughter left v/m; pt recently saw cardiologist and cardiolgist wanted pt to have liver function test. Pt daughter wanted to know if Dr Para March had liver function test done at pts physical. Sheralyn Boatman request call back.

## 2012-05-20 NOTE — Telephone Encounter (Signed)
LMOVM of call back number. 

## 2012-05-28 ENCOUNTER — Other Ambulatory Visit: Payer: Self-pay | Admitting: Family Medicine

## 2012-05-28 NOTE — Telephone Encounter (Signed)
Received refill request electronically. Last office visit 05/13/12, acute. Is it okay to refill medication?

## 2012-05-28 NOTE — Telephone Encounter (Signed)
Sent. Thanks.   

## 2012-05-29 ENCOUNTER — Other Ambulatory Visit: Payer: Self-pay | Admitting: Gastroenterology

## 2012-06-03 ENCOUNTER — Ambulatory Visit
Admission: RE | Admit: 2012-06-03 | Discharge: 2012-06-03 | Disposition: A | Discharge status: Home / Self Care | Payer: Medicare Other | Source: Ambulatory Visit | Attending: Gastroenterology | Admitting: Gastroenterology

## 2012-06-03 DIAGNOSIS — K7689 Other specified diseases of liver: Secondary | ICD-10-CM | POA: Diagnosis not present

## 2012-06-04 ENCOUNTER — Encounter: Payer: Self-pay | Admitting: Family Medicine

## 2012-06-04 DIAGNOSIS — K760 Fatty (change of) liver, not elsewhere classified: Secondary | ICD-10-CM | POA: Insufficient documentation

## 2012-08-01 DIAGNOSIS — H2510 Age-related nuclear cataract, unspecified eye: Secondary | ICD-10-CM | POA: Diagnosis not present

## 2012-08-14 ENCOUNTER — Other Ambulatory Visit (HOSPITAL_COMMUNITY): Payer: Self-pay | Admitting: Cardiovascular Disease

## 2012-08-14 DIAGNOSIS — R0989 Other specified symptoms and signs involving the circulatory and respiratory systems: Secondary | ICD-10-CM

## 2012-08-14 DIAGNOSIS — R5381 Other malaise: Secondary | ICD-10-CM | POA: Diagnosis not present

## 2012-08-14 DIAGNOSIS — K219 Gastro-esophageal reflux disease without esophagitis: Secondary | ICD-10-CM | POA: Diagnosis not present

## 2012-08-14 DIAGNOSIS — I359 Nonrheumatic aortic valve disorder, unspecified: Secondary | ICD-10-CM | POA: Diagnosis not present

## 2012-08-14 DIAGNOSIS — I2510 Atherosclerotic heart disease of native coronary artery without angina pectoris: Secondary | ICD-10-CM | POA: Diagnosis not present

## 2012-08-14 DIAGNOSIS — I350 Nonrheumatic aortic (valve) stenosis: Secondary | ICD-10-CM

## 2012-08-14 DIAGNOSIS — E782 Mixed hyperlipidemia: Secondary | ICD-10-CM | POA: Diagnosis not present

## 2012-08-14 DIAGNOSIS — R6889 Other general symptoms and signs: Secondary | ICD-10-CM | POA: Diagnosis not present

## 2012-08-14 DIAGNOSIS — I498 Other specified cardiac arrhythmias: Secondary | ICD-10-CM | POA: Diagnosis not present

## 2012-08-14 DIAGNOSIS — R079 Chest pain, unspecified: Secondary | ICD-10-CM

## 2012-08-19 ENCOUNTER — Ambulatory Visit (HOSPITAL_COMMUNITY)
Admission: RE | Admit: 2012-08-19 | Discharge: 2012-08-19 | Disposition: A | Discharge status: Home / Self Care | Payer: Medicare Other | Source: Ambulatory Visit | Attending: Cardiovascular Disease | Admitting: Cardiovascular Disease

## 2012-08-19 DIAGNOSIS — I658 Occlusion and stenosis of other precerebral arteries: Secondary | ICD-10-CM | POA: Insufficient documentation

## 2012-08-19 DIAGNOSIS — I6529 Occlusion and stenosis of unspecified carotid artery: Secondary | ICD-10-CM | POA: Insufficient documentation

## 2012-08-19 DIAGNOSIS — I10 Essential (primary) hypertension: Secondary | ICD-10-CM | POA: Diagnosis not present

## 2012-08-19 DIAGNOSIS — R42 Dizziness and giddiness: Secondary | ICD-10-CM | POA: Insufficient documentation

## 2012-08-19 DIAGNOSIS — R0989 Other specified symptoms and signs involving the circulatory and respiratory systems: Secondary | ICD-10-CM

## 2012-08-19 DIAGNOSIS — E785 Hyperlipidemia, unspecified: Secondary | ICD-10-CM | POA: Diagnosis not present

## 2012-08-19 DIAGNOSIS — I350 Nonrheumatic aortic (valve) stenosis: Secondary | ICD-10-CM

## 2012-08-19 NOTE — Progress Notes (Signed)
Carotid Duplex Complete [name] 

## 2012-08-21 ENCOUNTER — Ambulatory Visit (HOSPITAL_COMMUNITY)
Admission: RE | Admit: 2012-08-21 | Discharge: 2012-08-21 | Disposition: A | Discharge status: Home / Self Care | Payer: Medicare Other | Source: Ambulatory Visit | Attending: Cardiovascular Disease | Admitting: Cardiovascular Disease

## 2012-08-21 DIAGNOSIS — Z8249 Family history of ischemic heart disease and other diseases of the circulatory system: Secondary | ICD-10-CM | POA: Diagnosis not present

## 2012-08-21 DIAGNOSIS — E785 Hyperlipidemia, unspecified: Secondary | ICD-10-CM | POA: Insufficient documentation

## 2012-08-21 DIAGNOSIS — R42 Dizziness and giddiness: Secondary | ICD-10-CM | POA: Insufficient documentation

## 2012-08-21 DIAGNOSIS — I10 Essential (primary) hypertension: Secondary | ICD-10-CM | POA: Insufficient documentation

## 2012-08-21 DIAGNOSIS — R0602 Shortness of breath: Secondary | ICD-10-CM | POA: Diagnosis not present

## 2012-08-21 DIAGNOSIS — R079 Chest pain, unspecified: Secondary | ICD-10-CM | POA: Diagnosis not present

## 2012-08-21 MED ORDER — REGADENOSON 0.4 MG/5ML IV SOLN
0.4000 mg | Freq: Once | INTRAVENOUS | Status: AC
  Administered 2012-08-21: 0.4 mg via INTRAVENOUS

## 2012-08-21 MED ORDER — TECHNETIUM TC 99M SESTAMIBI GENERIC - CARDIOLITE
10.0000 | Freq: Once | INTRAVENOUS | Status: AC | PRN
  Administered 2012-08-21: 10 via INTRAVENOUS

## 2012-08-21 MED ORDER — TECHNETIUM TC 99M SESTAMIBI GENERIC - CARDIOLITE
30.0000 | Freq: Once | INTRAVENOUS | Status: AC | PRN
  Administered 2012-08-21: 30 via INTRAVENOUS

## 2012-08-21 MED ORDER — AMINOPHYLLINE 25 MG/ML IV SOLN
150.0000 mg | Freq: Once | INTRAVENOUS | Status: AC
  Administered 2012-08-21: 150 mg via INTRAVENOUS

## 2012-08-21 NOTE — Procedures (Addendum)
[facility] [address] [place] [address] [phone]  Cardiology Nuclear Med Study  [name] is a 79 y.o. female     MRN : [id number]     DOB: 11/02/1927  Procedure Date: 08/21/2012  Nuclear Med Background Indication for Stress Test:  Stent Patency History:  CAD;MI-08/2006;PTCA/STENT-08/2006 AND 11/2006;PACEMAKER Cardiac Risk Factors: Family History - CAD, Hypertension and Lipids  Symptoms:  Chest Pain, Dizziness, Light-Headedness and SOB   Nuclear Pre-Procedure Caffeine/Decaff Intake:  7:00pm NPO After: 5:00am   IV Site: R Forearm  IV 0.9% NS with Angio Cath:  22g  Chest Size (in):  N/A IV Started by: Emmit Pomfret, RN  Height: 5' (1.524 m)  Cup Size: B  BMI:  Body mass index is 24.61 kg/(m^2). Weight:  126 lb (57.153 kg)   Tech Comments:  N/A    Nuclear Med Study 1 or 2 day study: 1 day  Stress Test Type:  Lexiscan  Order Authorizing Provider:  Hazeline Junker   Resting Radionuclide: Technetium 34m Sestamibi  Resting Radionuclide Dose: 10.4 mCi   Stress Radionuclide:  Technetium 62m Sestamibi  Stress Radionuclide Dose: 31.0 mCi           Stress Protocol Rest HR:70 Stress HR: 85  Rest BP: 147/77 Stress BP: 164/72  Exercise Time (min): n/a METS: n/a   Predicted Max HR: 137 bpm % Max HR: 62.04 bpm Rate Pressure Product: 21308  Dose of Adenosine (mg):  n/a Dose of Lexiscan: 0.4 mg  Dose of Atropine (mg): n/a Dose of Dobutamine: n/a mcg/kg/min (at max HR)  Stress Test Technologist: Ernestene Mention, CCT Nuclear Technologist: Koren Shiver, CNMT   Rest Procedure:  Myocardial perfusion imaging was performed at rest 45 minutes following the intravenous administration of Technetium 36m Sestamibi. Stress Procedure:  The patient received IV Lexiscan 0.4 mg over 15-seconds.  Technetium 85m Sestamibi injected at 30-seconds.  Due to patient's shortness of breath, she was given IV Aminophylline 75 mg. Symptom  was resolved.There were no significant changes with Lexiscan.  Quantitative spect images were obtained after a 45 minute delay.  Transient Ischemic Dilatation (Normal <1.22):  0.82 Lung/Heart Ratio (Normal <0.45):  0.33 QGS EDV:  32 ml QGS ESV:  4 ml LV Ejection Fraction: 89%  Rest ECG: NSR - Normal EKG  Stress ECG: No significant change from baseline ECG  QPS Raw Data Images:  Normal; no motion artifact; normal heart/lung ratio. Stress Images:  Normal homogeneous uptake in all areas of the myocardium. Rest Images:  Normal homogeneous uptake in all areas of the myocardium. Subtraction (SDS):  No evidence of ischemia.  Impression Exercise Capacity:  Lexiscan with no exercise. BP Response:  Hypotensive blood pressure response. Clinical Symptoms:  Flushed, short of breath temporarily ECG Impression:  No significant ECG changes with Lexiscan. Comparison with Prior Nuclear Study: No significant change from previous study  Overall Impression:  Low risk stress nuclear study. No reversible ischemia.  LV Wall Motion:  NL LV Function; NL Wall Motion; EF 89%.  [name], MD, [facility] Board Certified in Nuclear Cardiology Attending Cardiologist [facility]  [name], MD  08/21/2012 1:15 PM

## 2012-08-22 DIAGNOSIS — R079 Chest pain, unspecified: Secondary | ICD-10-CM | POA: Diagnosis not present

## 2012-09-04 DIAGNOSIS — H20029 Recurrent acute iridocyclitis, unspecified eye: Secondary | ICD-10-CM | POA: Diagnosis not present

## 2012-09-11 DIAGNOSIS — H20029 Recurrent acute iridocyclitis, unspecified eye: Secondary | ICD-10-CM | POA: Diagnosis not present

## 2012-10-09 DIAGNOSIS — H20029 Recurrent acute iridocyclitis, unspecified eye: Secondary | ICD-10-CM | POA: Diagnosis not present

## 2012-11-12 DIAGNOSIS — H20029 Recurrent acute iridocyclitis, unspecified eye: Secondary | ICD-10-CM | POA: Diagnosis not present

## 2012-12-02 ENCOUNTER — Telehealth: Payer: Self-pay | Admitting: Cardiovascular Disease

## 2012-12-02 NOTE — Telephone Encounter (Signed)
Also she needs another one for Valsartan-80/12.5 mg

## 2012-12-02 NOTE — Telephone Encounter (Signed)
Returned call.  Pt stated she needs refills on alprazolam and valsartan.  Pt also stated alprazolam has increased and she wants to know if something less expensive can be rx'd.  Pt also wants to know if there is something less expensive for the valsartan.  Pt informed Dr. Alanda Amass is out of the office until Thursday and will be notified.  Confirmed w/ pt that she has enough meds until Thursday.  Pt verbalized understanding and agreed w/ plan.  Pt will call back if she runs out of alprazolam before Thursday.  Message forwarded to [facility]. Berlinda Last, LPN to discuss w/ Dr. Alanda Amass.  This note and paper chart# [id number] placed on Dr. Kandis Cocking cart.

## 2012-12-02 NOTE — Telephone Encounter (Signed)
[name] is calling because she is needing something less costly than her Alprazolam-.25mg . Her insurance has put it in a different category and it is now more costly than before .Marland Kitchen Please call  Thanks

## 2012-12-04 NOTE — Telephone Encounter (Signed)
Informed pt. Dr. Alanda Amass changed her valsartan hct to losartan hct and her xanax to buspar. Pt. Stated understanding of med changes

## 2012-12-09 DIAGNOSIS — K7689 Other specified diseases of liver: Secondary | ICD-10-CM | POA: Diagnosis not present

## 2012-12-09 DIAGNOSIS — K591 Functional diarrhea: Secondary | ICD-10-CM | POA: Diagnosis not present

## 2012-12-09 DIAGNOSIS — Z8371 Family history of colonic polyps: Secondary | ICD-10-CM | POA: Diagnosis not present

## 2013-01-15 ENCOUNTER — Other Ambulatory Visit: Payer: Self-pay | Admitting: Family Medicine

## 2013-01-15 DIAGNOSIS — Z1231 Encounter for screening mammogram for malignant neoplasm of breast: Secondary | ICD-10-CM

## 2013-02-25 ENCOUNTER — Ambulatory Visit
Admission: RE | Admit: 2013-02-25 | Discharge: 2013-02-25 | Disposition: A | Discharge status: Home / Self Care | Payer: Medicare Other | Source: Ambulatory Visit | Attending: Family Medicine | Admitting: Family Medicine

## 2013-02-25 DIAGNOSIS — Z1231 Encounter for screening mammogram for malignant neoplasm of breast: Secondary | ICD-10-CM | POA: Diagnosis not present

## 2013-02-26 ENCOUNTER — Encounter: Payer: Self-pay | Admitting: *Deleted

## 2013-03-11 ENCOUNTER — Other Ambulatory Visit: Payer: Self-pay | Admitting: Cardiovascular Disease

## 2013-03-11 ENCOUNTER — Other Ambulatory Visit: Payer: Self-pay

## 2013-03-11 DIAGNOSIS — I2510 Atherosclerotic heart disease of native coronary artery without angina pectoris: Secondary | ICD-10-CM | POA: Diagnosis not present

## 2013-03-11 DIAGNOSIS — R6889 Other general symptoms and signs: Secondary | ICD-10-CM | POA: Diagnosis not present

## 2013-03-11 DIAGNOSIS — R5381 Other malaise: Secondary | ICD-10-CM | POA: Diagnosis not present

## 2013-03-11 DIAGNOSIS — I4891 Unspecified atrial fibrillation: Secondary | ICD-10-CM | POA: Diagnosis not present

## 2013-03-11 DIAGNOSIS — E782 Mixed hyperlipidemia: Secondary | ICD-10-CM | POA: Diagnosis not present

## 2013-03-11 LAB — CBC WITH DIFFERENTIAL/PLATELET
Basophils Absolute: 0 10*3/uL (ref 0.0–0.1)
Eosinophils Relative: 4 % (ref 0–5)
HCT: 40.5 % (ref 36.0–46.0)
Lymphocytes Relative: 36 % (ref 12–46)
MCH: 29.9 pg (ref 26.0–34.0)
MCHC: 34.1 g/dL (ref 30.0–36.0)
MCV: 87.7 fL (ref 78.0–100.0)
Monocytes Absolute: 0.7 10*3/uL (ref 0.1–1.0)
Neutrophils Relative %: 49 % (ref 43–77)
RDW: 14.1 % (ref 11.5–15.5)

## 2013-03-11 LAB — PACEMAKER DEVICE OBSERVATION
AL AMPLITUDE: 1.6 mv
AL IMPEDENCE PM: 363 Ohm
AL THRESHOLD: 0.7 V
BATTERY VOLTAGE: 2.78 V

## 2013-03-11 LAB — LIPID PANEL
Cholesterol: 212 mg/dL — ABNORMAL HIGH (ref 0–200)
HDL: 44 mg/dL (ref >39)
Total CHOL/HDL Ratio: 4.8 Ratio

## 2013-03-11 LAB — COMPREHENSIVE METABOLIC PANEL
AST: 57 U/L — ABNORMAL HIGH (ref 0–37)
BUN: 22 mg/dL (ref 6–23)
Calcium: 9.7 mg/dL (ref 8.4–10.5)
Chloride: 101 mEq/L (ref 96–112)
Creat: 1.19 mg/dL — ABNORMAL HIGH (ref 0.50–1.10)

## 2013-03-19 ENCOUNTER — Encounter: Payer: Self-pay | Admitting: Cardiovascular Disease

## 2013-03-20 ENCOUNTER — Telehealth: Payer: Self-pay | Admitting: Cardiovascular Disease

## 2013-03-20 NOTE — Telephone Encounter (Signed)
Spoke with patient notifying her per amber's note that she should be taking the losartan/hct, not the valsartan-hct. She then has a question about her cholesterol medication .Informed her that some discussion was mentioned in Dr. Kandis Cocking last note but there was nothing decisive. Will leave a message  For Dr. Alanda Amass to advise.

## 2013-03-20 NOTE — Telephone Encounter (Signed)
Saw Dr. Alanda Amass recently and cannot remember if she needs to continue her Diovan

## 2013-03-20 NOTE — Telephone Encounter (Signed)
Diovan/HCT was dc'd June 2014 and Losartan/HCT 50/12.5 mg was started per chart.

## 2013-03-20 NOTE — Telephone Encounter (Signed)
Returned call.  Left message to call back before 4pm.  

## 2013-03-20 NOTE — Telephone Encounter (Signed)
Patient is returning Amber's call

## 2013-03-24 ENCOUNTER — Encounter: Payer: Self-pay | Admitting: Cardiovascular Disease

## 2013-03-25 ENCOUNTER — Other Ambulatory Visit (HOSPITAL_COMMUNITY): Payer: Self-pay | Admitting: Cardiovascular Disease

## 2013-03-25 ENCOUNTER — Ambulatory Visit (HOSPITAL_COMMUNITY)
Admission: RE | Admit: 2013-03-25 | Discharge: 2013-03-25 | Disposition: A | Discharge status: Home / Self Care | Payer: Medicare Other | Source: Ambulatory Visit | Attending: Cardiology | Admitting: Cardiology

## 2013-03-25 DIAGNOSIS — I10 Essential (primary) hypertension: Secondary | ICD-10-CM | POA: Insufficient documentation

## 2013-03-25 DIAGNOSIS — I059 Rheumatic mitral valve disease, unspecified: Secondary | ICD-10-CM | POA: Diagnosis not present

## 2013-03-25 DIAGNOSIS — I379 Nonrheumatic pulmonary valve disorder, unspecified: Secondary | ICD-10-CM | POA: Insufficient documentation

## 2013-03-25 DIAGNOSIS — I359 Nonrheumatic aortic valve disorder, unspecified: Secondary | ICD-10-CM | POA: Diagnosis not present

## 2013-03-25 DIAGNOSIS — I079 Rheumatic tricuspid valve disease, unspecified: Secondary | ICD-10-CM | POA: Diagnosis not present

## 2013-03-25 DIAGNOSIS — I319 Disease of pericardium, unspecified: Secondary | ICD-10-CM | POA: Insufficient documentation

## 2013-03-25 DIAGNOSIS — Q251 Coarctation of aorta: Secondary | ICD-10-CM

## 2013-03-25 NOTE — Progress Notes (Signed)
2D Echo Performed 03/25/2013    Neco Kling, RCS  

## 2013-03-27 ENCOUNTER — Other Ambulatory Visit: Payer: Self-pay | Admitting: Cardiovascular Disease

## 2013-03-27 DIAGNOSIS — R945 Abnormal results of liver function studies: Secondary | ICD-10-CM | POA: Diagnosis not present

## 2013-03-30 LAB — HEPATITIS PANEL, ACUTE
HCV Ab: NEGATIVE
Hep A IgM: NEGATIVE
Hep B C IgM: NEGATIVE

## 2013-04-01 ENCOUNTER — Other Ambulatory Visit: Payer: Self-pay | Admitting: *Deleted

## 2013-04-01 MED ORDER — CLOPIDOGREL BISULFATE 75 MG PO TABS: 75.0000 mg | ORAL_TABLET | Freq: Every day | ORAL | Status: DC

## 2013-04-01 MED ORDER — METOPROLOL TARTRATE 25 MG PO TABS: 25.0000 mg | ORAL_TABLET | Freq: Two times a day (BID) | ORAL | Status: DC

## 2013-04-01 NOTE — Telephone Encounter (Signed)
Medication refills faxed on 04/01/13

## 2013-04-03 ENCOUNTER — Other Ambulatory Visit: Payer: Self-pay | Admitting: *Deleted

## 2013-04-30 ENCOUNTER — Telehealth: Payer: Self-pay | Admitting: Internal Medicine

## 2013-04-30 NOTE — Telephone Encounter (Signed)
Pt has recall for dr c for march/mt

## 2013-05-25 IMAGING — MG MM DIGITAL SCREENING BILAT W/ CAD
6 series · 6 of 6 positions shown · non-contrast
Comparison: Previous exams.

CLINICAL DATA: Screening.

DIGITAL BILATERAL SCREENING MAMMOGRAM WITH CAD

[R CC]
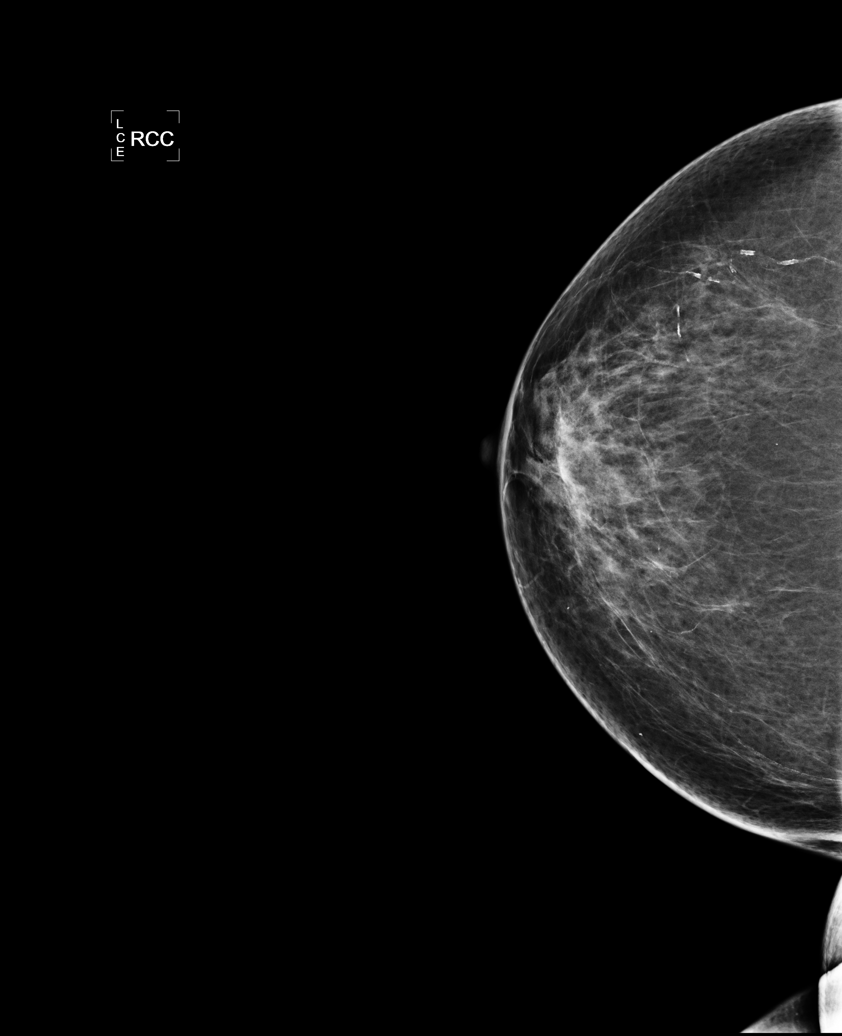

[L CC (1 of 2)]
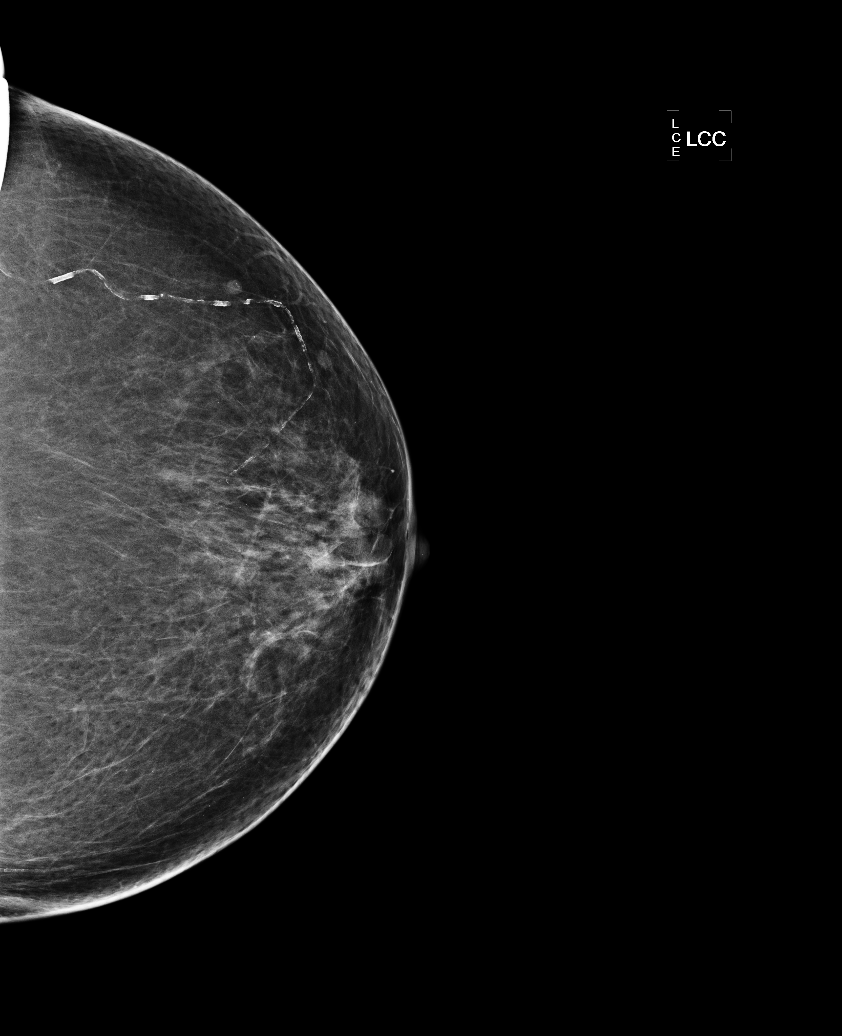

[L MLO]
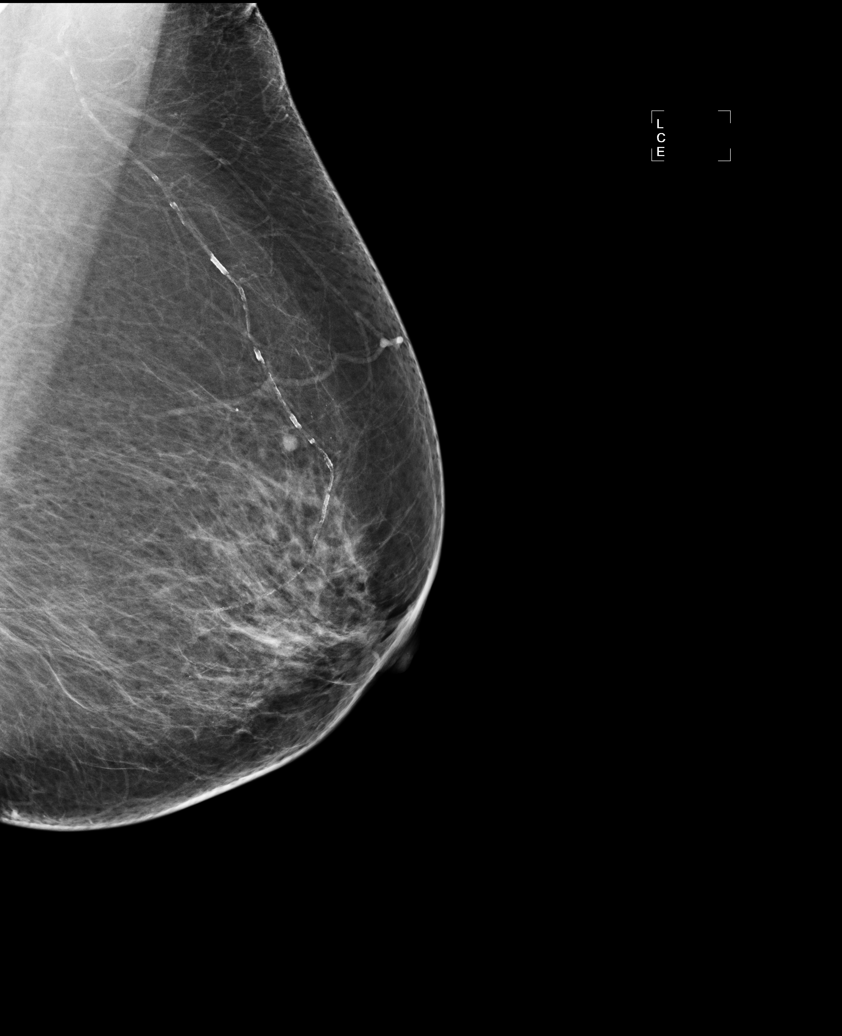

[R MLO (1 of 2)]
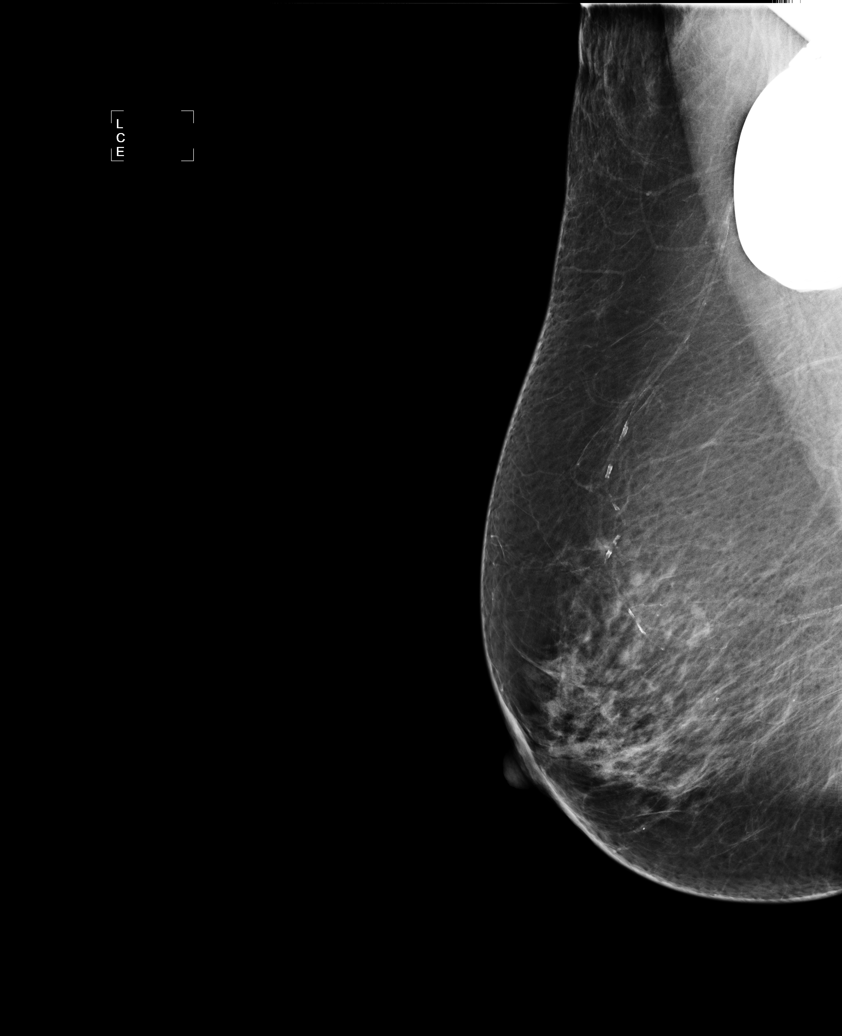

[L CC (2 of 2)]
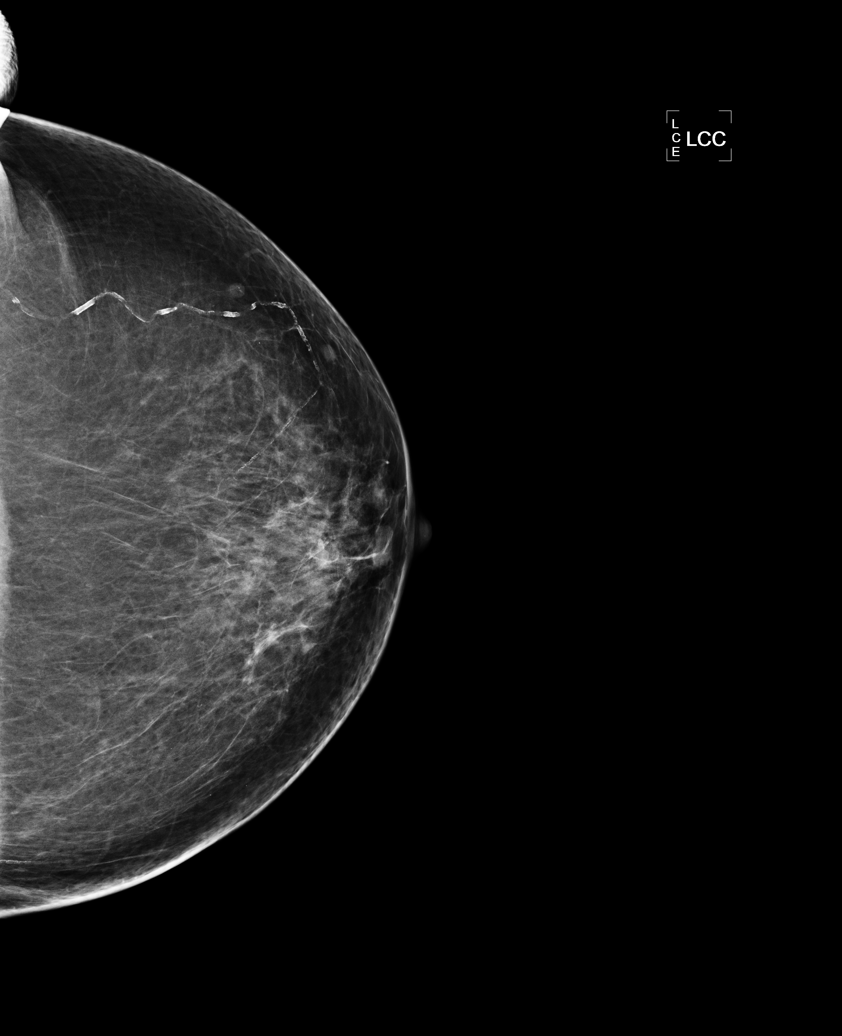

[R MLO (2 of 2)]
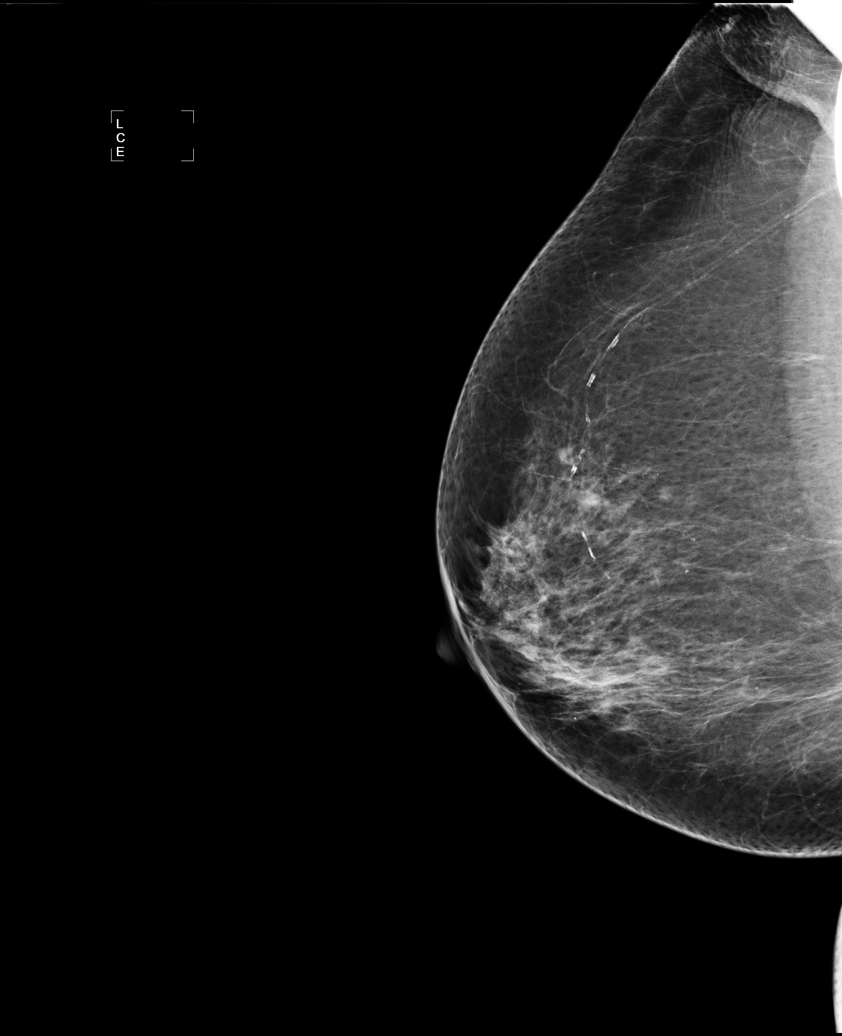

[6 of 6 positions shown; findings below may reference images not displayed]

FINDINGS: There are scattered fibroglandular densities. No
suspicious masses, architectural distortion, or calcifications are
present.

Images were processed with CAD.
IMPRESSION: No mammographic evidence of malignancy.

A result letter of this screening mammogram will be mailed directly
to the patient.

RECOMMENDATION:
Screening mammogram in one year. (Code:[id number])

BI-RADS CATEGORY 1:  Negative.

## 2013-05-28 DIAGNOSIS — H2510 Age-related nuclear cataract, unspecified eye: Secondary | ICD-10-CM | POA: Diagnosis not present

## 2013-07-02 ENCOUNTER — Ambulatory Visit: Payer: Medicare Other | Admitting: Cardiovascular Disease

## 2013-07-20 ENCOUNTER — Encounter: Payer: Self-pay | Admitting: *Deleted

## 2013-07-24 ENCOUNTER — Encounter: Payer: Self-pay | Admitting: Cardiovascular Disease

## 2013-07-24 ENCOUNTER — Ambulatory Visit (INDEPENDENT_AMBULATORY_CARE_PROVIDER_SITE_OTHER): Payer: Medicare Other | Admitting: Cardiovascular Disease

## 2013-07-24 VITALS — BP 130/74 | HR 70 | Ht 59.5 in | Wt 124.8 lb

## 2013-07-24 DIAGNOSIS — Z950 Presence of cardiac pacemaker: Secondary | ICD-10-CM | POA: Diagnosis not present

## 2013-07-24 DIAGNOSIS — Z79899 Other long term (current) drug therapy: Secondary | ICD-10-CM | POA: Diagnosis not present

## 2013-07-24 DIAGNOSIS — I2510 Atherosclerotic heart disease of native coronary artery without angina pectoris: Secondary | ICD-10-CM | POA: Diagnosis not present

## 2013-07-24 DIAGNOSIS — E782 Mixed hyperlipidemia: Secondary | ICD-10-CM

## 2013-07-24 DIAGNOSIS — I10 Essential (primary) hypertension: Secondary | ICD-10-CM

## 2013-07-24 DIAGNOSIS — R001 Bradycardia, unspecified: Secondary | ICD-10-CM

## 2013-07-24 DIAGNOSIS — I498 Other specified cardiac arrhythmias: Secondary | ICD-10-CM | POA: Diagnosis not present

## 2013-07-24 MED ORDER — SIMVASTATIN 20 MG PO TABS: 20.0000 mg | ORAL_TABLET | Freq: Every day | ORAL | Status: DC

## 2013-07-24 NOTE — Patient Instructions (Addendum)
STOP the Plavix.  START Simvastatin 20mg  Daily in the evening.  Your physician recommends that you return for lab work in: 3 months FASTING.  Make an appointment for 3 months to have your pacemaker checked.  Dr. Sallyanne Kuster recommends that you schedule a follow-up appointment in: 6 months.

## 2013-07-29 DIAGNOSIS — E785 Hyperlipidemia, unspecified: Secondary | ICD-10-CM | POA: Insufficient documentation

## 2013-07-29 NOTE — Progress Notes (Signed)
Patient ID: BRANDIN [name], female   DOB: December 27, 1927, 80 y.o.   MRN: [id number]      Reason for office visit Establish new cardiology followup  [name] is a former patient of Dr. Terance Ice who recently retired. She had seen him for many years for coronary disease (inferior wall MI with drug-eluting stent to the right coronary artery in 2008, repeat stenting of the distal RCA later the same year) and followup of her pacemaker (dual chamber St. Jude Zephyr, programmed AAI), implanted in 2008 for sinus node dysfunction and paroxysmal atrial fibrillation.  She has not had any coronary problems since that initial presentation and her most recent nuclear stress test performed one year ago showed no evidence of scar or ischemia. Her EF is normal and she has not had problems with congestive heart failure. He has treated hypertension and takes a statin for hyperlipidemia.  Her pacemaker has had problems with diaphragmatic stimulation from the right ventricular lead, and since she has not required any right ventricular pacing the device is programmed AAI. She has not had signs or symptoms of advanced heart block and lead revision and has therefore not been necessary. Plan was for her to receive a new right ventricular lead when her current generator reaches ERI,  but this is not anticipated for another 2-3 years.  Sustained Atrial fibrillation has apparently not been recorded in a very long time. She is not taking anticoagulants but does take aspirin. No atrial fibrillation is detected at today's device check  She also has a known mild aortic valve stenosis and mild aortic insufficiency.  Has occasional episodes of chest discomfort at night. This is different from her infarction pain and is never exertional. It is brief and likely not related to angina pectoris.  Allergies  Allergen Reactions  . Fosamax [Alendronate Sodium]     Anxious and "I just didn't feel right" after each dose; 2.5 month  trial    Current Outpatient Prescriptions  Medication Sig Dispense Refill  . ALPRAZolam (XANAX) 0.25 MG tablet Take 0.25 mg by mouth at bedtime as needed.        Marland Kitchen aspirin 81 MG tablet Take 81 mg by mouth every other day.       . calcium-vitamin D (OSCAL WITH D) 500-200 MG-UNIT per tablet Take 1 tablet by mouth daily.      . cetirizine (ZYRTEC) 10 MG tablet Take 10 mg by mouth daily as needed.       . Cholecalciferol 1000 UNITS capsule Take 2,000 Units by mouth daily.       Marland Kitchen losartan-hydrochlorothiazide (HYZAAR) 50-12.5 MG per tablet Take 1 tablet by mouth daily.      . metoprolol tartrate (LOPRESSOR) 25 MG tablet Take 1 tablet (25 mg total) by mouth 2 (two) times daily.  60 tablet  11  . Multiple Vitamin (MULTIVITAMIN) tablet Take 1 tablet by mouth daily.      . nitroGLYCERIN (NITROSTAT) 0.4 MG SL tablet Place 1 tablet (0.4 mg total) under the tongue every 5 (five) minutes as needed.  25 tablet  12  . omeprazole (PRILOSEC) 40 MG capsule Take 40 mg by mouth daily as needed.      . polyethylene glycol (MIRALAX / GLYCOLAX) packet Take 17 g by mouth daily as needed.       . potassium chloride (KLOR-CON) 10 MEQ CR tablet Take 10 mEq by mouth daily.        . Psyllium (METAMUCIL SMOOTH TEXTURE PO) as needed.  Use daily      . traMADol (ULTRAM) 50 MG tablet TAKE 1 TABLET BY MOUTH 3 TIMES A DAY AS NEEDED  90 tablet  1  . simvastatin (ZOCOR) 20 MG tablet Take 1 tablet (20 mg total) by mouth at bedtime.  90 tablet  3   No current facility-administered medications for this visit.    Past Medical History  Diagnosis Date  . Arthritis   . Allergy   . Hyperlipidemia   . Hypertension   . CAD (coronary artery disease)   . Myocardial infarction   . Heart murmur   . Insomnia   . Fatty liver     on u/s 2010  . Osteoporosis     dxa 2013  . SSS (sick sinus syndrome)     St. Jude Permanent Pacemaker 09/23/06  . PAF (paroxysmal atrial fibrillation)   . Symptomatic bradycardia     Past Surgical  History  Procedure Laterality Date  . Pacemaker insertion  09/23/06    St.Jude  . Cholecystectomy    . Back surgery    . Carpal tunnel release      left  . Foot surgery      Left  . Hernia repair      R inguinal  . Coronary angioplasty with stent placement  12/03/06    Stent to distal RCA  . Coronary angioplasty with stent placement  09/12/06    Stenting mid distal segment & PTCA alone distal RCA prox. to the posterior descending artery takeoff.  . US echocardiography  11/26/11    prox. septal thickening, mild concentric LVH, mod. ca+ AOV,trace AI,mild to mod valvular aortic stenosis.    Family History  Problem Relation Age of Onset  . Early death Mother     died at age 13, unknown cause  . Heart disease Father   . Diabetes Father     History   Social History  . Marital Status: Widowed    Spouse Name: N/A    Number of Children: N/A  . Years of Education: N/A   Occupational History  . Not on file.   Social History Main Topics  . Smoking status: Never Smoker   . Smokeless tobacco: Never Used  . Alcohol Use: No  . Drug Use: No  . Sexual Activity: Not on file   Other Topics Concern  . Not on file   Social History Narrative   Widowed in 1992, lives alone   2 daughters, both local   Retired from [employer] work   Enjoys church, Haematologist    Review of systems: The patient specifically denies any chest pain at rest or with exertion, dyspnea at rest or with exertion, orthopnea, paroxysmal nocturnal dyspnea, syncope, palpitations, focal neurological deficits, intermittent claudication, lower extremity edema, unexplained weight gain, cough, hemoptysis or wheezing.  The patient also denies abdominal pain, nausea, vomiting, dysphagia, diarrhea, constipation, polyuria, polydipsia, dysuria, hematuria, frequency, urgency, abnormal bleeding or bruising, fever, chills, unexpected weight changes, mood swings, change in skin or hair texture, change in voice quality, auditory or visual  problems, allergic reactions or rashes, new musculoskeletal complaints other than usual "aches and pains".   PHYSICAL EXAM BP 130/74  Pulse 70  Ht 4' 11.5" (1.511 m)  Wt 56.609 kg (124 lb 12.8 oz)  BMI 24.79 kg/m2  General: Alert, oriented x3, no distress Head: no evidence of trauma, PERRL, EOMI, no exophtalmos or lid lag, no myxedema, no xanthelasma; normal ears, nose and oropharynx Neck: normal jugular venous pulsations  and no hepatojugular reflux; brisk carotid pulses without delay and no carotid bruits Chest: clear to auscultation, no signs of consolidation by percussion or palpation, normal fremitus, symmetrical and full respiratory excursions, of the subclavian pacemaker site Cardiovascular: normal position and quality of the apical impulse, regular rhythm, normal first and second heart sounds, no rubs or gallops, grade 2/6 early peaking systolic ejection murmur. I do not hear a diastolic murmur Abdomen: no tenderness or distention, no masses by palpation, no abnormal pulsatility or arterial bruits, normal bowel sounds, no hepatosplenomegaly Extremities: no clubbing, cyanosis or edema; 2+ radial, ulnar and brachial pulses bilaterally; 2+ right femoral, posterior tibial and dorsalis pedis pulses; 2+ left femoral, posterior tibial and dorsalis pedis pulses; no subclavian or femoral bruits Neurological: grossly nonfocal   EKG: Atrial paced ventricular sensed, otherwise normal  Lipid Panel     Component Value Date/Time   CHOL 212* 03/11/2013 1337   TRIG 371* 03/11/2013 1337   HDL 44 03/11/2013 1337   CHOLHDL 4.8 03/11/2013 1337   VLDL 74* 03/11/2013 1337   LDLCALC 94 03/11/2013 1337    BMET    Component Value Date/Time   NA 136 03/11/2013 1337   K 4.1 03/11/2013 1337   CL 101 03/11/2013 1337   CO2 25 03/11/2013 1337   GLUCOSE 87 03/11/2013 1337   BUN 22 03/11/2013 1337   CREATININE 1.19* 03/11/2013 1337   CREATININE 1.1 07/04/2011 0907   CALCIUM 9.7 03/11/2013 1337   GFRNONAA >60  03/18/2009 1112   GFRAA  Value: >60        The eGFR has been calculated using the MDRD equation. This calculation has not been validated in all clinical situations. eGFR's persistently <60 mL/min signify possible Chronic Kidney Disease. 03/18/2009 1112     ASSESSMENT AND PLAN CAD (coronary artery disease) It has been almost 7 years she has received drug-eluting stents to the mid and distal right coronary artery without recurrent coronary problems. At the time of her MI in 2008 she did not have any disease in the LAD artery and had a 95% stenosis in a secondary branch of a small left circumflex coronary artery. She would like to shorten her medication list and at this point in time I see little benefit to continuation of clopidogrel so long after her stents were placed, even if they were drug-eluting.  Essential hypertension, benign Good control  Mixed hyperlipidemia Her total cholesterol is a little high, her LDL cholesterol conforms to older guidelines but is probably too high as well and her triglycerides are markedly elevated. She was on a temporary statin holiday because of mildly elevated liver function tests but these have not changed. They are attributed to fatty liver and are less than twice the upper limit of normal. I recommended that she restart simvastatin 20 mg daily and will recheck a lipid profile in about 3 months. Would like to avoid using combination statin fenofibrate therapy because of the liver abnormalities.  Pacemaker - dual chamber St. Jude Zephyr 2008 Normal device function in the AAI mode. No episodes of atrial fibrillation recorded. Roughly 2-3 years left on the current generator. Plan to drop a new right ventricular lead and it is time to change her current generator.  Chest discomfort mau\y have been GERD - try omeprazole Patient Instructions  STOP the Plavix.  START Simvastatin $RemoveBeforeDEI'20mg'[id number]$  Daily in the evening.  Your physician recommends that you return for lab work in:  3 months FASTING.  Make an appointment for 3 months to  have your pacemaker checked.  Dr. Sallyanne Kuster recommends that you schedule a follow-up appointment in: 6 months.       Orders Placed This Encounter  Procedures  . Lipid Profile  . Comp Met (CMET)  . EKG 12-Lead   Meds ordered this encounter  Medications  . omeprazole (PRILOSEC) 40 MG capsule    Sig: Take 40 mg by mouth daily as needed.  Marland Kitchen losartan-hydrochlorothiazide (HYZAAR) 50-12.5 MG per tablet    Sig: Take 1 tablet by mouth daily.  . simvastatin (ZOCOR) 20 MG tablet    Sig: Take 1 tablet (20 mg total) by mouth at bedtime.    Dispense:  90 tablet    Refill:  [place] Annalynne Ibanez, MD, [facility] [phone] office [phone] pager

## 2013-07-29 NOTE — Assessment & Plan Note (Signed)
Normal device function in the AAI mode. No episodes of atrial fibrillation recorded. Roughly 2-3 years left on the current generator. Plan to drop a new right ventricular lead and it is time to change her current generator.

## 2013-07-29 NOTE — Assessment & Plan Note (Addendum)
It has been almost 7 years she has received drug-eluting stents to the mid and distal right coronary artery without recurrent coronary problems. At the time of her MI in 2008 she did not have any disease in the LAD artery and had a 95% stenosis in a secondary branch of a small left circumflex coronary artery. She would like to shorten her medication list and at this point in time I see little benefit to continuation of clopidogrel so long after her stents were placed, even if they were drug-eluting.

## 2013-07-29 NOTE — Assessment & Plan Note (Addendum)
Her total cholesterol is a little high, her LDL cholesterol conforms to older guidelines but is probably too high as well and her triglycerides are markedly elevated. She was on a temporary statin holiday because of mildly elevated liver function tests but these have not changed. They are attributed to fatty liver and are less than twice the upper limit of normal. I recommended that she restart simvastatin 20 mg daily and will recheck a lipid profile in about 3 months. Would like to avoid using combination statin fenofibrate therapy because of the liver abnormalities.

## 2013-07-29 NOTE — Assessment & Plan Note (Signed)
Good control

## 2013-08-25 ENCOUNTER — Other Ambulatory Visit: Payer: Self-pay | Admitting: Family Medicine

## 2013-08-25 NOTE — Telephone Encounter (Signed)
Electronic refill request.  Please advise. 

## 2013-08-26 ENCOUNTER — Telehealth: Payer: Self-pay | Admitting: Cardiovascular Disease

## 2013-08-26 ENCOUNTER — Telehealth: Payer: Self-pay

## 2013-08-26 MED ORDER — LOSARTAN POTASSIUM-HCTZ 50-12.5 MG PO TABS: 1.0000 | ORAL_TABLET | Freq: Every day | ORAL | Status: DC

## 2013-08-26 MED ORDER — OMEPRAZOLE 40 MG PO CPDR: 40.0000 mg | DELAYED_RELEASE_CAPSULE | Freq: Every day | ORAL | Status: DC | PRN

## 2013-08-26 MED ORDER — POTASSIUM CHLORIDE ER 10 MEQ PO TBCR: 10.0000 meq | EXTENDED_RELEASE_TABLET | Freq: Every day | ORAL | Status: DC

## 2013-08-26 MED ORDER — ALPRAZOLAM 0.25 MG PO TABS: 0.2500 mg | ORAL_TABLET | Freq: Every evening | ORAL | Status: DC | PRN

## 2013-08-26 NOTE — Telephone Encounter (Signed)
Please call in

## 2013-08-26 NOTE — Telephone Encounter (Signed)
Need refill on Potassium Chloride 67meq#30,Losartan HCTZ  50/12.5 mg #30,Alprazolam 0.25mg  #60 and Omeprazole 40 mg #30.

## 2013-08-26 NOTE — Telephone Encounter (Signed)
Refills for  K+, Losartan HCTZ 50/12.5mg,Omeprazole 40 sent electronically.  Alprazolam printed for Dr. C's approval and signature tomorrow and will be faxed.  Patient notified she needs to get Alprazolam in the future from her PCP. 

## 2013-08-26 NOTE — Telephone Encounter (Signed)
Vivien Rota pts daughter called to ck on status of tramadol refill; Medication phoned to [facility] as instructed. Vivien Rota notified done.

## 2013-08-26 NOTE — Telephone Encounter (Signed)
[name] was calling to get refills on omeprazole and alprazolam; advised [name] it appears meds have already been refilled by cardiologist. [name] will ck with pharmacy and if any further needs [name] will cb/

## 2013-08-26 NOTE — Telephone Encounter (Signed)
Refills for  K+, Losartan HCTZ 50/12.5mg ,Omeprazole 40 sent electronically.  Alprazolam printed for Dr. Lurline Del approval and signature tomorrow and will be faxed.  Patient notified she needs to get Alprazolam in the future from her PCP.

## 2013-08-27 ENCOUNTER — Other Ambulatory Visit: Payer: Self-pay | Admitting: *Deleted

## 2013-08-27 NOTE — Telephone Encounter (Signed)
Alprazolam 0.25mg  qhs prn #30 no refills faxed to [facility].

## 2013-09-15 DIAGNOSIS — H2510 Age-related nuclear cataract, unspecified eye: Secondary | ICD-10-CM | POA: Diagnosis not present

## 2013-10-06 ENCOUNTER — Other Ambulatory Visit: Payer: Self-pay | Admitting: Cardiovascular Disease

## 2013-10-27 ENCOUNTER — Other Ambulatory Visit: Payer: Self-pay | Admitting: Cardiovascular Disease

## 2013-10-28 NOTE — Telephone Encounter (Signed)
Rx was sent to pharmacy electronically. 

## 2013-11-02 ENCOUNTER — Other Ambulatory Visit: Payer: Self-pay | Admitting: Cardiovascular Disease

## 2013-11-05 ENCOUNTER — Other Ambulatory Visit: Payer: Self-pay | Admitting: *Deleted

## 2013-11-05 NOTE — Telephone Encounter (Signed)
Fax refill request, next OV 12/18/13 (CPE), please advise

## 2013-11-06 MED ORDER — ALPRAZOLAM 0.25 MG PO TABS: 0.2500 mg | ORAL_TABLET | Freq: Every evening | ORAL | Status: DC | PRN

## 2013-11-06 NOTE — Telephone Encounter (Signed)
Please call in.  Thanks.   

## 2013-11-06 NOTE — Telephone Encounter (Signed)
Medication phoned to pharmacy.  

## 2013-12-06 ENCOUNTER — Other Ambulatory Visit: Payer: Self-pay | Admitting: Family Medicine

## 2013-12-06 DIAGNOSIS — M810 Age-related osteoporosis without current pathological fracture: Secondary | ICD-10-CM

## 2013-12-06 DIAGNOSIS — I2510 Atherosclerotic heart disease of native coronary artery without angina pectoris: Secondary | ICD-10-CM

## 2013-12-11 ENCOUNTER — Encounter: Payer: Self-pay | Admitting: *Deleted

## 2013-12-11 ENCOUNTER — Other Ambulatory Visit (INDEPENDENT_AMBULATORY_CARE_PROVIDER_SITE_OTHER): Payer: Medicare Other

## 2013-12-11 DIAGNOSIS — I2510 Atherosclerotic heart disease of native coronary artery without angina pectoris: Secondary | ICD-10-CM | POA: Diagnosis not present

## 2013-12-11 DIAGNOSIS — M810 Age-related osteoporosis without current pathological fracture: Secondary | ICD-10-CM | POA: Diagnosis not present

## 2013-12-11 DIAGNOSIS — Z79899 Other long term (current) drug therapy: Secondary | ICD-10-CM | POA: Diagnosis not present

## 2013-12-11 LAB — LIPID PANEL
CHOLESTEROL: 154 mg/dL (ref 0–200)
HDL: 51.8 mg/dL (ref >39.00)
LDL Cholesterol: 71 mg/dL (ref 0–99)
NONHDL: 102.2
TRIGLYCERIDES: 157 mg/dL — AB (ref 0.0–149.0)
Total CHOL/HDL Ratio: 3
VLDL: 31.4 mg/dL (ref 0.0–40.0)

## 2013-12-11 LAB — COMPREHENSIVE METABOLIC PANEL
ALT: 28 U/L (ref 0–35)
AST: 31 U/L (ref 0–37)
Albumin: 4.8 g/dL (ref 3.5–5.2)
Alkaline Phosphatase: 46 U/L (ref 39–117)
BILIRUBIN TOTAL: 1.1 mg/dL (ref 0.2–1.2)
BUN: 26 mg/dL — AB (ref 6–23)
CALCIUM: 10.4 mg/dL (ref 8.4–10.5)
CHLORIDE: 102 meq/L (ref 96–112)
CO2: 26 meq/L (ref 19–32)
CREATININE: 1.1 mg/dL (ref 0.4–1.2)
GFR: 50.78 mL/min — AB (ref >60.00)
GLUCOSE: 96 mg/dL (ref 70–99)
Potassium: 4.4 mEq/L (ref 3.5–5.1)
Sodium: 138 mEq/L (ref 135–145)
Total Protein: 7.9 g/dL (ref 6.0–8.3)

## 2013-12-11 LAB — VITAMIN D 25 HYDROXY (VIT D DEFICIENCY, FRACTURES): VITD: 43.42 ng/mL

## 2013-12-18 ENCOUNTER — Encounter: Payer: Medicare Other | Admitting: Family Medicine

## 2013-12-21 ENCOUNTER — Ambulatory Visit (INDEPENDENT_AMBULATORY_CARE_PROVIDER_SITE_OTHER): Payer: Medicare Other | Admitting: Family Medicine

## 2013-12-21 ENCOUNTER — Encounter: Payer: Self-pay | Admitting: Family Medicine

## 2013-12-21 VITALS — BP 112/64 | HR 54 | Temp 97.9°F | Ht 59.25 in | Wt 119.0 lb

## 2013-12-21 DIAGNOSIS — I2510 Atherosclerotic heart disease of native coronary artery without angina pectoris: Secondary | ICD-10-CM

## 2013-12-21 DIAGNOSIS — E7800 Pure hypercholesterolemia, unspecified: Secondary | ICD-10-CM | POA: Diagnosis not present

## 2013-12-21 DIAGNOSIS — I2584 Coronary atherosclerosis due to calcified coronary lesion: Secondary | ICD-10-CM

## 2013-12-21 DIAGNOSIS — I10 Essential (primary) hypertension: Secondary | ICD-10-CM | POA: Diagnosis not present

## 2013-12-21 DIAGNOSIS — Z0000 Encounter for general adult medical examination without abnormal findings: Secondary | ICD-10-CM

## 2013-12-21 DIAGNOSIS — G4700 Insomnia, unspecified: Secondary | ICD-10-CM

## 2013-12-21 DIAGNOSIS — Z7189 Other specified counseling: Secondary | ICD-10-CM

## 2013-12-21 MED ORDER — LOSARTAN POTASSIUM-HCTZ 50-12.5 MG PO TABS: 1.0000 | ORAL_TABLET | Freq: Every day | ORAL | Status: DC

## 2013-12-21 MED ORDER — OMEPRAZOLE 40 MG PO CPDR: 40.0000 mg | DELAYED_RELEASE_CAPSULE | Freq: Every day | ORAL | Status: DC | PRN

## 2013-12-21 MED ORDER — POTASSIUM CHLORIDE ER 10 MEQ PO TBCR: 10.0000 meq | EXTENDED_RELEASE_TABLET | Freq: Every day | ORAL | Status: DC

## 2013-12-21 MED ORDER — SIMVASTATIN 20 MG PO TABS: 20.0000 mg | ORAL_TABLET | Freq: Every day | ORAL | Status: DC

## 2013-12-21 MED ORDER — METOPROLOL TARTRATE 25 MG PO TABS: 25.0000 mg | ORAL_TABLET | Freq: Two times a day (BID) | ORAL | Status: DC

## 2013-12-21 MED ORDER — NITROGLYCERIN 0.4 MG SL SUBL: 0.4000 mg | SUBLINGUAL_TABLET | SUBLINGUAL | Status: DC | PRN

## 2013-12-21 MED ORDER — ALPRAZOLAM 0.25 MG PO TABS: 0.2500 mg | ORAL_TABLET | Freq: Every evening | ORAL | Status: DC | PRN

## 2013-12-21 MED ORDER — TRAMADOL HCL 50 MG PO TABS: ORAL_TABLET | ORAL | Status: DC

## 2013-12-21 NOTE — Patient Instructions (Addendum)
Check with your insurance to see if they will cover the shingles shot. I would get a flu shot each fall.    Take care.  Glad to see you.  Recheck in 1 year, sooner if needed.  Call back if you have questions or concerns.

## 2013-12-21 NOTE — Progress Notes (Signed)
Pre visit review using our clinic review tool, if applicable. No additional management support is needed unless otherwise documented below in the visit note.  I have personally reviewed the Medicare Annual Wellness questionnaire and have noted 1. The patient's medical and social history 2. Their use of alcohol, tobacco or illicit drugs 3. Their current medications and supplements 4. The patient's functional ability including ADL's, fall risks, home safety risks and hearing or visual             impairment. 5. Diet and physical activities 6. Evidence for depression or mood disorders  The patients weight, height, BMI have been recorded in the chart and visual acuity is per eye clinic.  I have made referrals, counseling and provided education to the patient based review of the above and I have provided the pt with a written personalized care plan for preventive services.  See scanned forms.  Routine anticipatory guidance given to patient.  See health maintenance. Flu prev done.  Shingles d/w pt  PNA prev done. Tetanus 2012 Colonoscopy not due given age.  She agrees.  Breast cancer screening up to date. DXA declined by patient.  Advance directive - daughter Audree Bane if patient were incapacitated.  Cognitive function addressed- see scanned forms- and if abnormal then additional documentation follows.   Hypertension:    Using medication without problems or lightheadedness: yes Chest pain with exertion:no [place] Short of breath:no  Elevated Cholesterol: Using medications without problems:yes Muscle aches: no Diet compliance:yes Exercise:limited LFTs wnl.   CAD.  See above.  Off plavix.  Occ nonexertional CP, likely from GERD, minimal/rare sx.  Labs d/w pt.   PMH and SH reviewed  Meds, vitals, and allergies reviewed.   ROS: See HPI.  Otherwise negative.    GEN: nad, alert and oriented HEENT: mucous membranes moist NECK: supple w/o LA CV: rrr. PULM: ctab, no inc  wob ABD: soft, +bs EXT: no edema SKIN: no acute rash

## 2013-12-22 DIAGNOSIS — Z0000 Encounter for general adult medical examination without abnormal findings: Secondary | ICD-10-CM | POA: Insufficient documentation

## 2013-12-22 DIAGNOSIS — Z7189 Other specified counseling: Secondary | ICD-10-CM | POA: Insufficient documentation

## 2013-12-22 NOTE — Assessment & Plan Note (Signed)
Controlled, continue as is.  LFTs wnl.  D/w pt.

## 2013-12-22 NOTE — Assessment & Plan Note (Signed)
See scanned forms.  Routine anticipatory guidance given to patient.  See health maintenance. Flu prev done.  Shingles d/w pt  PNA prev done. Tetanus 2012 Colonoscopy not due given age.  She agrees.  Breast cancer screening up to date. DXA declined by patient.  Advance directive - daughter Audree Bane if patient were incapacitated.  Cognitive function addressed- see scanned forms- and if abnormal then additional documentation follows.

## 2013-12-22 NOTE — Assessment & Plan Note (Signed)
Rare use of xanax, not daily, for insomnia and occ anxiety. Okay to continue.

## 2013-12-22 NOTE — Assessment & Plan Note (Signed)
Controlled, no change in meds today.   Labs d/w pt.

## 2013-12-22 NOTE — Assessment & Plan Note (Signed)
No exertional CP, continue as is. Off plavix.

## 2013-12-28 ENCOUNTER — Encounter: Payer: Self-pay | Admitting: Family Medicine

## 2014-01-26 ENCOUNTER — Ambulatory Visit (INDEPENDENT_AMBULATORY_CARE_PROVIDER_SITE_OTHER): Payer: Medicare Other | Admitting: Cardiovascular Disease

## 2014-01-26 ENCOUNTER — Encounter: Payer: Self-pay | Admitting: Cardiovascular Disease

## 2014-01-26 VITALS — BP 132/60 | HR 70 | Resp 16 | Ht 59.0 in | Wt 118.2 lb

## 2014-01-26 DIAGNOSIS — I6521 Occlusion and stenosis of right carotid artery: Secondary | ICD-10-CM

## 2014-01-26 DIAGNOSIS — I4891 Unspecified atrial fibrillation: Secondary | ICD-10-CM

## 2014-01-26 DIAGNOSIS — K589 Irritable bowel syndrome without diarrhea: Secondary | ICD-10-CM | POA: Diagnosis not present

## 2014-01-26 DIAGNOSIS — I10 Essential (primary) hypertension: Secondary | ICD-10-CM

## 2014-01-26 DIAGNOSIS — Z950 Presence of cardiac pacemaker: Secondary | ICD-10-CM

## 2014-01-26 DIAGNOSIS — E7800 Pure hypercholesterolemia, unspecified: Secondary | ICD-10-CM

## 2014-01-26 DIAGNOSIS — I2510 Atherosclerotic heart disease of native coronary artery without angina pectoris: Secondary | ICD-10-CM | POA: Diagnosis not present

## 2014-01-26 DIAGNOSIS — R7402 Elevation of levels of lactic acid dehydrogenase (LDH): Secondary | ICD-10-CM | POA: Diagnosis not present

## 2014-01-26 DIAGNOSIS — I6529 Occlusion and stenosis of unspecified carotid artery: Secondary | ICD-10-CM | POA: Diagnosis not present

## 2014-01-26 DIAGNOSIS — I480 Paroxysmal atrial fibrillation: Secondary | ICD-10-CM

## 2014-01-26 DIAGNOSIS — R0989 Other specified symptoms and signs involving the circulatory and respiratory systems: Secondary | ICD-10-CM

## 2014-01-26 DIAGNOSIS — E782 Mixed hyperlipidemia: Secondary | ICD-10-CM

## 2014-01-26 LAB — MDC_IDC_ENUM_SESS_TYPE_INCLINIC
Battery Impedance: 6600 Ohm
Date Time Interrogation Session: 20150804125638
Implantable Pulse Generator Model: 5826
Lead Channel Impedance Value: 334 Ohm
Lead Channel Pacing Threshold Pulse Width: 0.4 ms
Lead Channel Sensing Intrinsic Amplitude: 1.8 mV
Lead Channel Setting Pacing Amplitude: 2 V
MDC IDC MSMT BATTERY VOLTAGE: 2.67 V
MDC IDC MSMT LEADCHNL RA PACING THRESHOLD AMPLITUDE: 0.75 V
MDC IDC PG SERIAL: 1777349

## 2014-01-26 NOTE — Patient Instructions (Signed)
Your physician has requested that you have a carotid duplex. This test is an ultrasound of the carotid arteries in your neck. It looks at blood flow through these arteries that supply the brain with blood. Allow one hour for this exam. There are no restrictions or special instructions.  Dr. Sallyanne Kuster recommends that you schedule a follow-up appointment in: 6 Months with a pacemaker check.

## 2014-01-26 NOTE — Progress Notes (Signed)
Patient ID: [name], female   DOB: 08-09-1927, 80 y.o.   MRN: [id number]      Reason for office visit CAD, pacemaker followup, remote history of atrial fibrillation  Mrs. Linders has a remote history of coronary disease (inferior wall MI with drug-eluting stent to the right coronary artery in 2008, repeat stenting of the distal RCA later the same year) and symptomatic sinus node dysfunction status post pacemaker (dual chamber St. Jude Zephyr, programmed AAI, implanted in 2008) and remote paroxysmal atrial fibrillation.  She has not had any coronary problems since that initial presentation and her most recent nuclear stress test performed one year ago showed no evidence of scar or ischemia. Her EF is normal and she has not had problems with congestive heart failure. He has treated hypertension and takes a statin for hyperlipidemia.  Her pacemaker has had problems with diaphragmatic stimulation from the right ventricular lead, and since she has not required any right ventricular pacing the device is programmed AAI. She has not had signs or symptoms of advanced heart block and lead revision and has therefore not been necessary. Plan was for her to receive a new right ventricular lead when her current generator reaches ERI. Sustained atrial fibrillation has apparently not been recorded in a very long time. She is not taking anticoagulants but does take aspirin. No atrial fibrillation is detected at today's device check  She also has a known mild aortic valve stenosis and mild aortic insufficiency.  Has occasional episodes of chest discomfort at night. This is different from her infarction pain and is never exertional. It is brief and likely not related to angina pectoris.    Allergies  Allergen Reactions  . Fosamax [Alendronate Sodium]     Anxious and "I just didn't feel right" after each dose; 2.5 month trial    Current Outpatient Prescriptions  Medication Sig Dispense Refill  . ALPRAZolam  (XANAX) 0.25 MG tablet Take 1 tablet (0.25 mg total) by mouth at bedtime as needed.  30 tablet  5  . aspirin 81 MG tablet Take 81 mg by mouth every other day.       . calcium-vitamin D (OSCAL WITH D) 500-200 MG-UNIT per tablet Take 1 tablet by mouth daily.      . cetirizine (ZYRTEC) 10 MG tablet Take 10 mg by mouth daily as needed.       . chlorpheniramine (CHLOR-TRIMETON) 2 MG/5ML syrup Take 4 mg by mouth as needed for allergies.      . Cholecalciferol 1000 UNITS capsule Take 2,000 Units by mouth daily.       Marland Kitchen losartan-hydrochlorothiazide (HYZAAR) 50-12.5 MG per tablet Take 1 tablet by mouth daily.  90 tablet  3  . metoprolol tartrate (LOPRESSOR) 25 MG tablet Take 1 tablet (25 mg total) by mouth 2 (two) times daily.  180 tablet  3  . Multiple Vitamin (MULTIVITAMIN) tablet Take 1 tablet by mouth daily.      . nitroGLYCERIN (NITROSTAT) 0.4 MG SL tablet Place 1 tablet (0.4 mg total) under the tongue every 5 (five) minutes as needed.  25 tablet  12  . omeprazole (PRILOSEC) 40 MG capsule Take 1 capsule (40 mg total) by mouth daily as needed.  90 capsule  3  . polyethylene glycol (MIRALAX / GLYCOLAX) packet Take 17 g by mouth daily as needed.       . potassium chloride (K-DUR) 10 MEQ tablet Take 1 tablet (10 mEq total) by mouth daily.  90 tablet  3  .  simvastatin (ZOCOR) 20 MG tablet Take 1 tablet (20 mg total) by mouth at bedtime.  90 tablet  3  . traMADol (ULTRAM) 50 MG tablet TAKE ONE TABLET THREE TIMES DAILY  90 tablet  5   No current facility-administered medications for this visit.    Past Medical History  Diagnosis Date  . Arthritis   . Allergy   . Hyperlipidemia   . Hypertension   . CAD (coronary artery disease)   . Myocardial infarction   . Heart murmur   . Insomnia   . Fatty liver     on u/s 2010  . Osteoporosis     dxa 2013  . SSS (sick sinus syndrome)     St. Jude Permanent Pacemaker 09/23/06  . PAF (paroxysmal atrial fibrillation)   . Symptomatic bradycardia     Past  Surgical History  Procedure Laterality Date  . Pacemaker insertion  09/23/06    St.Jude  . Cholecystectomy    . Back surgery    . Carpal tunnel release      left  . Foot surgery      Left  . Hernia repair      R inguinal  . Coronary angioplasty with stent placement  12/03/06    Stent to distal RCA  . Coronary angioplasty with stent placement  09/12/06    Stenting mid distal segment & PTCA alone distal RCA prox. to the posterior descending artery takeoff.  . US echocardiography  11/26/11    prox. septal thickening, mild concentric LVH, mod. ca+ AOV,trace AI,mild to mod valvular aortic stenosis.    Family History  Problem Relation Age of Onset  . Early death Mother     died at age 34, unknown cause  . Heart disease Father   . Diabetes Father     History   Social History  . Marital Status: Widowed    Spouse Name: N/A    Number of Children: N/A  . Years of Education: N/A   Occupational History  . Not on file.   Social History Main Topics  . Smoking status: Never Smoker   . Smokeless tobacco: Never Used  . Alcohol Use: No  . Drug Use: No  . Sexual Activity: Not on file   Other Topics Concern  . Not on file   Social History Narrative   Widowed in 1992, lives alone   2 daughters, both local   Retired from [employer] work   Enjoys church, Haematologist    Review of systems: The patient specifically denies any chest pain at rest or with exertion, dyspnea at rest or with exertion, orthopnea, paroxysmal nocturnal dyspnea, syncope, palpitations, focal neurological deficits, intermittent claudication, lower extremity edema, unexplained weight gain, cough, hemoptysis or wheezing.  The patient also denies abdominal pain, nausea, vomiting, dysphagia, diarrhea, constipation, polyuria, polydipsia, dysuria, hematuria, frequency, urgency, abnormal bleeding or bruising, fever, chills, unexpected weight changes, mood swings, change in skin or hair texture, change in voice quality, auditory  or visual problems, allergic reactions or rashes, new musculoskeletal complaints other than usual "aches and pains".   PHYSICAL EXAM BP 132/60  Pulse 70  Resp 16  Ht $R'4\' 11"'Hc$  (1.499 m)  Wt 118 lb 3.2 oz (53.615 kg)  BMI 23.86 kg/m2 General: Alert, oriented x3, no distress  Head: no evidence of trauma, PERRL, EOMI, no exophtalmos or lid lag, no myxedema, no xanthelasma; normal ears, nose and oropharynx  Neck: normal jugular venous pulsations and no hepatojugular reflux; brisk carotid pulses without delay  and new loud right carotid bruit  Chest: clear to auscultation, no signs of consolidation by percussion or palpation, normal fremitus, symmetrical and full respiratory excursions, of the subclavian pacemaker site  Cardiovascular: normal position and quality of the apical impulse, regular rhythm, normal first and second heart sounds, no rubs or gallops, grade 2/6 early peaking systolic ejection murmur. I do not hear a diastolic murmur  Abdomen: no tenderness or distention, no masses by palpation, no abnormal pulsatility or arterial bruits, normal bowel sounds, no hepatosplenomegaly  Extremities: no clubbing, cyanosis or edema; 2+ radial, ulnar and brachial pulses bilaterally; 2+ right femoral, posterior tibial and dorsalis pedis pulses; 2+ left femoral, posterior tibial and dorsalis pedis pulses; no subclavian or femoral bruits  Neurological: grossly nonfocal   EKG: Atrial paced ventricular sensed, otherwise normal  Lipid Panel     Component Value Date/Time   CHOL 154 12/11/2013 1006   TRIG 157.0* 12/11/2013 1006   HDL 51.80 12/11/2013 1006   CHOLHDL 3 12/11/2013 1006   VLDL 31.4 12/11/2013 1006   LDLCALC 71 12/11/2013 1006    BMET    Component Value Date/Time   NA 138 12/11/2013 1006   K 4.4 12/11/2013 1006   CL 102 12/11/2013 1006   CO2 26 12/11/2013 1006   GLUCOSE 96 12/11/2013 1006   BUN 26* 12/11/2013 1006   CREATININE 1.1 12/11/2013 1006   CREATININE 1.19* 03/11/2013 1337   CALCIUM  10.4 12/11/2013 1006   GFRNONAA >60 03/18/2009 1112   GFRAA  Value: >60        The eGFR has been calculated using the MDRD equation. This calculation has not been validated in all clinical situations. eGFR's persistently <60 mL/min signify possible Chronic Kidney Disease. 03/18/2009 1112     ASSESSMENT AND PLAN CAD (coronary artery disease)  It has been almost 7 years she has received drug-eluting stents to the mid and distal right coronary artery without recurrent coronary problems. At the time of her MI in 2008 she did not have any disease in the LAD artery and had a 95% stenosis in a secondary branch of a small left circumflex coronary artery.   Essential hypertension, benign  Good control   Mixed hyperlipidemia  Excellent total cholesterol and LDL. Minimally elevated triglycerides. Continue same medical regimen.  Pacemaker - dual chamber St. Jude Zephyr 2008  Normal device function in the AAI mode. No episodes of atrial fibrillation recorded. Roughly 1 year left on the current generator. Plan to drop a new right ventricular lead when it is time to change her current generator. We discussed this in detail today  Right carotid bruit This is quite prominent today and I do not recall hearing it at her previous appointment. She had a carotid duplex ultrasound in February 2014 that showed only mild nonobstructive plaque. I believe it should be repeated   Kambrea Carrasco  Sanda Klein, MD, [place] [phone] office [phone] pager

## 2014-02-09 ENCOUNTER — Ambulatory Visit (HOSPITAL_COMMUNITY)
Admission: RE | Admit: 2014-02-09 | Discharge: 2014-02-09 | Disposition: A | Discharge status: Home / Self Care | Payer: Medicare Other | Source: Ambulatory Visit | Attending: Cardiology | Admitting: Cardiology

## 2014-02-09 DIAGNOSIS — I6521 Occlusion and stenosis of right carotid artery: Secondary | ICD-10-CM

## 2014-02-09 DIAGNOSIS — I6529 Occlusion and stenosis of unspecified carotid artery: Secondary | ICD-10-CM | POA: Diagnosis not present

## 2014-02-09 NOTE — Progress Notes (Signed)
Carotid Duplex Completed. °Brianna L Mazza,RVT °

## 2014-02-22 DIAGNOSIS — L821 Other seborrheic keratosis: Secondary | ICD-10-CM | POA: Diagnosis not present

## 2014-02-22 DIAGNOSIS — L820 Inflamed seborrheic keratosis: Secondary | ICD-10-CM | POA: Diagnosis not present

## 2014-02-22 DIAGNOSIS — B372 Candidiasis of skin and nail: Secondary | ICD-10-CM | POA: Diagnosis not present

## 2014-02-22 DIAGNOSIS — L538 Other specified erythematous conditions: Secondary | ICD-10-CM | POA: Diagnosis not present

## 2014-02-22 DIAGNOSIS — L570 Actinic keratosis: Secondary | ICD-10-CM | POA: Diagnosis not present

## 2014-02-24 DIAGNOSIS — L821 Other seborrheic keratosis: Secondary | ICD-10-CM | POA: Diagnosis not present

## 2014-02-24 DIAGNOSIS — R209 Unspecified disturbances of skin sensation: Secondary | ICD-10-CM | POA: Diagnosis not present

## 2014-03-08 ENCOUNTER — Encounter: Payer: Self-pay | Admitting: Cardiovascular Disease

## 2014-03-12 ENCOUNTER — Telehealth: Payer: Self-pay | Admitting: Cardiovascular Disease

## 2014-03-12 NOTE — Telephone Encounter (Signed)
Patient noticed a pain under ppm and ?swelling x 2 days. Denies radiation to any other parts of her body. Denies SOB. Offered patient an appt, but she said that she would have to talk to her daughter about scheduling an appt. I advised her to go to ER if sxms worsen over the weekend.

## 2014-03-12 NOTE — Telephone Encounter (Signed)
Please call,her battery is getting low> She is having pains in the area where the pacemaker is,hurts when she takes a breath.She thinks she might need to be seen,Please call asap.

## 2014-03-17 DIAGNOSIS — H2510 Age-related nuclear cataract, unspecified eye: Secondary | ICD-10-CM | POA: Diagnosis not present

## 2014-06-16 DIAGNOSIS — R0781 Pleurodynia: Secondary | ICD-10-CM | POA: Diagnosis not present

## 2014-07-07 ENCOUNTER — Other Ambulatory Visit: Payer: Self-pay | Admitting: Family Medicine

## 2014-07-07 NOTE — Telephone Encounter (Signed)
Pt left v/m requesting status of alprazolam refill.Please advise.

## 2014-07-07 NOTE — Telephone Encounter (Signed)
Received refill request electronically from pharmacy. Last refill 12/21/13 #30/5 refills, last office visit same day. Is it okay to refill medication?

## 2014-07-08 NOTE — Telephone Encounter (Signed)
Medication phoned to pharmacy.  

## 2014-07-08 NOTE — Telephone Encounter (Signed)
Please call in.  Schedule CPE summer 2016.  Thanks.

## 2014-07-12 ENCOUNTER — Ambulatory Visit: Payer: Medicare Other | Admitting: Family Medicine

## 2014-07-12 ENCOUNTER — Encounter: Payer: Self-pay | Admitting: Family Medicine

## 2014-07-12 ENCOUNTER — Ambulatory Visit (INDEPENDENT_AMBULATORY_CARE_PROVIDER_SITE_OTHER): Payer: Medicare Other | Admitting: Family Medicine

## 2014-07-12 VITALS — BP 146/70 | HR 62 | Temp 97.3°F | Wt 120.8 lb

## 2014-07-12 DIAGNOSIS — H109 Unspecified conjunctivitis: Secondary | ICD-10-CM

## 2014-07-12 DIAGNOSIS — R21 Rash and other nonspecific skin eruption: Secondary | ICD-10-CM | POA: Diagnosis not present

## 2014-07-12 DIAGNOSIS — Y92009 Unspecified place in unspecified non-institutional (private) residence as the place of occurrence of the external cause: Secondary | ICD-10-CM

## 2014-07-12 DIAGNOSIS — Y92099 Unspecified place in other non-institutional residence as the place of occurrence of the external cause: Secondary | ICD-10-CM | POA: Diagnosis not present

## 2014-07-12 DIAGNOSIS — W19XXXA Unspecified fall, initial encounter: Secondary | ICD-10-CM | POA: Diagnosis not present

## 2014-07-12 MED ORDER — TRIAMCINOLONE ACETONIDE 0.1 % EX CREA: 1.0000 "application " | TOPICAL_CREAM | Freq: Two times a day (BID) | CUTANEOUS | Status: DC | PRN

## 2014-07-12 MED ORDER — ERYTHROMYCIN 5 MG/GM OP OINT
1.0000 | TOPICAL_OINTMENT | Freq: Three times a day (TID) | OPHTHALMIC | Status: DC
Start: 2014-07-12 — End: 2014-10-19

## 2014-07-12 NOTE — Progress Notes (Signed)
Pre visit review using our clinic review tool, if applicable. No additional management support is needed unless otherwise documented below in the visit note.  L chest wall pain after falling a few days before xmas 2015, she tripped.  Broke her glasses with the fall.  Seen at outside clinic for chest wall pain.   Cxr done, possible rib fx, then was told via overread that there was no fx.  By the second week, it was some better.  Now with not much pain at all.  Clearly getting better daily per patient and family.  Not SOB.  No fevers.  Bruising resolved.    She has had some occ frontal pain, along the L brow line.  Worse recently, didn't start with the fall.  Some pain with blinking.  No other trauma other than the fall. No vision changes in R eye.  Some blurry vision in L eye today.  No eye discharge. L eye is red today, recently developed sx.    Itchy rash on L shin, some better with hydrocortisone, not resolved.    Meds, vitals, and allergies reviewed.   ROS: See HPI.  Otherwise, noncontributory.  nad Tm wnl Nasal exam and OP exam wnl Fundus wnl B, PERRL,EOMI B L eye with limbus sparing conjunctival injection with scant white discharge, no FB noted. Orbit not ttp Neck supple, no LA rrr ctab Chest wall minimally ttp, no bruising abd soft Faint local rash on L shin noted.  Doesn't appear infected.

## 2014-07-12 NOTE — Patient Instructions (Signed)
Use the erythromycin ointment in your left eye and use the triamcinolone on the rash.  Both should help.  If the rash isn't better then notify me.  If your eye sx continue, then please call the eye clinic.  Take care.  Glad to see you.

## 2014-07-14 DIAGNOSIS — Y92009 Unspecified place in unspecified non-institutional (private) residence as the place of occurrence of the external cause: Secondary | ICD-10-CM

## 2014-07-14 DIAGNOSIS — W19XXXA Unspecified fall, initial encounter: Secondary | ICD-10-CM | POA: Insufficient documentation

## 2014-07-14 DIAGNOSIS — H109 Unspecified conjunctivitis: Secondary | ICD-10-CM | POA: Insufficient documentation

## 2014-07-14 NOTE — Assessment & Plan Note (Signed)
Okay to try TAC prn for the itching. F/u prn.

## 2014-07-14 NOTE — Assessment & Plan Note (Signed)
D/w pt about fall cautions.  Her chest wall pain is essentially resolved. She has family support at home.

## 2014-07-14 NOTE — Assessment & Plan Note (Signed)
Incidental to other issues, recently developed.  No FB seen. Start topical tx and fu with eye clinic if not better.  She agrees.

## 2014-08-10 ENCOUNTER — Encounter: Payer: Medicare Other | Admitting: Cardiovascular Disease

## 2014-08-20 DIAGNOSIS — H109 Unspecified conjunctivitis: Secondary | ICD-10-CM | POA: Diagnosis not present

## 2014-09-21 ENCOUNTER — Telehealth: Payer: Self-pay

## 2014-09-21 NOTE — Telephone Encounter (Signed)
No answer, no way to leave message about flu vaccine

## 2014-10-06 ENCOUNTER — Other Ambulatory Visit: Payer: Self-pay | Admitting: Family Medicine

## 2014-10-06 NOTE — Telephone Encounter (Signed)
Received refill request electronically from pharmacy. Last refill 12/21/13 #90/5 refills, last office visit 07/12/14. Is it okay to refill medication?

## 2014-10-07 NOTE — Telephone Encounter (Signed)
Please call in.  Thanks.   

## 2014-10-07 NOTE — Telephone Encounter (Signed)
Medication phoned to pharmacy.  

## 2014-10-19 ENCOUNTER — Encounter: Payer: Self-pay | Admitting: Cardiovascular Disease

## 2014-10-19 ENCOUNTER — Ambulatory Visit (INDEPENDENT_AMBULATORY_CARE_PROVIDER_SITE_OTHER): Payer: Medicare Other | Admitting: Cardiovascular Disease

## 2014-10-19 VITALS — BP 138/82 | HR 62 | Ht 59.0 in | Wt 124.4 lb

## 2014-10-19 DIAGNOSIS — I10 Essential (primary) hypertension: Secondary | ICD-10-CM

## 2014-10-19 DIAGNOSIS — Z950 Presence of cardiac pacemaker: Secondary | ICD-10-CM

## 2014-10-19 DIAGNOSIS — I2510 Atherosclerotic heart disease of native coronary artery without angina pectoris: Secondary | ICD-10-CM | POA: Diagnosis not present

## 2014-10-19 DIAGNOSIS — I4891 Unspecified atrial fibrillation: Secondary | ICD-10-CM | POA: Diagnosis not present

## 2014-10-19 DIAGNOSIS — I480 Paroxysmal atrial fibrillation: Secondary | ICD-10-CM

## 2014-10-19 DIAGNOSIS — E079 Disorder of thyroid, unspecified: Secondary | ICD-10-CM

## 2014-10-19 DIAGNOSIS — Z45018 Encounter for adjustment and management of other part of cardiac pacemaker: Secondary | ICD-10-CM | POA: Diagnosis not present

## 2014-10-19 DIAGNOSIS — E782 Mixed hyperlipidemia: Secondary | ICD-10-CM

## 2014-10-19 LAB — APTT: aPTT: 28 seconds (ref 24–37)

## 2014-10-19 LAB — MDC_IDC_ENUM_SESS_TYPE_INCLINIC
Battery Voltage: 2.61 V
Brady Statistic RA Percent Paced: 83 %
Implantable Pulse Generator Model: 5826
Implantable Pulse Generator Serial Number: 1777349
Lead Channel Impedance Value: 342 Ohm
Lead Channel Pacing Threshold Amplitude: 0.75 V
MDC IDC MSMT LEADCHNL RA PACING THRESHOLD PULSEWIDTH: 0.4 ms
MDC IDC MSMT LEADCHNL RA SENSING INTR AMPL: 2.8 mV
MDC IDC SET LEADCHNL RA PACING AMPLITUDE: 2 V

## 2014-10-19 LAB — PROTIME-INR
INR: 1 (ref <1.50)
Prothrombin Time: 13.2 seconds (ref 11.6–15.2)

## 2014-10-19 NOTE — Patient Instructions (Signed)
Your physician has recommended that you have a pacemaker generator change out w/new RV lead. A pacemaker is a small device that is placed under the skin of your chest or abdomen to help control abnormal heart rhythms. This device uses electrical pulses to prompt the heart to beat at a normal rate. Pacemakers are used to treat heart rhythms that are too slow. Wire (leads) are attached to the pacemaker that goes into the chambers of you heart. This is done in the hospital and usually requires and overnight stay. Please see the instruction sheet given to you today for more information.  Your physician recommends that you return for lab work within 7 days of the procedure date.

## 2014-10-19 NOTE — Progress Notes (Signed)
Patient ID: [name], female   DOB: 07/20/1927, 81 y.o.   MRN: [id number]     Cardiology Office Note   Date:  10/19/2014   ID:  [name], DOB 07/07/1927, MRN [id number]  PCP:  Graham Duncan, MD  Cardiologist:   ,, MD   Chief Complaint  Patient presents with  . Follow-up    6 months:  No complaints of chest pain, SOB, edema and infreq. dizziness.      History of Present Illness: [name] is a 81 y.o. female who presents for CAD, pacemaker followup, remote history of atrial fibrillation  [name] has a remote history of coronary disease (inferior wall MI with drug-eluting stent to the right coronary artery in 2008, repeat stenting of the distal RCA later the same year),  sinus node dysfunction s/p pacemaker (dual chamber St. Jude Zephyr, programmed AAI, implanted in 2008) and remote paroxysmal atrial fibrillation.   Interrogation of her pacemaker shows that her device reached elective replacement indicator almost 4 months ago. She has chronic problems with her right ventricular lead which produces diaphragmatic stimulation. For this reason she has been programmed AAI rather than DDD.  She has not had any coronary problems since that initial presentation and her most recent nuclear stress test performed 2014 showed no evidence of scar or ischemia. Her EF is normal and she has not had problems with congestive heart failure. Has treated hypertension and takes a statin for hyperlipidemia.   Sustained atrial fibrillation has apparently not been recorded in a very long time. She is not taking anticoagulants but does take aspirin. No atrial fibrillation is detected at today's device check   She also has a known mild aortic valve stenosis and mild aortic insufficiency.     Past Medical History  Diagnosis Date  . Arthritis   . Allergy   . Hyperlipidemia   . Hypertension   . CAD (coronary artery disease)   . Myocardial infarction   . Heart murmur   .  Insomnia   . Fatty liver     on u/s 2010  . Osteoporosis     dxa 2013  . SSS (sick sinus syndrome)     St. Jude Permanent Pacemaker 09/23/06  . PAF (paroxysmal atrial fibrillation)   . Symptomatic bradycardia     Past Surgical History  Procedure Laterality Date  . Pacemaker insertion  09/23/06    St.Jude  . Cholecystectomy    . Back surgery    . Carpal tunnel release      left  . Foot surgery      Left  . Hernia repair      R inguinal  . Coronary angioplasty with stent placement  12/03/06    Stent to distal RCA  . Coronary angioplasty with stent placement  09/12/06    Stenting mid distal segment & PTCA alone distal RCA prox. to the posterior descending artery takeoff.  . Us echocardiography  11/26/11    prox. septal thickening, mild concentric LVH, mod. ca+ AOV,trace AI,mild to mod valvular aortic stenosis.     Current Outpatient Prescriptions  Medication Sig Dispense Refill  . ALPRAZolam (XANAX) 0.25 MG tablet TAKE ONE TABLET BY MOUTH AT BEDTIME AS NEEDED 30 tablet 5  . aspirin 81 MG tablet Take 81 mg by mouth every other day.     . calcium-vitamin D (OSCAL WITH D) 500-200 MG-UNIT per tablet Take 1 tablet by mouth daily as needed.     . Cholecalciferol   1000 UNITS capsule Take 2,000 Units by mouth daily as needed.     . losartan-hydrochlorothiazide (HYZAAR) 50-12.5 MG per tablet Take 1 tablet by mouth daily. 90 tablet 3  . metoprolol tartrate (LOPRESSOR) 25 MG tablet Take 1 tablet (25 mg total) by mouth 2 (two) times daily. 180 tablet 3  . Multiple Vitamin (MULTIVITAMIN) tablet Take 1 tablet by mouth daily as needed.     . nitroGLYCERIN (NITROSTAT) 0.4 MG SL tablet Place 1 tablet (0.4 mg total) under the tongue every 5 (five) minutes as needed. 25 tablet 12  . omeprazole (PRILOSEC) 40 MG capsule Take 1 capsule (40 mg total) by mouth daily as needed. 90 capsule 3  . polyethylene glycol (MIRALAX / GLYCOLAX) packet Take 17 g by mouth daily as needed.     . potassium chloride  (K-DUR) 10 MEQ tablet Take 1 tablet (10 mEq total) by mouth daily. 90 tablet 3  . simvastatin (ZOCOR) 20 MG tablet Take 1 tablet (20 mg total) by mouth at bedtime. 90 tablet 3  . traMADol (ULTRAM) 50 MG tablet TAKE ONE TABLET BY MOUTH 3 TIMES DAILY 90 tablet 5  . triamcinolone cream (KENALOG) 0.1 % Apply 1 application topically 2 (two) times daily as needed. For rash 30 g 0   No current facility-administered medications for this visit.    Allergies:   Fosamax    Social History:  The patient  reports that she has never smoked. She has never used smokeless tobacco. She reports that she does not drink alcohol or use illicit drugs.   Family History:  The patient's family history includes Diabetes in her father; Early death in her mother; Heart disease in her father.    ROS:  Please see the history of present illness.    Otherwise, review of systems positive for none.   All other systems are reviewed and negative.    PHYSICAL EXAM: VS:  BP 138/82 mmHg  Pulse 62  Ht 4' 11" (1.499 m)  Wt 124 lb 6.4 oz (56.427 kg)  BMI 25.11 kg/m2 , BMI Body mass index is 25.11 kg/(m^2).  General: Alert, oriented x3, no distress  Head: no evidence of trauma, PERRL, EOMI, no exophtalmos or lid lag, no myxedema, no xanthelasma; normal ears, nose and oropharynx  Neck: normal jugular venous pulsations and no hepatojugular reflux; brisk carotid pulses without delay and new loud right carotid bruit  Chest: clear to auscultation, no signs of consolidation by percussion or palpation, normal fremitus, symmetrical and full respiratory excursions, of the subclavian pacemaker site  Cardiovascular: normal position and quality of the apical impulse, regular rhythm, normal first and second heart sounds, no rubs or gallops, grade 2/6 early peaking systolic ejection murmur. I do not hear a diastolic murmur  Abdomen: no tenderness or distention, no masses by palpation, no abnormal pulsatility or arterial bruits, normal  bowel sounds, no hepatosplenomegaly  Extremities: no clubbing, cyanosis or edema; 2+ radial, ulnar and brachial pulses bilaterally; 2+ right femoral, posterior tibial and dorsalis pedis pulses; 2+ left femoral, posterior tibial and dorsalis pedis pulses; no subclavian or femoral bruits  Neurological: grossly nonfocal  Psych: euthymic mood, full affect   EKG:  EKG is ordered today. The ekg ordered today demonstrates atrial paced, ventricular sensed otherwise normal   Recent Labs: 12/11/2013: ALT 28; BUN 26*; Creatinine 1.1; Potassium 4.4; Sodium 138    Lipid Panel    Component Value Date/Time   CHOL 154 12/11/2013 1006   TRIG 157.0* 12/11/2013 1006     HDL 51.80 12/11/2013 1006   CHOLHDL 3 12/11/2013 1006   VLDL 31.4 12/11/2013 1006   LDLCALC 71 12/11/2013 1006      Wt Readings from Last 3 Encounters:  10/19/14 124 lb 6.4 oz (56.427 kg)  07/12/14 120 lb 12 oz (54.772 kg)  01/26/14 118 lb 3.2 oz (53.615 kg)      ASSESSMENT AND PLAN:  1.  Dual-chamber permanent pacemaker at elective replacement indicator - needs both a new pacemaker generator and a new right ventricular lead due to problems with right ventricular lead diaphragmatic stimulation. Discussed the potential risks and complications of the procedure including the risk of pneumothorax, lead dislodgment and need for reoperation, perforation, infection, etc. She understands and agrees to proceed.  2. Symptomatic bradycardia to sinus node dysfunction.  3. Reported history of paroxysmal atrial fibrillation, not detected in many years despite pacemaker monitoring. She is not taking anticoagulation but is on aspirin.  4. CAD - It has been over 7 years she has received drug-eluting stents to the mid and distal right coronary artery without recurrent coronary problems. At the time of her MI in 2008 she did not have any disease in the LAD artery and had a 95% stenosis in a secondary branch of a small left circumflex coronary  artery.   4. Hyperlipidemia - good total cholesterol and LDL cholesterol levels with borderline hypertriglyceridemia.  5. Right carotid bruit - very mild plaque by duplex ultrasonography no obstruction  6. Minimal aortic stenosis and trivial aortic insufficiency by previous echocardiogram   Current medicines are reviewed at length with the patient today.  The patient does not have concerns regarding medicines.  The following changes have been made:  no change  Labs/ tests ordered today include:  Orders Placed This Encounter  Procedures  . Comp Met (CMET)  . TSH  . CBC  . INR/PT  . PTT  . Implantable device check  . EKG 12-Lead  . PACEMAKER GENERATOR CHANGE   Patient Instructions  Your physician has recommended that you have a pacemaker generator change out w/new RV lead. A pacemaker is a small device that is placed under the skin of your chest or abdomen to help control abnormal heart rhythms. This device uses electrical pulses to prompt the heart to beat at a normal rate. Pacemakers are used to treat heart rhythms that are too slow. Wire (leads) are attached to the pacemaker that goes into the chambers of you heart. This is done in the hospital and usually requires and overnight stay. Please see the instruction sheet given to you today for more information.  Your physician recommends that you return for lab work within 7 days of the procedure date.       Signed, ,, MD  10/19/2014 2:56 PM     , MD, FACC [facility] [phone] office [phone] pager   

## 2014-10-20 LAB — COMPREHENSIVE METABOLIC PANEL
ALK PHOS: 47 U/L (ref 39–117)
ALT: 39 U/L — AB (ref 0–35)
AST: 44 U/L — ABNORMAL HIGH (ref 0–37)
Albumin: 4.5 g/dL (ref 3.5–5.2)
BUN: 23 mg/dL (ref 6–23)
CO2: 21 mEq/L (ref 19–32)
Calcium: 9.8 mg/dL (ref 8.4–10.5)
Chloride: 103 mEq/L (ref 96–112)
Creat: 1.25 mg/dL — ABNORMAL HIGH (ref 0.50–1.10)
Glucose, Bld: 89 mg/dL (ref 70–99)
POTASSIUM: 4.6 meq/L (ref 3.5–5.3)
Sodium: 139 mEq/L (ref 135–145)
Total Bilirubin: 1.1 mg/dL (ref 0.2–1.2)
Total Protein: 7.5 g/dL (ref 6.0–8.3)

## 2014-10-20 LAB — CBC
HEMATOCRIT: 41 % (ref 36.0–46.0)
Hemoglobin: 13.8 g/dL (ref 12.0–15.0)
MCH: 29.3 pg (ref 26.0–34.0)
MCHC: 33.7 g/dL (ref 30.0–36.0)
MCV: 87 fL (ref 78.0–100.0)
MPV: 10.6 fL (ref 8.6–12.4)
Platelets: 174 10*3/uL (ref 150–400)
RBC: 4.71 MIL/uL (ref 3.87–5.11)
RDW: 14.4 % (ref 11.5–15.5)
WBC: 7.6 10*3/uL (ref 4.0–10.5)

## 2014-10-20 LAB — TSH: TSH: 6.611 u[IU]/mL — ABNORMAL HIGH (ref 0.350–4.500)

## 2014-10-21 ENCOUNTER — Other Ambulatory Visit: Payer: Self-pay | Admitting: *Deleted

## 2014-10-21 DIAGNOSIS — Z45010 Encounter for checking and testing of cardiac pacemaker pulse generator [battery]: Secondary | ICD-10-CM

## 2014-10-26 ENCOUNTER — Encounter (HOSPITAL_COMMUNITY)
Admission: RE | Disposition: A | Discharge status: Home / Self Care | Payer: Self-pay | Source: Ambulatory Visit | Attending: Cardiovascular Disease

## 2014-10-26 ENCOUNTER — Encounter (HOSPITAL_COMMUNITY): Admission: RE | Payer: Self-pay | Source: Ambulatory Visit

## 2014-10-26 ENCOUNTER — Encounter (HOSPITAL_COMMUNITY)
Admission: RE | Disposition: A | Discharge status: Home / Self Care | Payer: Medicare Other | Source: Ambulatory Visit | Attending: Cardiovascular Disease

## 2014-10-26 ENCOUNTER — Other Ambulatory Visit: Payer: Self-pay

## 2014-10-26 ENCOUNTER — Ambulatory Visit (HOSPITAL_COMMUNITY)
Admission: RE | Admit: 2014-10-26 | Discharge: 2014-10-27 | Disposition: A | Discharge status: Home / Self Care | Payer: Medicare Other | Source: Ambulatory Visit | Attending: Cardiovascular Disease | Admitting: Cardiovascular Disease

## 2014-10-26 ENCOUNTER — Encounter (HOSPITAL_COMMUNITY): Payer: Self-pay | Admitting: *Deleted

## 2014-10-26 ENCOUNTER — Ambulatory Visit (HOSPITAL_COMMUNITY): Admission: RE | Admit: 2014-10-26 | Payer: Medicare Other | Source: Ambulatory Visit | Admitting: Cardiovascular Disease

## 2014-10-26 DIAGNOSIS — R778 Other specified abnormalities of plasma proteins: Secondary | ICD-10-CM | POA: Diagnosis not present

## 2014-10-26 DIAGNOSIS — Z955 Presence of coronary angioplasty implant and graft: Secondary | ICD-10-CM | POA: Diagnosis not present

## 2014-10-26 DIAGNOSIS — Z79899 Other long term (current) drug therapy: Secondary | ICD-10-CM | POA: Diagnosis not present

## 2014-10-26 DIAGNOSIS — E782 Mixed hyperlipidemia: Secondary | ICD-10-CM | POA: Diagnosis not present

## 2014-10-26 DIAGNOSIS — E039 Hypothyroidism, unspecified: Secondary | ICD-10-CM | POA: Diagnosis not present

## 2014-10-26 DIAGNOSIS — R0989 Other specified symptoms and signs involving the circulatory and respiratory systems: Secondary | ICD-10-CM | POA: Insufficient documentation

## 2014-10-26 DIAGNOSIS — I2510 Atherosclerotic heart disease of native coronary artery without angina pectoris: Secondary | ICD-10-CM | POA: Diagnosis not present

## 2014-10-26 DIAGNOSIS — Z8249 Family history of ischemic heart disease and other diseases of the circulatory system: Secondary | ICD-10-CM | POA: Insufficient documentation

## 2014-10-26 DIAGNOSIS — I480 Paroxysmal atrial fibrillation: Secondary | ICD-10-CM | POA: Diagnosis present

## 2014-10-26 DIAGNOSIS — T82897A Other specified complication of cardiac prosthetic devices, implants and grafts, initial encounter: Secondary | ICD-10-CM | POA: Insufficient documentation

## 2014-10-26 DIAGNOSIS — R946 Abnormal results of thyroid function studies: Secondary | ICD-10-CM | POA: Diagnosis present

## 2014-10-26 DIAGNOSIS — M810 Age-related osteoporosis without current pathological fracture: Secondary | ICD-10-CM | POA: Insufficient documentation

## 2014-10-26 DIAGNOSIS — Z45010 Encounter for checking and testing of cardiac pacemaker pulse generator [battery]: Secondary | ICD-10-CM | POA: Diagnosis not present

## 2014-10-26 DIAGNOSIS — Z950 Presence of cardiac pacemaker: Secondary | ICD-10-CM | POA: Diagnosis present

## 2014-10-26 DIAGNOSIS — I495 Sick sinus syndrome: Secondary | ICD-10-CM | POA: Diagnosis not present

## 2014-10-26 DIAGNOSIS — I10 Essential (primary) hypertension: Secondary | ICD-10-CM | POA: Diagnosis not present

## 2014-10-26 DIAGNOSIS — E785 Hyperlipidemia, unspecified: Secondary | ICD-10-CM | POA: Diagnosis present

## 2014-10-26 DIAGNOSIS — Z9861 Coronary angioplasty status: Secondary | ICD-10-CM

## 2014-10-26 DIAGNOSIS — I352 Nonrheumatic aortic (valve) stenosis with insufficiency: Secondary | ICD-10-CM | POA: Diagnosis not present

## 2014-10-26 DIAGNOSIS — Z7982 Long term (current) use of aspirin: Secondary | ICD-10-CM | POA: Diagnosis not present

## 2014-10-26 DIAGNOSIS — R7989 Other specified abnormal findings of blood chemistry: Secondary | ICD-10-CM

## 2014-10-26 DIAGNOSIS — I252 Old myocardial infarction: Secondary | ICD-10-CM | POA: Insufficient documentation

## 2014-10-26 HISTORY — PX: EP IMPLANTABLE DEVICE: SHX172B

## 2014-10-26 LAB — CK TOTAL AND CKMB (NOT AT ARMC)
CK TOTAL: 93 U/L (ref 38–234)
CK, MB: 2.4 ng/mL (ref 0.5–5.0)
Relative Index: INVALID (ref 0.0–2.5)

## 2014-10-26 LAB — SURGICAL PCR SCREEN
MRSA, PCR: NEGATIVE
Staphylococcus aureus: NEGATIVE

## 2014-10-26 LAB — T4, FREE: Free T4: 0.6 ng/dL — ABNORMAL LOW (ref 0.61–1.12)

## 2014-10-26 LAB — TROPONIN I

## 2014-10-26 SURGERY — PPM/BIV PPM GENERATOR CHANGEOUT

## 2014-10-26 SURGERY — PPM/BIV PPM GENERATOR CHANGEOUT
Anesthesia: LOCAL

## 2014-10-26 SURGERY — PERMANENT PACEMAKER GENERATOR CHANGE

## 2014-10-26 MED ORDER — SODIUM CHLORIDE 0.9 % IR SOLN
80.0000 mg | Status: DC
  Filled 2014-10-26: qty 2

## 2014-10-26 MED ORDER — PANTOPRAZOLE SODIUM 40 MG PO TBEC
40.0000 mg | DELAYED_RELEASE_TABLET | Freq: Every day | ORAL | Status: DC
  Administered 2014-10-27: 10:00:00 40 mg via ORAL
  Filled 2014-10-26: qty 1

## 2014-10-26 MED ORDER — HEPARIN (PORCINE) IN NACL 2-0.9 UNIT/ML-% IJ SOLN
INTRAMUSCULAR | Status: AC
  Filled 2014-10-26: qty 1000

## 2014-10-26 MED ORDER — CEFAZOLIN SODIUM-DEXTROSE 2-3 GM-% IV SOLR: 2.0000 g | INTRAVENOUS | Status: DC

## 2014-10-26 MED ORDER — MIDAZOLAM HCL 5 MG/5ML IJ SOLN
INTRAMUSCULAR | Status: DC | PRN
Start: 2014-10-26 — End: 2014-10-26
  Administered 2014-10-26: 1 mg via INTRAVENOUS

## 2014-10-26 MED ORDER — CEFAZOLIN SODIUM 1-5 GM-% IV SOLN
1.0000 g | Freq: Four times a day (QID) | INTRAVENOUS | Status: AC
  Administered 2014-10-26 – 2014-10-27 (×3): 1 g via INTRAVENOUS
  Filled 2014-10-26 (×3): qty 50

## 2014-10-26 MED ORDER — ACETAMINOPHEN 325 MG PO TABS: 325.0000 mg | ORAL_TABLET | ORAL | Status: DC | PRN

## 2014-10-26 MED ORDER — FENTANYL CITRATE (PF) 100 MCG/2ML IJ SOLN
INTRAMUSCULAR | Status: AC
  Filled 2014-10-26: qty 2

## 2014-10-26 MED ORDER — SODIUM CHLORIDE 0.9 % IV SOLN
INTRAVENOUS | Status: DC
Start: 2014-10-26 — End: 2014-10-27

## 2014-10-26 MED ORDER — YOU HAVE A PACEMAKER BOOK
Freq: Once | Status: AC
  Administered 2014-10-26: 20:00:00
  Filled 2014-10-26: qty 1

## 2014-10-26 MED ORDER — SODIUM CHLORIDE 0.9 % IV SOLN
INTRAVENOUS | Status: DC
  Administered 2014-10-26: 13:00:00 via INTRAVENOUS

## 2014-10-26 MED ORDER — SIMVASTATIN 20 MG PO TABS
20.0000 mg | ORAL_TABLET | Freq: Every day | ORAL | Status: DC
  Administered 2014-10-26: 20 mg via ORAL
  Filled 2014-10-26: qty 1

## 2014-10-26 MED ORDER — MIDAZOLAM HCL 5 MG/5ML IJ SOLN
INTRAMUSCULAR | Status: AC
  Filled 2014-10-26: qty 5

## 2014-10-26 MED ORDER — SODIUM CHLORIDE 0.9 % IJ SOLN: 3.0000 mL | INTRAMUSCULAR | Status: DC | PRN

## 2014-10-26 MED ORDER — DEXTROSE 5 % IV SOLN
2.0000 g | INTRAVENOUS | Status: DC | PRN
  Administered 2014-10-26: 2 g via INTRAVENOUS

## 2014-10-26 MED ORDER — SODIUM CHLORIDE 0.9 % IV SOLN: INTRAVENOUS | Status: DC

## 2014-10-26 MED ORDER — FENTANYL CITRATE (PF) 100 MCG/2ML IJ SOLN
INTRAMUSCULAR | Status: DC | PRN
  Administered 2014-10-26: 25 ug via INTRAVENOUS

## 2014-10-26 MED ORDER — ASPIRIN 81 MG PO TABS: 81.0000 mg | ORAL_TABLET | ORAL | Status: DC

## 2014-10-26 MED ORDER — POTASSIUM CHLORIDE CRYS ER 20 MEQ PO TBCR
20.0000 meq | EXTENDED_RELEASE_TABLET | Freq: Every day | ORAL | Status: DC
  Administered 2014-10-26 – 2014-10-27 (×2): 20 meq via ORAL
  Filled 2014-10-26 (×2): qty 1

## 2014-10-26 MED ORDER — ALPRAZOLAM 0.25 MG PO TABS: 0.2500 mg | ORAL_TABLET | Freq: Every evening | ORAL | Status: DC | PRN

## 2014-10-26 MED ORDER — TRAMADOL HCL 50 MG PO TABS
50.0000 mg | ORAL_TABLET | Freq: Four times a day (QID) | ORAL | Status: DC | PRN
  Administered 2014-10-26: 50 mg via ORAL
  Filled 2014-10-26: qty 1

## 2014-10-26 MED ORDER — ASPIRIN 81 MG PO CHEW
81.0000 mg | CHEWABLE_TABLET | Freq: Every day | ORAL | Status: DC
  Administered 2014-10-27: 81 mg via ORAL
  Filled 2014-10-26: qty 1

## 2014-10-26 MED ORDER — ASPIRIN 81 MG PO CHEW: 81.0000 mg | CHEWABLE_TABLET | Freq: Every day | ORAL | Status: DC

## 2014-10-26 MED ORDER — HYDROCHLOROTHIAZIDE 12.5 MG PO CAPS
12.5000 mg | ORAL_CAPSULE | Freq: Every day | ORAL | Status: DC
  Administered 2014-10-27: 10:00:00 12.5 mg via ORAL
  Filled 2014-10-26: qty 1

## 2014-10-26 MED ORDER — NITROGLYCERIN 0.4 MG SL SUBL
0.4000 mg | SUBLINGUAL_TABLET | SUBLINGUAL | Status: DC | PRN
  Administered 2014-10-26: 0.4 mg via SUBLINGUAL
  Filled 2014-10-26: qty 25

## 2014-10-26 MED ORDER — ONDANSETRON HCL 4 MG/2ML IJ SOLN: 4.0000 mg | Freq: Four times a day (QID) | INTRAMUSCULAR | Status: DC | PRN

## 2014-10-26 MED ORDER — NITROGLYCERIN 0.4 MG SL SUBL
0.4000 mg | SUBLINGUAL_TABLET | SUBLINGUAL | Status: DC | PRN
Start: 2014-10-26 — End: 2014-10-27

## 2014-10-26 MED ORDER — LIDOCAINE HCL (PF) 1 % IJ SOLN
INTRAMUSCULAR | Status: AC
  Filled 2014-10-26: qty 60

## 2014-10-26 MED ORDER — LOSARTAN POTASSIUM 50 MG PO TABS
50.0000 mg | ORAL_TABLET | Freq: Every day | ORAL | Status: DC
  Administered 2014-10-27: 10:00:00 50 mg via ORAL
  Filled 2014-10-26: qty 1

## 2014-10-26 MED ORDER — CEFAZOLIN SODIUM-DEXTROSE 2-3 GM-% IV SOLR
INTRAVENOUS | Status: AC
  Filled 2014-10-26: qty 50

## 2014-10-26 MED ORDER — MUPIROCIN 2 % EX OINT: 1.0000 "application " | TOPICAL_OINTMENT | Freq: Once | CUTANEOUS | Status: DC

## 2014-10-26 MED ORDER — LOSARTAN POTASSIUM-HCTZ 50-12.5 MG PO TABS: 1.0000 | ORAL_TABLET | Freq: Every day | ORAL | Status: DC

## 2014-10-26 MED ORDER — METOPROLOL TARTRATE 25 MG PO TABS
25.0000 mg | ORAL_TABLET | Freq: Two times a day (BID) | ORAL | Status: DC
  Administered 2014-10-26 – 2014-10-27 (×2): 25 mg via ORAL
  Filled 2014-10-26 (×2): qty 1

## 2014-10-26 SURGICAL SUPPLY — 6 items
CABLE SURGICAL S-101-97-12 (CABLE) ×1 IMPLANT
LEAD TENDRIL SDX 2088TC-52CM (Lead) ×1 IMPLANT
PAD DEFIB LIFELINK (PAD) ×1 IMPLANT
PPM ASSURITY DR PM2240 (Pacemaker) ×1 IMPLANT
SHEATH CLASSIC 7F (SHEATH) ×1 IMPLANT
TRAY PACEMAKER INSERTION (CUSTOM PROCEDURE TRAY) ×1 IMPLANT

## 2014-10-26 NOTE — H&P (View-Only) (Signed)
Patient ID: [name], female   DOB: 08/01/1927, 81 y.o.   MRN: [id number]     Cardiology Office Note   Date:  10/19/2014   ID:  [name], DOB 08/04/1927, MRN [id number]  PCP:  Graham Duncan, MD  Cardiologist:   Dusan Lipford, MD   Chief Complaint  Patient presents with  . Follow-up    6 months:  No complaints of chest pain, SOB, edema and infreq. dizziness.      History of Present Illness: [name] is a 81 y.o. female who presents for CAD, pacemaker followup, remote history of atrial fibrillation  [name] has a remote history of coronary disease (inferior wall MI with drug-eluting stent to the right coronary artery in 2008, repeat stenting of the distal RCA later the same year),  sinus node dysfunction s/p pacemaker (dual chamber St. Jude Zephyr, programmed AAI, implanted in 2008) and remote paroxysmal atrial fibrillation.   Interrogation of her pacemaker shows that her device reached elective replacement indicator almost 4 months ago. She has chronic problems with her right ventricular lead which produces diaphragmatic stimulation. For this reason she has been programmed AAI rather than DDD.  She has not had any coronary problems since that initial presentation and her most recent nuclear stress test performed 2014 showed no evidence of scar or ischemia. Her EF is normal and she has not had problems with congestive heart failure. Has treated hypertension and takes a statin for hyperlipidemia.   Sustained atrial fibrillation has apparently not been recorded in a very long time. She is not taking anticoagulants but does take aspirin. No atrial fibrillation is detected at today's device check   She also has a known mild aortic valve stenosis and mild aortic insufficiency.     Past Medical History  Diagnosis Date  . Arthritis   . Allergy   . Hyperlipidemia   . Hypertension   . CAD (coronary artery disease)   . Myocardial infarction   . Heart murmur   .  Insomnia   . Fatty liver     on u/s 2010  . Osteoporosis     dxa 2013  . SSS (sick sinus syndrome)     St. Jude Permanent Pacemaker 09/23/06  . PAF (paroxysmal atrial fibrillation)   . Symptomatic bradycardia     Past Surgical History  Procedure Laterality Date  . Pacemaker insertion  09/23/06    St.Jude  . Cholecystectomy    . Back surgery    . Carpal tunnel release      left  . Foot surgery      Left  . Hernia repair      R inguinal  . Coronary angioplasty with stent placement  12/03/06    Stent to distal RCA  . Coronary angioplasty with stent placement  09/12/06    Stenting mid distal segment & PTCA alone distal RCA prox. to the posterior descending artery takeoff.  . Us echocardiography  11/26/11    prox. septal thickening, mild concentric LVH, mod. ca+ AOV,trace AI,mild to mod valvular aortic stenosis.     Current Outpatient Prescriptions  Medication Sig Dispense Refill  . ALPRAZolam (XANAX) 0.25 MG tablet TAKE ONE TABLET BY MOUTH AT BEDTIME AS NEEDED 30 tablet 5  . aspirin 81 MG tablet Take 81 mg by mouth every other day.     . calcium-vitamin D (OSCAL WITH D) 500-200 MG-UNIT per tablet Take 1 tablet by mouth daily as needed.     . Cholecalciferol   1000 UNITS capsule Take 2,000 Units by mouth daily as needed.     . losartan-hydrochlorothiazide (HYZAAR) 50-12.5 MG per tablet Take 1 tablet by mouth daily. 90 tablet 3  . metoprolol tartrate (LOPRESSOR) 25 MG tablet Take 1 tablet (25 mg total) by mouth 2 (two) times daily. 180 tablet 3  . Multiple Vitamin (MULTIVITAMIN) tablet Take 1 tablet by mouth daily as needed.     . nitroGLYCERIN (NITROSTAT) 0.4 MG SL tablet Place 1 tablet (0.4 mg total) under the tongue every 5 (five) minutes as needed. 25 tablet 12  . omeprazole (PRILOSEC) 40 MG capsule Take 1 capsule (40 mg total) by mouth daily as needed. 90 capsule 3  . polyethylene glycol (MIRALAX / GLYCOLAX) packet Take 17 g by mouth daily as needed.     . potassium chloride  (K-DUR) 10 MEQ tablet Take 1 tablet (10 mEq total) by mouth daily. 90 tablet 3  . simvastatin (ZOCOR) 20 MG tablet Take 1 tablet (20 mg total) by mouth at bedtime. 90 tablet 3  . traMADol (ULTRAM) 50 MG tablet TAKE ONE TABLET BY MOUTH 3 TIMES DAILY 90 tablet 5  . triamcinolone cream (KENALOG) 0.1 % Apply 1 application topically 2 (two) times daily as needed. For rash 30 g 0   No current facility-administered medications for this visit.    Allergies:   Fosamax    Social History:  The patient  reports that she has never smoked. She has never used smokeless tobacco. She reports that she does not drink alcohol or use illicit drugs.   Family History:  The patient's family history includes Diabetes in her father; Early death in her mother; Heart disease in her father.    ROS:  Please see the history of present illness.    Otherwise, review of systems positive for none.   All other systems are reviewed and negative.    PHYSICAL EXAM: VS:  BP 138/82 mmHg  Pulse 62  Ht 4' 11" (1.499 m)  Wt 124 lb 6.4 oz (56.427 kg)  BMI 25.11 kg/m2 , BMI Body mass index is 25.11 kg/(m^2).  General: Alert, oriented x3, no distress  Head: no evidence of trauma, PERRL, EOMI, no exophtalmos or lid lag, no myxedema, no xanthelasma; normal ears, nose and oropharynx  Neck: normal jugular venous pulsations and no hepatojugular reflux; brisk carotid pulses without delay and new loud right carotid bruit  Chest: clear to auscultation, no signs of consolidation by percussion or palpation, normal fremitus, symmetrical and full respiratory excursions, of the subclavian pacemaker site  Cardiovascular: normal position and quality of the apical impulse, regular rhythm, normal first and second heart sounds, no rubs or gallops, grade 2/6 early peaking systolic ejection murmur. I do not hear a diastolic murmur  Abdomen: no tenderness or distention, no masses by palpation, no abnormal pulsatility or arterial bruits, normal  bowel sounds, no hepatosplenomegaly  Extremities: no clubbing, cyanosis or edema; 2+ radial, ulnar and brachial pulses bilaterally; 2+ right femoral, posterior tibial and dorsalis pedis pulses; 2+ left femoral, posterior tibial and dorsalis pedis pulses; no subclavian or femoral bruits  Neurological: grossly nonfocal  Psych: euthymic mood, full affect   EKG:  EKG is ordered today. The ekg ordered today demonstrates atrial paced, ventricular sensed otherwise normal   Recent Labs: 12/11/2013: ALT 28; BUN 26*; Creatinine 1.1; Potassium 4.4; Sodium 138    Lipid Panel    Component Value Date/Time   CHOL 154 12/11/2013 1006   TRIG 157.0* 12/11/2013 1006     HDL 51.80 12/11/2013 1006   CHOLHDL 3 12/11/2013 1006   VLDL 31.4 12/11/2013 1006   LDLCALC 71 12/11/2013 1006      Wt Readings from Last 3 Encounters:  10/19/14 124 lb 6.4 oz (56.427 kg)  07/12/14 120 lb 12 oz (54.772 kg)  01/26/14 118 lb 3.2 oz (53.615 kg)      ASSESSMENT AND PLAN:  1.  Dual-chamber permanent pacemaker at elective replacement indicator - needs both a new pacemaker generator and a new right ventricular lead due to problems with right ventricular lead diaphragmatic stimulation. Discussed the potential risks and complications of the procedure including the risk of pneumothorax, lead dislodgment and need for reoperation, perforation, infection, etc. She understands and agrees to proceed.  2. Symptomatic bradycardia to sinus node dysfunction.  3. Reported history of paroxysmal atrial fibrillation, not detected in many years despite pacemaker monitoring. She is not taking anticoagulation but is on aspirin.  4. CAD - It has been over 7 years she has received drug-eluting stents to the mid and distal right coronary artery without recurrent coronary problems. At the time of her MI in 2008 she did not have any disease in the LAD artery and had a 95% stenosis in a secondary branch of a small left circumflex coronary  artery.   4. Hyperlipidemia - good total cholesterol and LDL cholesterol levels with borderline hypertriglyceridemia.  5. Right carotid bruit - very mild plaque by duplex ultrasonography no obstruction  6. Minimal aortic stenosis and trivial aortic insufficiency by previous echocardiogram   Current medicines are reviewed at length with the patient today.  The patient does not have concerns regarding medicines.  The following changes have been made:  no change  Labs/ tests ordered today include:  Orders Placed This Encounter  Procedures  . Comp Met (CMET)  . TSH  . CBC  . INR/PT  . PTT  . Implantable device check  . EKG 12-Lead  . PACEMAKER GENERATOR CHANGE   Patient Instructions  Your physician has recommended that you have a pacemaker generator change out w/new RV lead. A pacemaker is a small device that is placed under the skin of your chest or abdomen to help control abnormal heart rhythms. This device uses electrical pulses to prompt the heart to beat at a normal rate. Pacemakers are used to treat heart rhythms that are too slow. Wire (leads) are attached to the pacemaker that goes into the chambers of you heart. This is done in the hospital and usually requires and overnight stay. Please see the instruction sheet given to you today for more information.  Your physician recommends that you return for lab work within 7 days of the procedure date.       Signed, Zahirah Cheslock, MD  10/19/2014 2:56 PM    Minnie Legros, MD, FACC [facility] [phone] office [phone] pager   

## 2014-10-26 NOTE — Interval H&P Note (Signed)
History and Physical Interval Note:  10/26/2014 11:39 AM  [name]  has presented today for surgery, with the diagnosis of ERI  The various methods of treatment have been discussed with the patient and family. After consideration of risks, benefits and other options for treatment, the patient has consented to  Procedure(s): PPM Generator Changeout (N/A) Pacemaker Revision- new RV Lead (N/A) as a surgical intervention .  The patient's history has been reviewed, patient examined, no change in status, stable for surgery.  I have reviewed the patient's chart and labs.  Questions were answered to the patient's satisfaction.     [name]

## 2014-10-26 NOTE — Progress Notes (Signed)
She had chest pressure at rest while in short stay. She does not recall if this is the same discomfort as her old angina. No ischemic changes on ECG. Equivocal response to SL NTG. Will monitor cardiac enzymes and observe overnight. At this point no high risk features. Will proceed as planned with her pacemaker. Incidental note of borderline elevated TSH. Will check free T4 with next blood draw. Sanda Klein, MD, [facility] [phone] office [phone] pager

## 2014-10-27 ENCOUNTER — Encounter (HOSPITAL_COMMUNITY): Payer: Self-pay | Admitting: Cardiovascular Disease

## 2014-10-27 ENCOUNTER — Ambulatory Visit (HOSPITAL_COMMUNITY): Payer: Medicare Other

## 2014-10-27 ENCOUNTER — Other Ambulatory Visit: Payer: Self-pay | Admitting: Cardiology

## 2014-10-27 DIAGNOSIS — I495 Sick sinus syndrome: Secondary | ICD-10-CM | POA: Diagnosis not present

## 2014-10-27 DIAGNOSIS — I2510 Atherosclerotic heart disease of native coronary artery without angina pectoris: Secondary | ICD-10-CM

## 2014-10-27 DIAGNOSIS — R778 Other specified abnormalities of plasma proteins: Secondary | ICD-10-CM

## 2014-10-27 DIAGNOSIS — Z45010 Encounter for checking and testing of cardiac pacemaker pulse generator [battery]: Secondary | ICD-10-CM | POA: Diagnosis not present

## 2014-10-27 DIAGNOSIS — R946 Abnormal results of thyroid function studies: Secondary | ICD-10-CM | POA: Diagnosis present

## 2014-10-27 DIAGNOSIS — R7989 Other specified abnormal findings of blood chemistry: Secondary | ICD-10-CM

## 2014-10-27 DIAGNOSIS — I480 Paroxysmal atrial fibrillation: Secondary | ICD-10-CM | POA: Diagnosis present

## 2014-10-27 DIAGNOSIS — Z9861 Coronary angioplasty status: Principal | ICD-10-CM

## 2014-10-27 DIAGNOSIS — M810 Age-related osteoporosis without current pathological fracture: Secondary | ICD-10-CM | POA: Diagnosis not present

## 2014-10-27 DIAGNOSIS — R079 Chest pain, unspecified: Secondary | ICD-10-CM | POA: Diagnosis not present

## 2014-10-27 DIAGNOSIS — I10 Essential (primary) hypertension: Secondary | ICD-10-CM | POA: Diagnosis not present

## 2014-10-27 DIAGNOSIS — E782 Mixed hyperlipidemia: Secondary | ICD-10-CM | POA: Diagnosis not present

## 2014-10-27 LAB — TROPONIN I
TROPONIN I: 0.1 ng/mL — AB (ref <0.031)
Troponin I: 0.1 ng/mL — ABNORMAL HIGH (ref <0.031)

## 2014-10-27 MED ORDER — ACETAMINOPHEN 325 MG PO TABS: 325.0000 mg | ORAL_TABLET | ORAL | Status: DC | PRN

## 2014-10-27 NOTE — Discharge Instructions (Signed)

## 2014-10-27 NOTE — Progress Notes (Signed)
Patient Name: [name] Date of Encounter: 10/27/2014  Principal Problem:   Pacemaker battery depletion Active Problems:   Pacemaker - dual chamber St. Jude Zephyr 2008   Diaphragmatic stimulation by pacemaker   Length of Stay:   SUBJECTIVE No chest pain. Surgical site is healthy. Favorable lead parameters. CXR OK, no pneumothorax. Marginal abnormality in troponin. Atypical chest discomfort preceded the implantation, but no ECG changes. Will schedule for [facility]. Could troponin even go up from active fixation lead placement? Mildly abnormal TFTs suggest borderline hypothyroidism - will arrange outpatient follow up.  CURRENT MEDS . aspirin  81 mg Oral Daily  .  ceFAZolin (ANCEF) IV  1 g Intravenous Q6H  . hydrochlorothiazide  12.5 mg Oral Daily  . losartan  50 mg Oral Daily  . metoprolol tartrate  25 mg Oral BID  . pantoprazole  40 mg Oral Daily  . potassium chloride SA  20 mEq Oral Daily  . simvastatin  20 mg Oral QHS    OBJECTIVE   Intake/Output Summary (Last 24 hours) at 10/27/14 0816 Last data filed at 10/27/14 0420  Gross per 24 hour  Intake    630 ml  Output    600 ml  Net     30 ml   Filed Weights   10/27/14 0420  Weight: 123 lb 7.3 oz (56 kg)    PHYSICAL EXAM Filed Vitals:   10/26/14 1944 10/26/14 2000 10/27/14 0010 10/27/14 0420  BP: 109/55 112/45 109/46 144/58  Pulse: 73 60 60 61  Temp: 97.6 F (36.4 C)  98 F (36.7 C) 98 F (36.7 C)  TempSrc: Oral  Oral Oral  Resp: 19 21 18 18   Weight:    123 lb 7.3 oz (56 kg)  SpO2: 97% 99% 94% 96%   General: Alert, oriented x3, no distress Head: no evidence of trauma, PERRL, EOMI, no exophtalmos or lid lag, no myxedema, no xanthelasma; normal ears, nose and oropharynx Neck: normal jugular venous pulsations and no hepatojugular reflux; brisk carotid pulses without delay and no carotid bruits Chest: clear to auscultation, no signs of consolidation by percussion or palpation, normal fremitus,  symmetrical and full respiratory excursions. Healthy surgical site. Cardiovascular: normal position and quality of the apical impulse, regular rhythm, normal first and second heart sounds, no rubs or gallops, no murmur Abdomen: no tenderness or distention, no masses by palpation, no abnormal pulsatility or arterial bruits, normal bowel sounds, no hepatosplenomegaly Extremities: no clubbing, cyanosis or edema; 2+ radial, ulnar and brachial pulses bilaterally; 2+ right femoral, posterior tibial and dorsalis pedis pulses; 2+ left femoral, posterior tibial and dorsalis pedis pulses; no subclavian or femoral bruits Neurological: grossly nonfocal  LABS Cardiac Enzymes  Recent Labs  10/26/14 1407 10/27/14 0134  CKTOTAL 93  --   CKMB 2.4  --   TROPONINI <0.03 0.10*   Radiology Studies Imaging results have been reviewed and Dg Chest 2 View  10/27/2014   CLINICAL DATA:  Post pacemaker.  Right upper chest pain  EXAM: CHEST  2 VIEW  COMPARISON:  04/20/2009  FINDINGS: There are intact appearances of the transvenous pacer leads. There is no pneumothorax. The lungs are clear. There is no pleural effusion. Pulmonary vasculature is normal.  IMPRESSION: Unremarkable appearances of the transvenous pacing leads. No pneumothorax. No acute cardiopulmonary findings.   Electronically Signed   By: Andreas Newport M.D.   On: 10/27/2014 06:17    TELE A paced (88%) V sensed  ECG A paced, otherwise normal, no ST-T  changes  ASSESSMENT AND PLAN Significance of minimally increased troponin is uncertain, but with her history of CAD, further workup is justified. Currently asymptomatic and normal ECG. DC home. Wound check 7-10 days. Lexiscan Myoview no sooner than 2 weeks to avoid raising right arm above head. Office pacemaker check in 4-8 weeks. Will defer treatment of very mild hypothyroidism (has fatigue) to Dr. Damita Dunnings.   Sanda Klein, MD, [facility] [phone] office [phone]  pager 10/27/2014 8:16 AM

## 2014-10-27 NOTE — Discharge Summary (Signed)
Patient ID: [name],  MRN: [id number], DOB/AGE: 79-Oct-1929 79 y.o.  Admit date: 10/26/2014 Discharge date: 10/27/2014  Primary Care Provider: Elsie Stain, MD Primary Cardiologist: Dr Sallyanne Kuster  Discharge Diagnoses Principal Problem:   Pacemaker at EOL, s/p Gen change 10/27/14 Active Problems:   CAD S/P PCI 2008   Pacemaker - dual chamber St. Jude [place] 2008   Troponin level elevated- 0.10 post pacemaker   Essential hypertension, benign   Mixed hyperlipidemia   PAF- none documented in years, not on anticoagulation   Abnormal thyroid function test    Procedures: St Jude generator change and new RV lead placed 10/26/14   Hospital Course:  81 y.o. Female with history of coronary disease (inferior wall MI with drug-eluting stent to the right coronary artery in 2008, repeat stenting of the distal RCA later the same year), sinus node dysfunction s/p pacemaker (dual chamber St. Jude [place], programmed AAI, implanted in 2008) and remote paroxysmal atrial fibrillation. Sustained atrial fibrillation has apparently not been recorded in a very long time. She is not taking anticoagulants but does take aspirin. No atrial fibrillation was detected at her device check.     She was seen in the office 10/19/14 by Dr Sallyanne Kuster for pacemaker follow up. Interogation of her pacemaker showed that her device had reached elective replacement indicator almost 4 months ago. She has chronic problems with her right ventricular lead which produces diaphragmatic stimulation. For this reason she has been programmed AAI rather than DDD. It was deiced to admit her for elective generator change and RV lead replacement. This was done 10/26/14 by Dr Sallyanne Kuster- see his OP note for complete details.       She did have some chest pain in the holding area and her second Troponin was 0.10.  She has not had any coronary problems since that initial presentation in 2008 and her most recent nuclear stress test performed 2014 showed  no evidence of scar or ischemia. Dr Sallyanne Kuster would like to repeat her Myoview in two weeks as an OP. She was also noted to have an elevated TSH and low T4. Dr Sallyanne Kuster has instructed her to follow up with her PCP, Dr Damita Dunnings, about this. The pt will have a site check in 7-10 days, a Lexiscan in 2 weeks, and an OV with Dr Sallyanne Kuster in 6 weeks.     Discharge Vitals:  Blood pressure 124/56, pulse 64, temperature 98 F (36.7 C), temperature source Oral, resp. rate 18, weight 123 lb 7.3 oz (56 kg), SpO2 97 %.    Labs: Results for orders placed or performed during the hospital encounter of 10/26/14 (from the past 24 hour(s))  Surgical pcr screen     Status: None   Collection Time: 10/26/14  1:00 PM  Result Value Ref Range   MRSA, PCR NEGATIVE NEGATIVE   Staphylococcus aureus NEGATIVE NEGATIVE   CK total and CKMB (cardiac)     Status: None   Collection Time: 10/26/14  2:07 PM  Result Value Ref Range   Total CK 93 38 - 234 U/L   CK, MB 2.4 0.5 - 5.0 ng/mL   Relative Index RELATIVE INDEX IS INVALID 0.0 - 2.5  Troponin I (q 6hr x 3)     Status: None   Collection Time: 10/26/14  2:07 PM  Result Value Ref Range   Troponin I <0.03 <0.031 ng/mL  T4, free     Status: Abnormal   Collection Time: 10/26/14  6:59 PM  Result  Value Ref Range   Free T4 0.60 (L) 0.61 - 1.12 ng/dL  Troponin I (q 6hr x 3)     Status: Abnormal   Collection Time: 10/27/14  1:34 AM  Result Value Ref Range   Troponin I 0.10 (H) <0.031 ng/mL    Disposition:      Follow-up Information    Follow up with [facility].   Specialty:  Cardiology   Why:  officie will call you pacemaker site check in 7-10 days   Contact information:   [address], [place] [phone]      Follow up with Sanda Klein, MD In 2 weeks.   Specialty:  Cardiology   Why:  office will call you about stress test   Contact information:   [address] [place]  [address] [phone]       Follow up with Elsie Stain, MD In 1 week.   Specialty:  Family Medicine   Why:  call office for an appoinment    Contact information:   [place] [address] [phone]       Discharge Medications:    Medication List    STOP taking these medications        Cholecalciferol 1000 UNITS capsule     potassium chloride 10 MEQ tablet  Commonly known as:  K-DUR     triamcinolone cream 0.1 %  Commonly known as:  KENALOG      TAKE these medications        acetaminophen 325 MG tablet  Commonly known as:  TYLENOL  Take 1-2 tablets (325-650 mg total) by mouth every 4 (four) hours as needed for mild pain.     ALPRAZolam 0.25 MG tablet  Commonly known as:  XANAX  TAKE ONE TABLET BY MOUTH AT BEDTIME AS NEEDED     aspirin 81 MG tablet  Take 81 mg by mouth every other day.     BIOTIN PO  Take 1 tablet by mouth daily.     cetirizine 10 MG tablet  Commonly known as:  ZYRTEC  Take 10 mg by mouth daily as needed for allergies.     losartan-hydrochlorothiazide 50-12.5 MG per tablet  Commonly known as:  HYZAAR  Take 1 tablet by mouth daily.     metoprolol tartrate 25 MG tablet  Commonly known as:  LOPRESSOR  Take 1 tablet (25 mg total) by mouth 2 (two) times daily.     nitroGLYCERIN 0.4 MG SL tablet  Commonly known as:  NITROSTAT  Place 1 tablet (0.4 mg total) under the tongue every 5 (five) minutes as needed.     omeprazole 40 MG capsule  Commonly known as:  PRILOSEC  Take 1 capsule (40 mg total) by mouth daily as needed.     potassium chloride SA 20 MEQ tablet  Commonly known as:  K-DUR,KLOR-CON  Take 20 mEq by mouth daily.     simvastatin 20 MG tablet  Commonly known as:  ZOCOR  Take 1 tablet (20 mg total) by mouth at bedtime.     traMADol 50 MG tablet  Commonly known as:  ULTRAM  TAKE ONE TABLET BY MOUTH 3 TIMES DAILY         Duration of Discharge Encounter: Greater than 30 minutes including physician  time.  Angelena Form PA-C 10/27/2014 9:34 AM

## 2014-11-02 ENCOUNTER — Encounter: Payer: Self-pay | Admitting: Family Medicine

## 2014-11-02 ENCOUNTER — Encounter (HOSPITAL_COMMUNITY): Payer: Self-pay | Admitting: *Deleted

## 2014-11-02 ENCOUNTER — Telehealth (HOSPITAL_COMMUNITY): Payer: Self-pay | Admitting: *Deleted

## 2014-11-02 ENCOUNTER — Ambulatory Visit (INDEPENDENT_AMBULATORY_CARE_PROVIDER_SITE_OTHER): Payer: Medicare Other | Admitting: Family Medicine

## 2014-11-02 VITALS — BP 144/70 | HR 86 | Temp 98.4°F | Wt 124.0 lb

## 2014-11-02 DIAGNOSIS — I2510 Atherosclerotic heart disease of native coronary artery without angina pectoris: Secondary | ICD-10-CM | POA: Diagnosis not present

## 2014-11-02 DIAGNOSIS — E039 Hypothyroidism, unspecified: Secondary | ICD-10-CM

## 2014-11-02 DIAGNOSIS — Z9861 Coronary angioplasty status: Secondary | ICD-10-CM

## 2014-11-02 MED ORDER — LEVOTHYROXINE SODIUM 25 MCG PO TABS: 25.0000 ug | ORAL_TABLET | Freq: Every day | ORAL | Status: DC

## 2014-11-02 NOTE — Patient Instructions (Signed)
Start levothyroid 63mcg a day.  Recheck TSH in about 2 months.  Take care.  Glad to see you.

## 2014-11-02 NOTE — Progress Notes (Signed)
Pre visit review using our clinic review tool, if applicable. No additional management support is needed unless otherwise documented below in the visit note.  Fatigue noted, admitted, had pacer lead changed.  Mild inc in TSH noted.  No neck mass, no dysphagia.  No neck radiation.  Labs reviewed with patient.  Hypothyroidism and tx dw pt.    Meds, vitals, and allergies reviewed.   ROS: See HPI.  Otherwise, noncontributory.  nad ncat Neck supple, no LA No TMG rrr ctab Pacer site CDI No edema

## 2014-11-03 NOTE — Assessment & Plan Note (Signed)
Start 84mcg of levothyroid, recheck in about 2 months.  D/w pt.  She agrees.

## 2014-11-04 ENCOUNTER — Ambulatory Visit (INDEPENDENT_AMBULATORY_CARE_PROVIDER_SITE_OTHER): Payer: Medicare Other | Admitting: *Deleted

## 2014-11-04 DIAGNOSIS — Z950 Presence of cardiac pacemaker: Secondary | ICD-10-CM

## 2014-11-04 DIAGNOSIS — I480 Paroxysmal atrial fibrillation: Secondary | ICD-10-CM

## 2014-11-04 LAB — CUP PACEART INCLINIC DEVICE CHECK
Battery Remaining Longevity: 132 mo
Brady Statistic RA Percent Paced: 62 %
Brady Statistic RV Percent Paced: 0 %
Lead Channel Impedance Value: 562.5 Ohm
Lead Channel Pacing Threshold Amplitude: 0.75 V
Lead Channel Pacing Threshold Amplitude: 1.25 V
Lead Channel Pacing Threshold Pulse Width: 0.4 ms
Lead Channel Pacing Threshold Pulse Width: 0.4 ms
Lead Channel Sensing Intrinsic Amplitude: 12 mV
Lead Channel Setting Pacing Amplitude: 2 V
Lead Channel Setting Sensing Sensitivity: 2 mV
MDC IDC MSMT BATTERY VOLTAGE: 3.16 V
MDC IDC MSMT LEADCHNL RA IMPEDANCE VALUE: 362.5 Ohm
MDC IDC MSMT LEADCHNL RA PACING THRESHOLD AMPLITUDE: 0.75 V
MDC IDC MSMT LEADCHNL RA PACING THRESHOLD PULSEWIDTH: 0.4 ms
MDC IDC MSMT LEADCHNL RA SENSING INTR AMPL: 2.4 mV
MDC IDC MSMT LEADCHNL RV PACING THRESHOLD AMPLITUDE: 1.25 V
MDC IDC MSMT LEADCHNL RV PACING THRESHOLD PULSEWIDTH: 0.4 ms
MDC IDC SESS DTM: 20160512163806
MDC IDC SET LEADCHNL RV PACING AMPLITUDE: 3.5 V
MDC IDC SET LEADCHNL RV PACING PULSEWIDTH: 0.4 ms
Pulse Gen Serial Number: 7766577

## 2014-11-04 NOTE — Progress Notes (Signed)
Changeout wound check appointment. Staples removed. Wound without redness or edema. Incision edges approximated, wound well healed. Normal device function. Thresholds, sensing, and impedances consistent with implant measurements. Device programmed at 3.5V for new RV lead, chronic output for RA lead. Turned on ACap confirm to monitor. Histogram distribution appropriate for patient and level of activity. No mode switches or high ventricular rates noted. Patient educated about wound care, arm mobility, lifting restrictions. ROV w/ [facility] 02/08/15 (pt aware to go to [facility]).

## 2014-11-05 ENCOUNTER — Encounter (HOSPITAL_COMMUNITY): Payer: Self-pay | Admitting: Cardiovascular Disease

## 2014-11-05 ENCOUNTER — Other Ambulatory Visit: Payer: Self-pay

## 2014-11-05 DIAGNOSIS — I10 Essential (primary) hypertension: Secondary | ICD-10-CM

## 2014-11-05 NOTE — Telephone Encounter (Addendum)
[name] pts daughter said pt has [employer] ins now and less expensive for pt to get med thru [facility] rx mail order;(acct has already been set up with [facility]). Pts last med wellness exam was 12/21/2013. [name] is not sure if pt would need to come in 11/2014 for annual wellness exam since pt was seen on 11/02/14 for thyroid issue and recently had pace maker replaced and is scheduled for stress test on 11/16/14. Is it OK to send 1 years refill for losartan HCTZ,simvastatin, omeprazole, metoprolol, ntg and potassium. On Hx med list potassium was stopped at discharge per [name] PA; [name] said that Potassium was not stopped at discharge.Please advise.

## 2014-11-07 MED ORDER — LOSARTAN POTASSIUM-HCTZ 50-12.5 MG PO TABS: 1.0000 | ORAL_TABLET | Freq: Every day | ORAL | Status: DC

## 2014-11-07 MED ORDER — OMEPRAZOLE 40 MG PO CPDR: 40.0000 mg | DELAYED_RELEASE_CAPSULE | Freq: Every day | ORAL | Status: DC | PRN

## 2014-11-07 MED ORDER — METOPROLOL TARTRATE 25 MG PO TABS: 25.0000 mg | ORAL_TABLET | Freq: Two times a day (BID) | ORAL | Status: DC

## 2014-11-07 MED ORDER — POTASSIUM CHLORIDE CRYS ER 20 MEQ PO TBCR: 20.0000 meq | EXTENDED_RELEASE_TABLET | Freq: Every day | ORAL | Status: DC

## 2014-11-07 MED ORDER — NITROGLYCERIN 0.4 MG SL SUBL: 0.4000 mg | SUBLINGUAL_TABLET | SUBLINGUAL | Status: DC | PRN

## 2014-11-07 MED ORDER — SIMVASTATIN 20 MG PO TABS: 20.0000 mg | ORAL_TABLET | Freq: Every day | ORAL | Status: DC

## 2014-11-07 NOTE — Telephone Encounter (Signed)
I would schedule a CPE for the fall.   I would continue meds as is.  The d/c papers I had mentioned stopping potassium.   If she is still on potassium, then continue and get BMET done when possible, either here or cards clinic, in the near future.  Order is in.   I sent all of the meds, potassium included.

## 2014-11-08 NOTE — Telephone Encounter (Signed)
Patient's daughter advised.  Vivien Rota says she will check with Cardiology when she takes her in for her stress test concerning the Potassium and if she is to continue, she will arrange for a BMET to be done.

## 2014-11-11 ENCOUNTER — Telehealth: Payer: Self-pay | Admitting: *Deleted

## 2014-11-11 ENCOUNTER — Telehealth (HOSPITAL_COMMUNITY): Payer: Self-pay | Admitting: *Deleted

## 2014-11-11 NOTE — Telephone Encounter (Signed)
Fax from [employer] requesting clarification of dosage form for Potassium CL TB 20MEQ.  Form in Dr. Josefine Class In [place].

## 2014-11-11 NOTE — Telephone Encounter (Signed)
Left message on voicemail in reference to upcoming appointment scheduled on 11/16/14 at 0730 with detailed instructions given per Myocardial Perfusion Study Information Sheet for the test. Phone number given for call back for any questions. Darriel Utter, Ranae Palms

## 2014-11-12 NOTE — Telephone Encounter (Signed)
This form has been faxed back.  

## 2014-11-12 NOTE — Telephone Encounter (Signed)
Take 1 tablet (20 mEq total) by mouth daily.  Printed on form.  Thanks.

## 2014-11-16 ENCOUNTER — Ambulatory Visit (HOSPITAL_COMMUNITY)
Admission: RE | Admit: 2014-11-16 | Discharge: 2014-11-16 | Disposition: A | Discharge status: Home / Self Care | Payer: Medicare Other | Source: Ambulatory Visit | Attending: Cardiovascular Disease | Admitting: Cardiovascular Disease

## 2014-11-16 ENCOUNTER — Other Ambulatory Visit: Payer: Self-pay | Admitting: Cardiovascular Disease

## 2014-11-16 DIAGNOSIS — I2510 Atherosclerotic heart disease of native coronary artery without angina pectoris: Secondary | ICD-10-CM

## 2014-11-16 DIAGNOSIS — R7989 Other specified abnormal findings of blood chemistry: Secondary | ICD-10-CM

## 2014-11-16 DIAGNOSIS — Z9861 Coronary angioplasty status: Principal | ICD-10-CM

## 2014-11-16 DIAGNOSIS — R778 Other specified abnormalities of plasma proteins: Secondary | ICD-10-CM

## 2014-11-17 ENCOUNTER — Ambulatory Visit (HOSPITAL_COMMUNITY)
Admission: RE | Admit: 2014-11-17 | Discharge: 2014-11-17 | Disposition: A | Discharge status: Home / Self Care | Payer: Medicare Other | Source: Ambulatory Visit | Attending: Cardiovascular Disease | Admitting: Cardiovascular Disease

## 2014-11-17 DIAGNOSIS — I2510 Atherosclerotic heart disease of native coronary artery without angina pectoris: Secondary | ICD-10-CM

## 2014-11-17 DIAGNOSIS — Z9861 Coronary angioplasty status: Secondary | ICD-10-CM | POA: Diagnosis not present

## 2014-11-17 LAB — MYOCARDIAL PERFUSION IMAGING
CHL CUP NUCLEAR SSS: 1
CSEPPHR: 79 {beats}/min
Estimated workload: 1 METS
LV dias vol: 53 mL
LV sys vol: 13 mL
Nuc Stress EF: 75 %
Peak HR: 72 {beats}/min
Percent of predicted max HR: 53 %
Rest HR: 62 {beats}/min
SDS: 1
SRS: 0
Stage 1 Grade: 0 %
Stage 1 HR: 60 {beats}/min
Stage 1 Speed: 0 mph
Stage 2 Grade: 0 %
Stage 2 HR: 60 {beats}/min
Stage 2 Speed: 0 mph
Stage 3 Grade: 0 %
Stage 3 HR: 72 {beats}/min
Stage 3 Speed: 0 mph
Stage 4 DBP: 77 mmHg
Stage 4 Grade: 0 %
Stage 4 HR: 65 {beats}/min
Stage 4 SBP: 163 mmHg
Stage 4 Speed: 0 mph
TID: 1.08

## 2014-11-17 MED ORDER — TECHNETIUM TC 99M SESTAMIBI GENERIC - CARDIOLITE
10.0000 | Freq: Once | INTRAVENOUS | Status: AC | PRN
  Administered 2014-11-17: 10 via INTRAVENOUS

## 2014-11-17 MED ORDER — AMINOPHYLLINE 25 MG/ML IV SOLN
75.0000 mg | Freq: Once | INTRAVENOUS | Status: AC
  Administered 2014-11-17: 75 mg via INTRAVENOUS

## 2014-11-17 MED ORDER — REGADENOSON 0.4 MG/5ML IV SOLN
0.4000 mg | Freq: Once | INTRAVENOUS | Status: AC
  Administered 2014-11-17: 0.4 mg via INTRAVENOUS

## 2014-11-17 MED ORDER — TECHNETIUM TC 99M SESTAMIBI GENERIC - CARDIOLITE
31.0000 | Freq: Once | INTRAVENOUS | Status: AC | PRN
  Administered 2014-11-17: 31 via INTRAVENOUS

## 2014-11-25 ENCOUNTER — Encounter: Payer: Self-pay | Admitting: Cardiovascular Disease

## 2014-12-01 ENCOUNTER — Encounter: Payer: Self-pay | Admitting: Cardiovascular Disease

## 2014-12-13 ENCOUNTER — Encounter: Payer: Self-pay | Admitting: Cardiovascular Disease

## 2014-12-13 ENCOUNTER — Ambulatory Visit (INDEPENDENT_AMBULATORY_CARE_PROVIDER_SITE_OTHER): Payer: Medicare Other | Admitting: Cardiovascular Disease

## 2014-12-13 VITALS — BP 130/64 | HR 60 | Resp 16 | Ht 59.0 in | Wt 125.4 lb

## 2014-12-13 DIAGNOSIS — I480 Paroxysmal atrial fibrillation: Secondary | ICD-10-CM | POA: Diagnosis not present

## 2014-12-13 DIAGNOSIS — Z9861 Coronary angioplasty status: Secondary | ICD-10-CM

## 2014-12-13 DIAGNOSIS — Z950 Presence of cardiac pacemaker: Secondary | ICD-10-CM

## 2014-12-13 DIAGNOSIS — I2510 Atherosclerotic heart disease of native coronary artery without angina pectoris: Secondary | ICD-10-CM | POA: Diagnosis not present

## 2014-12-13 DIAGNOSIS — I10 Essential (primary) hypertension: Secondary | ICD-10-CM

## 2014-12-13 NOTE — Progress Notes (Signed)
Patient ID: [name], female   DOB: 27-Mar-1928, 81 y.o.   MRN: [id number]     Cardiology Office Note   Date:  12/14/2014   ID:  [name], DOB 1927/11/07, MRN [id number]  PCP:  Elsie Stain, MD  Cardiologist:   Sanda Klein, MD   Chief Complaint  Patient presents with  . Follow-up    patient reports no complaints      History of Present Illness: [name] is a 81 y.o. female who presents for all up after a pharmacological myocardial perfusion study and a recent pacemaker generator change out and placement of a new right ventricular lead (she had extra cardiac stimulation with the old lead). The stress test showed normal perfusion and normal left ventricle systolic function. The surgical site has healed well. She has no current cardiovascular complaints. No complaints of diaphragmatic or chest wall stimulation with the new right ventricular lead.  She had a drug-eluting stent placed to the right coronary artery in the setting of an inferior wall myocardial infarction in 2008 and had repeat stenting of the distal right coronary artery later in the same year. She received a dual-chamber permanent pacemaker (St. Jude) in 2008 as well. She has a remote history of paroxysmal atrial fibrillation but has not been documented clinically or by pacemaker follow-up in years. She is not receiving anticoagulation.  Past Medical History  Diagnosis Date  . Arthritis   . Allergy   . Hyperlipidemia   . Hypertension   . CAD (coronary artery disease)   . Myocardial infarction   . Heart murmur   . Insomnia   . Fatty liver     on u/s 2010  . Osteoporosis     dxa 2013  . SSS (sick sinus syndrome)     St. Jude Permanent Pacemaker 09/23/06  . PAF (paroxysmal atrial fibrillation)   . Symptomatic bradycardia     Past Surgical History  Procedure Laterality Date  . Pacemaker insertion  09/23/06    St.Jude  . Cholecystectomy    . Back surgery    . Carpal tunnel release      left  .  Foot surgery      Left  . Hernia repair      R inguinal  . Coronary angioplasty with stent placement  12/03/06    Stent to distal RCA  . Coronary angioplasty with stent placement  09/12/06    Stenting mid distal segment & PTCA alone distal RCA prox. to the posterior descending artery takeoff.  . US echocardiography  11/26/11    prox. septal thickening, mild concentric LVH, mod. ca+ AOV,trace AI,mild to mod valvular aortic stenosis.  Marland Kitchen Ep implantable device N/A 10/26/2014    Procedure: PPM Generator Changeout;  Surgeon: Sanda Klein, MD;  Location: [facility];  Service: Cardiovascular;  Laterality: N/A;  . Ep implantable device N/A 10/26/2014    Procedure: Pacemaker Revision- new RV Lead;  Surgeon: Sanda Klein, MD;  Location: [facility];  Service: Cardiovascular;  Laterality: N/A;     Current Outpatient Prescriptions  Medication Sig Dispense Refill  . acetaminophen (TYLENOL) 325 MG tablet Take 1-2 tablets (325-650 mg total) by mouth every 4 (four) hours as needed for mild pain.    Marland Kitchen ALPRAZolam (XANAX) 0.25 MG tablet TAKE ONE TABLET BY MOUTH AT BEDTIME AS NEEDED 30 tablet 5  . aspirin 81 MG tablet Take 81 mg by mouth every other day.     Marland Kitchen BIOTIN PO  Take 1 tablet by mouth daily.    . cetirizine (ZYRTEC) 10 MG tablet Take 10 mg by mouth daily as needed for allergies.    Marland Kitchen levothyroxine (LEVOTHROID) 25 MCG tablet Take 1 tablet (25 mcg total) by mouth daily before breakfast. 90 tablet 3  . losartan-hydrochlorothiazide (HYZAAR) 50-12.5 MG per tablet Take 1 tablet by mouth daily. 90 tablet 3  . metoprolol tartrate (LOPRESSOR) 25 MG tablet Take 1 tablet (25 mg total) by mouth 2 (two) times daily. 180 tablet 3  . nitroGLYCERIN (NITROSTAT) 0.4 MG SL tablet Place 1 tablet (0.4 mg total) under the tongue every 5 (five) minutes as needed. 25 tablet 12  . omeprazole (PRILOSEC) 40 MG capsule Take 1 capsule (40 mg total) by mouth daily as needed. 90 capsule 3  . potassium chloride SA  (K-DUR,KLOR-CON) 20 MEQ tablet Take 1 tablet (20 mEq total) by mouth daily. 90 tablet 3  . simvastatin (ZOCOR) 20 MG tablet Take 1 tablet (20 mg total) by mouth at bedtime. 90 tablet 3  . traMADol (ULTRAM) 50 MG tablet TAKE ONE TABLET BY MOUTH 3 TIMES DAILY (Patient taking differently: TAKE ONE TABLET BY MOUTH 3 TIMES DAILY AS NEEDED) 90 tablet 5   No current facility-administered medications for this visit.    Allergies:   Fosamax    Social History:  The patient  reports that she has never smoked. She has never used smokeless tobacco. She reports that she does not drink alcohol or use illicit drugs.   Family History:  The patient's family history includes Diabetes in her father; Early death in her mother; Heart disease in her father.    ROS:  Please see the history of present illness.    Otherwise, review of systems positive for none.   All other systems are reviewed and negative.    PHYSICAL EXAM: VS:  BP 130/64 mmHg  Pulse 60  Ht 4\' 11"  (1.499 m)  Wt 125 lb 6.4 oz (56.881 kg)  BMI 25.31 kg/m2 , BMI Body mass index is 25.31 kg/(m^2).  General: Alert, oriented x3, no distress Head: no evidence of trauma, PERRL, EOMI, no exophtalmos or lid lag, no myxedema, no xanthelasma; normal ears, nose and oropharynx Neck: normal jugular venous pulsations and no hepatojugular reflux; brisk carotid pulses without delay and no carotid bruits Chest: clear to auscultation, no signs of consolidation by percussion or palpation, normal fremitus, symmetrical and full respiratory excursions Cardiovascular: normal position and quality of the apical impulse, regular rhythm, normal first and second heart sounds, no murmurs, rubs or gallops Abdomen: no tenderness or distention, no masses by palpation, no abnormal pulsatility or arterial bruits, normal bowel sounds, no hepatosplenomegaly Extremities: no clubbing, cyanosis or edema; 2+ radial, ulnar and brachial pulses bilaterally; 2+ right femoral, posterior  tibial and dorsalis pedis pulses; 2+ left femoral, posterior tibial and dorsalis pedis pulses; no subclavian or femoral bruits Neurological: grossly nonfocal Psych: euthymic mood, full affect   EKG:  EKG is not ordered today.   Recent Labs: 10/19/2014: ALT 39*; BUN 23; Creat 1.25*; Hemoglobin 13.8; Platelets 174; Potassium 4.6; Sodium 139; TSH 6.611*    Lipid Panel    Component Value Date/Time   CHOL 154 12/11/2013 1006   TRIG 157.0* 12/11/2013 1006   HDL 51.80 12/11/2013 1006   CHOLHDL 3 12/11/2013 1006   VLDL 31.4 12/11/2013 1006   LDLCALC 71 12/11/2013 1006      Wt Readings from Last 3 Encounters:  12/13/14 125 lb 6.4 oz (56.881 kg)  11/17/14 124 lb (56.246 kg)  11/16/14 124 lb (56.246 kg)      ASSESSMENT AND PLAN:  1. CAD, s/p inferior wall MI and RCA stents. No current angina, normal stress perfusion study. 2. Hyperlipidemia, well treated. 3. SSS s/p dual chamber pacemaker, recent generator change and new RV lead. Decrease RV outputs at next appointment  Current medicines are reviewed at length with the patient today.  The patient does not have concerns regarding medicines.  The following changes have been made:  no change  Labs/ tests ordered today include:  No orders of the defined types were placed in this encounter.    Patient Instructions  Remote monitoring is used to monitor your Pacemaker of ICD from home. This monitoring reduces the number of office visits required to check your device to one time per year. It allows [place] to keep an eye on the functioning of your device to ensure it is working properly. You are scheduled for a device check from home on February 02, 2015. You may send your transmission at any time that day. If you have a wireless device, the transmission will be sent automatically. After your physician reviews your transmission, you will receive a postcard with your next transmission date.  Dr Sallyanne Kuster recommends that you schedule a follow-up  appointment in 1 year. You will receive a reminder letter in the mail two months in advance. If you don't receive a letter, please call our office to schedule the follow-up appointment.  **Please cancel you August appointment with Dr Sallyanne Kuster.Mikael Spray, MD  12/14/2014 8:26 AM    Sanda Klein, MD, [facility] [phone] office [phone] pager

## 2014-12-13 NOTE — Patient Instructions (Signed)
Remote monitoring is used to monitor your Pacemaker of ICD from home. This monitoring reduces the number of office visits required to check your device to one time per year. It allows [place] to keep an eye on the functioning of your device to ensure it is working properly. You are scheduled for a device check from home on February 02, 2015. You may send your transmission at any time that day. If you have a wireless device, the transmission will be sent automatically. After your physician reviews your transmission, you will receive a postcard with your next transmission date.  Dr Sallyanne Kuster recommends that you schedule a follow-up appointment in 1 year. You will receive a reminder letter in the mail two months in advance. If you don't receive a letter, please call our office to schedule the follow-up appointment.  **Please cancel you August appointment with Dr Sallyanne Kuster.**

## 2014-12-14 ENCOUNTER — Encounter: Payer: Self-pay | Admitting: Cardiovascular Disease

## 2014-12-17 ENCOUNTER — Telehealth: Payer: Self-pay | Admitting: Cardiovascular Disease

## 2014-12-17 NOTE — Telephone Encounter (Signed)
Closed encounter °

## 2015-01-02 ENCOUNTER — Other Ambulatory Visit: Payer: Self-pay | Admitting: Family Medicine

## 2015-01-02 DIAGNOSIS — I10 Essential (primary) hypertension: Secondary | ICD-10-CM

## 2015-01-02 DIAGNOSIS — M810 Age-related osteoporosis without current pathological fracture: Secondary | ICD-10-CM

## 2015-01-04 ENCOUNTER — Other Ambulatory Visit (INDEPENDENT_AMBULATORY_CARE_PROVIDER_SITE_OTHER): Payer: Medicare Other

## 2015-01-04 DIAGNOSIS — I10 Essential (primary) hypertension: Secondary | ICD-10-CM

## 2015-01-04 DIAGNOSIS — M810 Age-related osteoporosis without current pathological fracture: Secondary | ICD-10-CM

## 2015-01-04 LAB — LIPID PANEL
CHOL/HDL RATIO: 4
Cholesterol: 148 mg/dL (ref 0–200)
HDL: 40.6 mg/dL (ref >39.00)
NonHDL: 107.4
Triglycerides: 229 mg/dL — ABNORMAL HIGH (ref 0.0–149.0)
VLDL: 45.8 mg/dL — AB (ref 0.0–40.0)

## 2015-01-04 LAB — LDL CHOLESTEROL, DIRECT: LDL DIRECT: 79 mg/dL

## 2015-01-04 LAB — BASIC METABOLIC PANEL
BUN: 17 mg/dL (ref 6–23)
CALCIUM: 9.9 mg/dL (ref 8.4–10.5)
CO2: 27 mEq/L (ref 19–32)
Chloride: 103 mEq/L (ref 96–112)
Creatinine, Ser: 1.14 mg/dL (ref 0.40–1.20)
GFR: 48.09 mL/min — ABNORMAL LOW (ref >60.00)
GLUCOSE: 90 mg/dL (ref 70–99)
Potassium: 4.8 mEq/L (ref 3.5–5.1)
Sodium: 139 mEq/L (ref 135–145)

## 2015-01-04 LAB — TSH: TSH: 3.75 u[IU]/mL (ref 0.35–4.50)

## 2015-01-04 LAB — VITAMIN D 25 HYDROXY (VIT D DEFICIENCY, FRACTURES): VITD: 28.06 ng/mL — ABNORMAL LOW (ref 30.00–100.00)

## 2015-01-05 ENCOUNTER — Encounter: Payer: Self-pay | Admitting: *Deleted

## 2015-01-05 ENCOUNTER — Other Ambulatory Visit: Payer: Self-pay | Admitting: Family Medicine

## 2015-01-05 MED ORDER — CHOLECALCIFEROL 25 MCG (1000 UT) PO TABS: 1000.0000 [IU] | ORAL_TABLET | Freq: Every day | ORAL | Status: DC

## 2015-02-03 ENCOUNTER — Other Ambulatory Visit: Payer: Self-pay | Admitting: Family Medicine

## 2015-02-03 NOTE — Telephone Encounter (Signed)
Electronic refill request. Last Filled:    30 tablet 5 RF on 07/08/2014  Please advise.

## 2015-02-04 ENCOUNTER — Ambulatory Visit (INDEPENDENT_AMBULATORY_CARE_PROVIDER_SITE_OTHER): Payer: Medicare Other | Admitting: Family Medicine

## 2015-02-04 ENCOUNTER — Encounter: Payer: Self-pay | Admitting: Family Medicine

## 2015-02-04 VITALS — BP 120/50 | HR 63 | Temp 97.3°F | Wt 125.5 lb

## 2015-02-04 DIAGNOSIS — Z0000 Encounter for general adult medical examination without abnormal findings: Secondary | ICD-10-CM

## 2015-02-04 DIAGNOSIS — I2510 Atherosclerotic heart disease of native coronary artery without angina pectoris: Secondary | ICD-10-CM | POA: Diagnosis not present

## 2015-02-04 DIAGNOSIS — G4700 Insomnia, unspecified: Secondary | ICD-10-CM | POA: Diagnosis not present

## 2015-02-04 DIAGNOSIS — E039 Hypothyroidism, unspecified: Secondary | ICD-10-CM | POA: Diagnosis not present

## 2015-02-04 DIAGNOSIS — Z9861 Coronary angioplasty status: Secondary | ICD-10-CM

## 2015-02-04 NOTE — Telephone Encounter (Signed)
Medication phoned to pharmacy.  

## 2015-02-04 NOTE — Patient Instructions (Addendum)
Check with your insurance to see if they will cover the shingles shot. Think about getting the pneumonia shot.  Call about a mammogram if you don't get a letter from the breast center.  Don't change your meds for now.  Glad to see you.  Take care.

## 2015-02-04 NOTE — Telephone Encounter (Signed)
Please call in.  Thanks.   

## 2015-02-04 NOTE — Progress Notes (Signed)
Pre visit review using our clinic review tool, if applicable. No additional management support is needed unless otherwise documented below in the visit note.  I have personally reviewed the Medicare Annual Wellness questionnaire and have noted 1. The patient's medical and social history 2. Their use of alcohol, tobacco or illicit drugs 3. Their current medications and supplements 4. The patient's functional ability including ADL's, fall risks, home safety risks and hearing or visual             impairment. 5. Diet and physical activities 6. Evidence for depression or mood disorders  The patients weight, height, BMI have been recorded in the chart and visual acuity is per eye clinic.  I have made referrals, counseling and provided education to the patient based review of the above and I have provided the pt with a written personalized care plan for preventive services.  Provider list updated- see scanned forms.  Routine anticipatory guidance given to patient.  See health maintenance.  Flu encouraged Shingles d/w pt  PNA prev done. Tetanus 2012 Colonoscopy not due given age. She agrees.  Breast cancer screening d/w pt.   DXA declined by patient.  Advance directive - daughter Audree Bane if patient were incapacitated.  Cognitive function addressed- see scanned forms- and if abnormal then additional documentation follows.   Hypothyroid.  TSH wnl.  D/w pt.  No ADE on med other than some mild insomnia.  No neck sx, no mass.    CAD.  No CP.  Rare GERD sx, stable.  No SOB, BLE edema.  Compliant with meds.  No ADE on meds.  Labs d/w pt.    PMH and SH reviewed  Meds, vitals, and allergies reviewed.   ROS: See HPI.  Otherwise negative.    GEN: nad, alert and oriented HEENT: mucous membranes moist NECK: supple w/o LA, no tmg CV: rrr. PULM: ctab, no inc wob ABD: soft, +bs EXT: no edema SKIN: no acute rash

## 2015-02-06 ENCOUNTER — Inpatient Hospital Stay (HOSPITAL_COMMUNITY)
Admission: EM | Admit: 2015-02-06 | Discharge: 2015-02-07 | DRG: 313 | Disposition: A | Discharge status: Home / Self Care | Payer: Medicare Other | Attending: Internal Medicine | Admitting: Internal Medicine

## 2015-02-06 ENCOUNTER — Emergency Department (HOSPITAL_COMMUNITY): Payer: Medicare Other

## 2015-02-06 ENCOUNTER — Encounter (HOSPITAL_COMMUNITY): Payer: Self-pay | Admitting: Emergency Medicine

## 2015-02-06 DIAGNOSIS — Z79891 Long term (current) use of opiate analgesic: Secondary | ICD-10-CM

## 2015-02-06 DIAGNOSIS — I2510 Atherosclerotic heart disease of native coronary artery without angina pectoris: Secondary | ICD-10-CM | POA: Diagnosis not present

## 2015-02-06 DIAGNOSIS — Z955 Presence of coronary angioplasty implant and graft: Secondary | ICD-10-CM

## 2015-02-06 DIAGNOSIS — Z8679 Personal history of other diseases of the circulatory system: Secondary | ICD-10-CM

## 2015-02-06 DIAGNOSIS — Z66 Do not resuscitate: Secondary | ICD-10-CM | POA: Diagnosis present

## 2015-02-06 DIAGNOSIS — R0602 Shortness of breath: Secondary | ICD-10-CM | POA: Diagnosis not present

## 2015-02-06 DIAGNOSIS — Z7982 Long term (current) use of aspirin: Secondary | ICD-10-CM | POA: Diagnosis not present

## 2015-02-06 DIAGNOSIS — Z79899 Other long term (current) drug therapy: Secondary | ICD-10-CM | POA: Diagnosis not present

## 2015-02-06 DIAGNOSIS — I10 Essential (primary) hypertension: Secondary | ICD-10-CM | POA: Diagnosis not present

## 2015-02-06 DIAGNOSIS — Z888 Allergy status to other drugs, medicaments and biological substances status: Secondary | ICD-10-CM | POA: Diagnosis not present

## 2015-02-06 DIAGNOSIS — R0789 Other chest pain: Secondary | ICD-10-CM | POA: Diagnosis not present

## 2015-02-06 DIAGNOSIS — I252 Old myocardial infarction: Secondary | ICD-10-CM | POA: Diagnosis not present

## 2015-02-06 DIAGNOSIS — M810 Age-related osteoporosis without current pathological fracture: Secondary | ICD-10-CM | POA: Diagnosis present

## 2015-02-06 DIAGNOSIS — K529 Noninfective gastroenteritis and colitis, unspecified: Secondary | ICD-10-CM | POA: Diagnosis present

## 2015-02-06 DIAGNOSIS — R079 Chest pain, unspecified: Secondary | ICD-10-CM | POA: Diagnosis not present

## 2015-02-06 DIAGNOSIS — Z950 Presence of cardiac pacemaker: Secondary | ICD-10-CM

## 2015-02-06 DIAGNOSIS — R42 Dizziness and giddiness: Secondary | ICD-10-CM | POA: Diagnosis not present

## 2015-02-06 DIAGNOSIS — E785 Hyperlipidemia, unspecified: Secondary | ICD-10-CM | POA: Diagnosis present

## 2015-02-06 DIAGNOSIS — R072 Precordial pain: Secondary | ICD-10-CM | POA: Diagnosis not present

## 2015-02-06 LAB — BASIC METABOLIC PANEL
ANION GAP: 9 (ref 5–15)
BUN: 22 mg/dL — AB (ref 6–20)
CHLORIDE: 104 mmol/L (ref 101–111)
CO2: 25 mmol/L (ref 22–32)
Calcium: 9.7 mg/dL (ref 8.9–10.3)
Creatinine, Ser: 1.33 mg/dL — ABNORMAL HIGH (ref 0.44–1.00)
GFR calc Af Amer: 41 mL/min — ABNORMAL LOW (ref >60)
GFR calc non Af Amer: 35 mL/min — ABNORMAL LOW (ref >60)
Glucose, Bld: 138 mg/dL — ABNORMAL HIGH (ref 65–99)
POTASSIUM: 4 mmol/L (ref 3.5–5.1)
SODIUM: 138 mmol/L (ref 135–145)

## 2015-02-06 LAB — CBC
HCT: 37.2 % (ref 36.0–46.0)
HEMOGLOBIN: 12.7 g/dL (ref 12.0–15.0)
MCH: 29.7 pg (ref 26.0–34.0)
MCHC: 34.1 g/dL (ref 30.0–36.0)
MCV: 86.9 fL (ref 78.0–100.0)
Platelets: 164 10*3/uL (ref 150–400)
RBC: 4.28 MIL/uL (ref 3.87–5.11)
RDW: 13.8 % (ref 11.5–15.5)
WBC: 7.5 10*3/uL (ref 4.0–10.5)

## 2015-02-06 LAB — I-STAT TROPONIN, ED: Troponin i, poc: 0 ng/mL (ref 0.00–0.08)

## 2015-02-06 MED ORDER — ASPIRIN 81 MG PO CHEW
324.0000 mg | CHEWABLE_TABLET | Freq: Once | ORAL | Status: AC
Start: 2015-02-06 — End: 2015-02-06
  Administered 2015-02-06: 324 mg via ORAL
  Filled 2015-02-06: qty 4

## 2015-02-06 NOTE — Assessment & Plan Note (Signed)
TG up but LDL reasonable o/w.  No SX o/w currently. Would continue as is.  D/w pt.  She agrees.

## 2015-02-06 NOTE — ED Provider Notes (Signed)
CSN: [id number]     Arrival date & time 02/06/15  2049 History   First MD Initiated Contact with Patient 02/06/15 2241     Chief Complaint  Patient presents with  . Chest Pain   Patient is a 81 y.o. female presenting with chest pain. The history is provided by the patient.  Chest Pain Pain location:  Substernal area (left axilla) Pain quality: pressure   Radiates to: anterior chest. Pain severity:  Moderate Onset quality:  Gradual Timing:  Constant Progression:  Resolved Chronicity:  New Relieved by:  Nitroglycerin Worsened by:  Nothing tried Associated symptoms: dizziness   Associated symptoms: no fever, no shortness of breath and not vomiting   Patient presents with left axilla pain as well as anterior chest pain for past day She also reports dizziness She had pain on/off yesterday and constant today Pain resolved with NTG She is now CP free at this time  Past Medical History  Diagnosis Date  . Arthritis   . Allergy   . Hyperlipidemia   . Hypertension   . CAD (coronary artery disease)   . Myocardial infarction   . Heart murmur   . Insomnia   . Fatty liver     on u/s 2010  . Osteoporosis     dxa 2013  . SSS (sick sinus syndrome)     St. Jude Permanent Pacemaker 09/23/06  . PAF (paroxysmal atrial fibrillation)   . Symptomatic bradycardia    Past Surgical History  Procedure Laterality Date  . Pacemaker insertion  09/23/06    St.Jude  . Cholecystectomy    . Back surgery    . Carpal tunnel release      left  . Foot surgery      Left  . Hernia repair      R inguinal  . Coronary angioplasty with stent placement  12/03/06    Stent to distal RCA  . Coronary angioplasty with stent placement  09/12/06    Stenting mid distal segment & PTCA alone distal RCA prox. to the posterior descending artery takeoff.  . US echocardiography  11/26/11    prox. septal thickening, mild concentric LVH, mod. ca+ AOV,trace AI,mild to mod valvular aortic stenosis.  Marland Kitchen Ep implantable device  N/A 10/26/2014    Procedure: PPM Generator Changeout;  Surgeon: Sanda Klein, MD;  Location: [facility];  Service: Cardiovascular;  Laterality: N/A;  . Ep implantable device N/A 10/26/2014    Procedure: Pacemaker Revision- new RV Lead;  Surgeon: Sanda Klein, MD;  Location: [facility];  Service: Cardiovascular;  Laterality: N/A;   Family History  Problem Relation Age of Onset  . Early death Mother     died at age 27, unknown cause  . Heart disease Father   . Diabetes Father    Social History  Substance Use Topics  . Smoking status: Never Smoker   . Smokeless tobacco: Never Used  . Alcohol Use: No   OB History    No data available     Review of Systems  Constitutional: Negative for fever.  Respiratory: Negative for shortness of breath.   Cardiovascular: Positive for chest pain.  Gastrointestinal: Negative for vomiting.  Neurological: Positive for dizziness.  All other systems reviewed and are negative.     Allergies  Fosamax  Home Medications   Prior to Admission medications   Medication Sig Start Date End Date Taking? Authorizing Provider  ALPRAZolam (XANAX) 0.25 MG tablet TAKE ONE TABLET BY MOUTH AT  BEDTIME AS NEEDED Patient taking differently: TAKE ONE TABLET BY MOUTH  DAILY AS NEEDED FOR ANXIETY 02/04/15  Yes Tonia Ghent, MD  aspirin 81 MG tablet Take 81 mg by mouth every other day.    Yes Historical Provider, MD  BIOTIN PO Take 1 tablet by mouth daily.   Yes Historical Provider, MD  calcium gluconate 500 MG tablet Take 500 mg by mouth daily.    Yes Historical Provider, MD  cetirizine (ZYRTEC) 10 MG tablet Take 10 mg by mouth daily as needed for allergies.   Yes Historical Provider, MD  Cholecalciferol (EQL VITAMIN D3) 1000 UNITS tablet Take 1 tablet (1,000 Units total) by mouth daily. 01/05/15  Yes Tonia Ghent, MD  levothyroxine (LEVOTHROID) 25 MCG tablet Take 1 tablet (25 mcg total) by mouth daily before breakfast. 11/02/14  Yes Tonia Ghent, MD  loperamide (IMODIUM) 1 MG/5ML solution Take 4 mg by mouth as needed for diarrhea or loose stools.    Yes Historical Provider, MD  losartan-hydrochlorothiazide (HYZAAR) 50-12.5 MG per tablet Take 1 tablet by mouth daily. 11/07/14  Yes Tonia Ghent, MD  metoprolol tartrate (LOPRESSOR) 25 MG tablet Take 1 tablet (25 mg total) by mouth 2 (two) times daily. 11/07/14  Yes Tonia Ghent, MD  nitroGLYCERIN (NITROSTAT) 0.4 MG SL tablet Place 1 tablet (0.4 mg total) under the tongue every 5 (five) minutes as needed. 11/07/14  Yes Tonia Ghent, MD  omeprazole (PRILOSEC) 40 MG capsule Take 1 capsule (40 mg total) by mouth daily as needed. Patient taking differently: Take 40 mg by mouth daily as needed (FOR HEARTBURN).  11/07/14  Yes Tonia Ghent, MD  potassium chloride (K-DUR) 10 MEQ tablet Take 10 mEq by mouth daily.   Yes Historical Provider, MD  simvastatin (ZOCOR) 20 MG tablet Take 1 tablet (20 mg total) by mouth at bedtime. 11/07/14  Yes Tonia Ghent, MD  traMADol (ULTRAM) 50 MG tablet TAKE ONE TABLET BY MOUTH 3 TIMES DAILY Patient taking differently: TAKE ONE TABLET BY MOUTH 3 TIMES DAILY AS NEEDED 10/07/14  Yes Tonia Ghent, MD  acetaminophen (TYLENOL) 325 MG tablet Take 1-2 tablets (325-650 mg total) by mouth every 4 (four) hours as needed for mild pain. 10/27/14   Luke K Kilroy, PA-C   BP 130/56 mmHg  Pulse 70  Temp(Src) 97.6 F (36.4 C) (Oral)  Resp 21  SpO2 97% Physical Exam CONSTITUTIONAL: Well developed/well nourished HEAD: Normocephalic/atraumatic EYES: EOMI ENMT: Mucous membranes moist NECK: supple no meningeal signs SPINE/BACK:entire spine nontender CV: S1/S2 noted, no murmurs/rubs/gallops noted LUNGS: Lungs are clear to auscultation bilaterally, no apparent distress ABDOMEN: soft, nontender, no rebound or guarding, bowel sounds noted throughout abdomen NEURO: Pt is awake/alert/appropriate, moves all extremitiesx4.  EXTREMITIES: pulses normal/equal, full ROM.   No tenderness/bruising/mass/erythema to left axilla SKIN: warm, color normal PSYCH: no abnormalities of mood noted, alert and oriented to situation  ED Course  Procedures  11:12 PM Pt is CP free at this time Initial workup negative However given strong h/o cardiac disease with stent placement, would benefit from monitoring for ACS rule out I have low suspicion for PE/Dissection at this time Pt agreeable D/w dr Tyrell Antonio for admission  Labs Review Labs Reviewed  BASIC METABOLIC PANEL - Abnormal; Notable for the following:    Glucose, Bld 138 (*)    BUN 22 (*)    Creatinine, Ser 1.33 (*)    GFR calc non Af Amer 35 (*)    GFR calc  Af Amer 41 (*)    All other components within normal limits  CBC  I-STAT TROPOININ, ED    Imaging Review Dg Chest 2 View  02/06/2015   CLINICAL DATA:  Left-sided chest pain with shortness of Breath  EXAM: CHEST - 2 VIEW  COMPARISON:  10/27/2014  FINDINGS: Cardiac shadow is stable. A pacing device is again seen. The lungs are clear bilaterally. No focal infiltrate or sizable effusion is noted. Degenerative changes of the thoracic spine are seen.  IMPRESSION: No active disease.   Electronically Signed   By: Inez Catalina M.D.   On: 02/06/2015 21:52   I, Sharyon Cable, personally reviewed and evaluated these images and lab results as part of my medical decision-making.   EKG Interpretation   Date/Time:  Sunday February 06 2015 20:55:57 EDT Ventricular Rate:  61 PR Interval:  124 QRS Duration: 88 QT Interval:  434 QTC Calculation: 436 R Axis:   51 Text Interpretation:  suspect atrial paced ECG OTHERWISE WITHIN NORMAL  LIMITS Normal ECG Confirmed by Christy Gentles  MD, Elenore Rota ([id number]) on 02/06/2015  10:42:42 PM      MDM   Final diagnoses:  Chest pain, rule out acute myocardial infarction    Nursing notes including past medical history and social history reviewed and considered in documentation xrays/imaging reviewed by myself and considered during  evaluation Labs/vital reviewed myself and considered during evaluation Previous records reviewed and considered     Ripley Fraise, MD 02/06/15 2314

## 2015-02-06 NOTE — Assessment & Plan Note (Signed)
Flu encouraged  Shingles d/w pt  PNA prev done.  Tetanus 2012  Colonoscopy not due given age. She agrees.  Breast cancer screening d/w pt.  DXA declined by patient.  Advance directive - daughter Audree Bane if patient were incapacitated.  Cognitive function addressed- see scanned forms- and if abnormal then additional documentation follows.

## 2015-02-06 NOTE — ED Notes (Addendum)
C/o intermittent episodes of pain to L axilla and pain to L chest/under L breast since noon today.  Denies pain at this time.  Also reports sob.  Denies nausea and vomiting.  Significant Cardiac history.

## 2015-02-06 NOTE — Assessment & Plan Note (Signed)
TSH wnl, continue as is.  D/w pt.  She agrees.

## 2015-02-06 NOTE — Assessment & Plan Note (Signed)
Rare BZD use.  No ADE on med.

## 2015-02-06 NOTE — H&P (Signed)
[facility] History and Physical  [name] IPJ:[id number] DOB: February 17, 1928 DOA: 02/06/2015  Referring physician: Dr Christy Gentles.  PCP: Elsie Stain, MD   Chief Complaint: Chest pain.   HPI: [name] is a 81 y.o. female with PMH significant for CAD, HLD, HTN, PAF, SSS post pacemaker 2008, presents complaining of chest pain that started couple days ago, but today pain persisted. She report left axillary pain, radiating to anterior chest. Similar to prior MI.  Pain resolved with nitroglycerin. She report some dyspnea at times. She is chest pain free. Denies abdominal pain, nausea, vomiting. Does report chronic diarrhea. Denies chest pain on exertion.    Review of Systems:  Negative, except as per HPI  Past Medical History  Diagnosis Date  . Arthritis   . Allergy   . Hyperlipidemia   . Hypertension   . CAD (coronary artery disease)   . Myocardial infarction   . Heart murmur   . Insomnia   . Fatty liver     on u/s 2010  . Osteoporosis     dxa 2013  . SSS (sick sinus syndrome)     St. Jude Permanent Pacemaker 09/23/06  . PAF (paroxysmal atrial fibrillation)   . Symptomatic bradycardia    Past Surgical History  Procedure Laterality Date  . Pacemaker insertion  09/23/06    St.Jude  . Cholecystectomy    . Back surgery    . Carpal tunnel release      left  . Foot surgery      Left  . Hernia repair      R inguinal  . Coronary angioplasty with stent placement  12/03/06    Stent to distal RCA  . Coronary angioplasty with stent placement  09/12/06    Stenting mid distal segment & PTCA alone distal RCA prox. to the posterior descending artery takeoff.  . US echocardiography  11/26/11    prox. septal thickening, mild concentric LVH, mod. ca+ AOV,trace AI,mild to mod valvular aortic stenosis.  Marland Kitchen Ep implantable device N/A 10/26/2014    Procedure: PPM Generator Changeout;  Surgeon: Sanda Klein, MD;  Location: [facility];  Service: Cardiovascular;  Laterality: N/A;    . Ep implantable device N/A 10/26/2014    Procedure: Pacemaker Revision- new RV Lead;  Surgeon: Sanda Klein, MD;  Location: [facility];  Service: Cardiovascular;  Laterality: N/A;   Social History:  reports that she has never smoked. She has never used smokeless tobacco. She reports that she does not drink alcohol or use illicit drugs.  Allergies  Allergen Reactions  . Fosamax [Alendronate Sodium] Other (See Comments)    Anxious and "I just didn't feel right" after each dose; 2.5 month trial    Family History  Problem Relation Age of Onset  . Early death Mother     died at age 22, unknown cause  . Heart disease Father   . Diabetes Father     Prior to Admission medications   Medication Sig Start Date End Date Taking? Authorizing Provider  ALPRAZolam (XANAX) 0.25 MG tablet TAKE ONE TABLET BY MOUTH AT BEDTIME AS NEEDED Patient taking differently: TAKE ONE TABLET BY MOUTH  DAILY AS NEEDED FOR ANXIETY 02/04/15  Yes Tonia Ghent, MD  aspirin 81 MG tablet Take 81 mg by mouth every other day.    Yes Historical Provider, MD  BIOTIN PO Take 1 tablet by mouth daily.   Yes Historical Provider, MD  calcium gluconate 500 MG tablet Take  500 mg by mouth daily.    Yes Historical Provider, MD  cetirizine (ZYRTEC) 10 MG tablet Take 10 mg by mouth daily as needed for allergies.   Yes Historical Provider, MD  Cholecalciferol (EQL VITAMIN D3) 1000 UNITS tablet Take 1 tablet (1,000 Units total) by mouth daily. 01/05/15  Yes Tonia Ghent, MD  levothyroxine (LEVOTHROID) 25 MCG tablet Take 1 tablet (25 mcg total) by mouth daily before breakfast. 11/02/14  Yes Tonia Ghent, MD  loperamide (IMODIUM) 1 MG/5ML solution Take 4 mg by mouth as needed for diarrhea or loose stools.    Yes Historical Provider, MD  losartan-hydrochlorothiazide (HYZAAR) 50-12.5 MG per tablet Take 1 tablet by mouth daily. 11/07/14  Yes Tonia Ghent, MD  metoprolol tartrate (LOPRESSOR) 25 MG tablet Take 1 tablet (25 mg  total) by mouth 2 (two) times daily. 11/07/14  Yes Tonia Ghent, MD  nitroGLYCERIN (NITROSTAT) 0.4 MG SL tablet Place 1 tablet (0.4 mg total) under the tongue every 5 (five) minutes as needed. 11/07/14  Yes Tonia Ghent, MD  omeprazole (PRILOSEC) 40 MG capsule Take 1 capsule (40 mg total) by mouth daily as needed. Patient taking differently: Take 40 mg by mouth daily as needed (FOR HEARTBURN).  11/07/14  Yes Tonia Ghent, MD  potassium chloride (K-DUR) 10 MEQ tablet Take 10 mEq by mouth daily.   Yes Historical Provider, MD  simvastatin (ZOCOR) 20 MG tablet Take 1 tablet (20 mg total) by mouth at bedtime. 11/07/14  Yes Tonia Ghent, MD  traMADol (ULTRAM) 50 MG tablet TAKE ONE TABLET BY MOUTH 3 TIMES DAILY Patient taking differently: TAKE ONE TABLET BY MOUTH 3 TIMES DAILY AS NEEDED 10/07/14  Yes Tonia Ghent, MD  acetaminophen (TYLENOL) 325 MG tablet Take 1-2 tablets (325-650 mg total) by mouth every 4 (four) hours as needed for mild pain. 10/27/14   Erlene Quan, PA-C   Physical Exam: Filed Vitals:   02/06/15 2215 02/06/15 2230 02/06/15 2245 02/06/15 2300  BP: 116/57 126/53 136/56 130/56  Pulse: 60 65 66 70  Temp:      TempSrc:      Resp: 16 15 16 21   SpO2: 99% 96% 98% 97%    Wt Readings from Last 3 Encounters:  02/04/15 56.926 kg (125 lb 8 oz)  12/13/14 56.881 kg (125 lb 6.4 oz)  11/17/14 56.246 kg (124 lb)    General:  Appears calm and comfortable Eyes: PERRL, normal lids, irises & conjunctiva ENT: grossly normal hearing, lips & tongue Neck: no LAD, masses or thyromegaly Cardiovascular: RRR, no m/r/g. No LE edema. Telemetry: SR, no arrhythmias  Respiratory: CTA bilaterally, no w/r/r. Normal respiratory effort. Abdomen: soft, ntnd Skin: no rash or induration seen on limited exam Musculoskeletal: grossly normal tone BUE/BLE Psychiatric: grossly normal mood and affect, speech fluent and appropriate Neurologic: grossly non-focal.          Labs on Admission:  Basic  Metabolic Panel:  Recent Labs Lab 02/06/15 2051  NA 138  K 4.0  CL 104  CO2 25  GLUCOSE 138*  BUN 22*  CREATININE 1.33*  CALCIUM 9.7   Liver Function Tests: No results for input(s): AST, ALT, ALKPHOS, BILITOT, PROT, ALBUMIN in the last 168 hours. No results for input(s): LIPASE, AMYLASE in the last 168 hours. No results for input(s): AMMONIA in the last 168 hours. CBC:  Recent Labs Lab 02/06/15 2051  WBC 7.5  HGB 12.7  HCT 37.2  MCV 86.9  PLT 164  Cardiac Enzymes: No results for input(s): CKTOTAL, CKMB, CKMBINDEX, TROPONINI in the last 168 hours.  BNP (last 3 results) No results for input(s): BNP in the last 8760 hours.  ProBNP (last 3 results) No results for input(s): PROBNP in the last 8760 hours.  CBG: No results for input(s): GLUCAP in the last 168 hours.  Radiological Exams on Admission: Dg Chest 2 View  02/06/2015   CLINICAL DATA:  Left-sided chest pain with shortness of Breath  EXAM: CHEST - 2 VIEW  COMPARISON:  10/27/2014  FINDINGS: Cardiac shadow is stable. A pacing device is again seen. The lungs are clear bilaterally. No focal infiltrate or sizable effusion is noted. Degenerative changes of the thoracic spine are seen.  IMPRESSION: No active disease.   Electronically Signed   By: Inez Catalina M.D.   On: 02/06/2015 21:52    EKG: Independently reviewed. Sinus, no ST elevation.   Assessment/Plan Active Problems:   Chest pain, rule out acute myocardial infarction  1-Chest Pian;  Multiple risk factors. Admit to telemetry, chest pain unit.  Cycle cardiac enzymes. ECHO.  Continue with aspirin, metoprolol. Continue with statins.  Per daughter patient had a normal Stress test in June.   2-HTN; continue with home medications.   3-Bradycardia, Post pacemaker.    Code Status: DNR DVT Prophylaxis:lovenox.  Family Communication: care discussed with patient.  Disposition Plan: expect less than 2 nights/   Time spent: 75 minutes.   Niel Hummer  A [facility] Pager [phone]

## 2015-02-07 ENCOUNTER — Encounter (HOSPITAL_COMMUNITY): Payer: Self-pay | Admitting: *Deleted

## 2015-02-07 ENCOUNTER — Inpatient Hospital Stay (HOSPITAL_COMMUNITY): Payer: Medicare Other

## 2015-02-07 DIAGNOSIS — I2510 Atherosclerotic heart disease of native coronary artery without angina pectoris: Secondary | ICD-10-CM

## 2015-02-07 DIAGNOSIS — R079 Chest pain, unspecified: Secondary | ICD-10-CM

## 2015-02-07 DIAGNOSIS — I252 Old myocardial infarction: Secondary | ICD-10-CM

## 2015-02-07 DIAGNOSIS — R0789 Other chest pain: Principal | ICD-10-CM

## 2015-02-07 DIAGNOSIS — I10 Essential (primary) hypertension: Secondary | ICD-10-CM

## 2015-02-07 DIAGNOSIS — Z950 Presence of cardiac pacemaker: Secondary | ICD-10-CM

## 2015-02-07 LAB — TROPONIN I

## 2015-02-07 MED ORDER — PANTOPRAZOLE SODIUM 40 MG PO TBEC
40.0000 mg | DELAYED_RELEASE_TABLET | Freq: Every day | ORAL | Status: DC
  Administered 2015-02-07: 40 mg via ORAL
  Filled 2015-02-07: qty 1

## 2015-02-07 MED ORDER — SODIUM CHLORIDE 0.9 % IJ SOLN: 3.0000 mL | Freq: Two times a day (BID) | INTRAMUSCULAR | Status: DC

## 2015-02-07 MED ORDER — LOSARTAN POTASSIUM 50 MG PO TABS
50.0000 mg | ORAL_TABLET | Freq: Every day | ORAL | Status: DC
  Administered 2015-02-07: 50 mg via ORAL
  Filled 2015-02-07: qty 1

## 2015-02-07 MED ORDER — SODIUM CHLORIDE 0.9 % IJ SOLN: 3.0000 mL | INTRAMUSCULAR | Status: DC | PRN

## 2015-02-07 MED ORDER — CALCIUM GLUCONATE 500 MG PO TABS
500.0000 mg | ORAL_TABLET | Freq: Every day | ORAL | Status: DC
  Filled 2015-02-07: qty 1

## 2015-02-07 MED ORDER — ASPIRIN EC 81 MG PO TBEC
81.0000 mg | DELAYED_RELEASE_TABLET | Freq: Every day | ORAL | Status: DC
  Administered 2015-02-07: 81 mg via ORAL
  Filled 2015-02-07: qty 1

## 2015-02-07 MED ORDER — METOPROLOL TARTRATE 25 MG PO TABS
25.0000 mg | ORAL_TABLET | Freq: Two times a day (BID) | ORAL | Status: DC
  Administered 2015-02-07 (×2): 25 mg via ORAL
  Filled 2015-02-07 (×3): qty 1

## 2015-02-07 MED ORDER — TRAMADOL HCL 50 MG PO TABS: 50.0000 mg | ORAL_TABLET | Freq: Four times a day (QID) | ORAL | Status: DC | PRN

## 2015-02-07 MED ORDER — HYDROCHLOROTHIAZIDE 12.5 MG PO CAPS
12.5000 mg | ORAL_CAPSULE | Freq: Every day | ORAL | Status: DC
  Administered 2015-02-07: 12.5 mg via ORAL
  Filled 2015-02-07: qty 1

## 2015-02-07 MED ORDER — SODIUM CHLORIDE 0.9 % IV SOLN: 250.0000 mL | INTRAVENOUS | Status: DC | PRN

## 2015-02-07 MED ORDER — HEPARIN SODIUM (PORCINE) 5000 UNIT/ML IJ SOLN
5000.0000 [IU] | Freq: Three times a day (TID) | INTRAMUSCULAR | Status: DC
  Administered 2015-02-07: 5000 [IU] via SUBCUTANEOUS
  Filled 2015-02-07 (×3): qty 1

## 2015-02-07 MED ORDER — LOPERAMIDE HCL 1 MG/5ML PO LIQD
4.0000 mg | ORAL | Status: DC | PRN
  Filled 2015-02-07: qty 20

## 2015-02-07 MED ORDER — ACETAMINOPHEN 650 MG RE SUPP: 650.0000 mg | Freq: Four times a day (QID) | RECTAL | Status: DC | PRN

## 2015-02-07 MED ORDER — NITROGLYCERIN 0.4 MG SL SUBL: 0.4000 mg | SUBLINGUAL_TABLET | SUBLINGUAL | Status: DC | PRN

## 2015-02-07 MED ORDER — SIMVASTATIN 20 MG PO TABS
20.0000 mg | ORAL_TABLET | Freq: Every day | ORAL | Status: DC
  Administered 2015-02-07: 20 mg via ORAL
  Filled 2015-02-07 (×2): qty 1

## 2015-02-07 MED ORDER — ACETAMINOPHEN 325 MG PO TABS: 650.0000 mg | ORAL_TABLET | Freq: Four times a day (QID) | ORAL | Status: DC | PRN

## 2015-02-07 MED ORDER — LOSARTAN POTASSIUM-HCTZ 50-12.5 MG PO TABS: 1.0000 | ORAL_TABLET | Freq: Every day | ORAL | Status: DC

## 2015-02-07 MED ORDER — ALPRAZOLAM 0.25 MG PO TABS: 0.2500 mg | ORAL_TABLET | Freq: Every evening | ORAL | Status: DC | PRN

## 2015-02-07 MED ORDER — LEVOTHYROXINE SODIUM 25 MCG PO TABS
25.0000 ug | ORAL_TABLET | Freq: Every day | ORAL | Status: DC
  Administered 2015-02-07: 25 ug via ORAL
  Filled 2015-02-07 (×2): qty 1

## 2015-02-07 NOTE — Consult Note (Signed)
Admit date: 02/06/2015 Referring Physician  Dr. Broadus John Primary Physician Elsie Stain, MD Primary Cardiologist  Dr. Sallyanne Kuster Reason for Consultation  Chest pain  HPI: 81 year old female with nuclear stress test showing normal perfusion and normal left ventricle systolic function on 07/27/52 here with chest pain. Her chest pain started a few days ago but persisted and worried her. She reported left axillary pain radiating to her anterior chest that felt similar to her prior myocardial infarction in 2008. Chest pain was nonexertional. It seemed to resolve with nitroglycerin. On admission to the emergency room, she was chest pain-free. No diaphoresis, no nausea however she does report chronic loose stools.  She also has pacemaker as well as has a history of coronary artery disease with drug-eluting stent placed in the right coronary artery in the setting of inferior wall myocardial infarction in 2008 and also had repeat stenting of the distal right coronary artery later in that same year. 2008 was the year her St. Jude pacemaker, dual-chamber was placed. Remote history of paroxysmal atrial fibrillation was noted but none has been documented by pacemaker follow-up. No anticoagulation.  She continues to take aspirin as well as metoprolol. She is also on simvastatin 20 mg.  Feels well now. She does state that she had to push her lawnmower the other day when it broke down. ?MSK strain.   Feels better from a breathing perspective after new pacer placed.     PMH:   Past Medical History  Diagnosis Date  . Arthritis   . Allergy   . Hyperlipidemia   . Hypertension   . CAD (coronary artery disease)   . Myocardial infarction   . Heart murmur   . Insomnia   . Fatty liver     on u/s 2010  . Osteoporosis     dxa 2013  . SSS (sick sinus syndrome)     St. Jude Permanent Pacemaker 09/23/06  . PAF (paroxysmal atrial fibrillation)   . Symptomatic bradycardia     PSH:   Past Surgical History    Procedure Laterality Date  . Pacemaker insertion  09/23/06    St.Jude  . Cholecystectomy    . Back surgery    . Carpal tunnel release      left  . Foot surgery      Left  . Hernia repair      R inguinal  . Coronary angioplasty with stent placement  12/03/06    Stent to distal RCA  . Coronary angioplasty with stent placement  09/12/06    Stenting mid distal segment & PTCA alone distal RCA prox. to the posterior descending artery takeoff.  . US echocardiography  11/26/11    prox. septal thickening, mild concentric LVH, mod. ca+ AOV,trace AI,mild to mod valvular aortic stenosis.  Marland Kitchen Ep implantable device N/A 10/26/2014    Procedure: PPM Generator Changeout;  Surgeon: Sanda Klein, MD;  Location: [place] [facility];  Service: Cardiovascular;  Laterality: N/A;  . Ep implantable device N/A 10/26/2014    Procedure: Pacemaker Revision- new RV Lead;  Surgeon: Sanda Klein, MD;  Location: [place] [facility];  Service: Cardiovascular;  Laterality: N/A;  . Coronary stent placement      RCA stent replaced    Allergies:  Fosamax Prior to Admit Meds:   Prior to Admission medications   Medication Sig Start Date End Date Taking? Authorizing Provider  ALPRAZolam (XANAX) 0.25 MG tablet TAKE ONE TABLET BY MOUTH AT BEDTIME AS NEEDED Patient taking differently: TAKE  ONE TABLET BY MOUTH  DAILY AS NEEDED FOR ANXIETY 02/04/15  Yes Tonia Ghent, MD  aspirin 81 MG tablet Take 81 mg by mouth every other day.    Yes Historical Provider, MD  BIOTIN PO Take 1 tablet by mouth daily.   Yes Historical Provider, MD  calcium gluconate 500 MG tablet Take 500 mg by mouth daily.    Yes Historical Provider, MD  cetirizine (ZYRTEC) 10 MG tablet Take 10 mg by mouth daily as needed for allergies.   Yes Historical Provider, MD  Cholecalciferol (EQL VITAMIN D3) 1000 UNITS tablet Take 1 tablet (1,000 Units total) by mouth daily. 01/05/15  Yes Tonia Ghent, MD  levothyroxine (LEVOTHROID) 25 MCG tablet Take 1 tablet (25 mcg  total) by mouth daily before breakfast. 11/02/14  Yes Tonia Ghent, MD  loperamide (IMODIUM) 1 MG/5ML solution Take 4 mg by mouth as needed for diarrhea or loose stools.    Yes Historical Provider, MD  losartan-hydrochlorothiazide (HYZAAR) 50-12.5 MG per tablet Take 1 tablet by mouth daily. 11/07/14  Yes Tonia Ghent, MD  metoprolol tartrate (LOPRESSOR) 25 MG tablet Take 1 tablet (25 mg total) by mouth 2 (two) times daily. 11/07/14  Yes Tonia Ghent, MD  nitroGLYCERIN (NITROSTAT) 0.4 MG SL tablet Place 1 tablet (0.4 mg total) under the tongue every 5 (five) minutes as needed. 11/07/14  Yes Tonia Ghent, MD  omeprazole (PRILOSEC) 40 MG capsule Take 1 capsule (40 mg total) by mouth daily as needed. Patient taking differently: Take 40 mg by mouth daily as needed (FOR HEARTBURN).  11/07/14  Yes Tonia Ghent, MD  potassium chloride (K-DUR) 10 MEQ tablet Take 10 mEq by mouth daily.   Yes Historical Provider, MD  simvastatin (ZOCOR) 20 MG tablet Take 1 tablet (20 mg total) by mouth at bedtime. 11/07/14  Yes Tonia Ghent, MD  traMADol (ULTRAM) 50 MG tablet TAKE ONE TABLET BY MOUTH 3 TIMES DAILY Patient taking differently: TAKE ONE TABLET BY MOUTH 3 TIMES DAILY AS NEEDED 10/07/14  Yes Tonia Ghent, MD  acetaminophen (TYLENOL) 325 MG tablet Take 1-2 tablets (325-650 mg total) by mouth every 4 (four) hours as needed for mild pain. 10/27/14   Erlene Quan, PA-C   Fam HX:    Family History  Problem Relation Age of Onset  . Early death Mother     died at age 12, unknown cause  . Heart disease Father   . Diabetes Father    Social HX:    Social History   Social History  . Marital Status: Widowed    Spouse Name: N/A  . Number of Children: N/A  . Years of Education: N/A   Occupational History  . Not on file.   Social History Main Topics  . Smoking status: Never Smoker   . Smokeless tobacco: Never Used  . Alcohol Use: No  . Drug Use: No  . Sexual Activity: No   Other Topics Concern    . Not on file   Social History Narrative   Widowed in 1992, lives alone   2 daughters, both local   Retired from [employer] work   Enjoys church, yardwork     ROS:  All 11 ROS were addressed and are negative except what is stated in the HPI   Physical Exam: Blood pressure 121/53, pulse 60, temperature 97.8 F (36.6 C), temperature source Oral, resp. rate 18, height 5' (1.524 m), weight 124 lb 12.5 oz (56.6 kg), SpO2  98 %.   General: Elderly, in no acute distress Head: Eyes PERRLA, No xanthomas.   Normal cephalic and atramatic  Lungs:   Clear bilaterally to auscultation and percussion. Normal respiratory effort. No wheezes, no rales. Heart:   HRRR S1 S2 Pulses are 2+ & equal. No murmur, rubs, gallops.  No carotid bruit. No JVD.  No abdominal bruits. Pacemaker Abdomen: Bowel sounds are positive, abdomen soft and non-tender without masses. No hepatosplenomegaly. Msk:  Back normal. Normal strength and tone for age. No rash Extremities:  No clubbing, cyanosis or edema.  DP +1 Neuro: Alert and oriented X 3, non-focal, MAE x 4 GU: Deferred Rectal: Deferred Psych:  Good affect, responds appropriately      Labs: Lab Results  Component Value Date   WBC 7.5 02/06/2015   HGB 12.7 02/06/2015   HCT 37.2 02/06/2015   MCV 86.9 02/06/2015   PLT 164 02/06/2015     Recent Labs Lab 02/06/15 2051  NA 138  K 4.0  CL 104  CO2 25  BUN 22*  CREATININE 1.33*  CALCIUM 9.7  GLUCOSE 138*    Recent Labs  02/07/15 0455  TROPONINI <0.03   Lab Results  Component Value Date   CHOL 148 01/04/2015   HDL 40.60 01/04/2015   LDLCALC 71 12/11/2013   TRIG 229.0* 01/04/2015   Lab Results  Component Value Date   DDIMER 1.29* 05/13/2012     Radiology:  Dg Chest 2 View  02/06/2015   CLINICAL DATA:  Left-sided chest pain with shortness of Breath  EXAM: CHEST - 2 VIEW  COMPARISON:  10/27/2014  FINDINGS: Cardiac shadow is stable. A pacing device is again seen. The lungs are clear bilaterally.  No focal infiltrate or sizable effusion is noted. Degenerative changes of the thoracic spine are seen.  IMPRESSION: No active disease.   Electronically Signed   By: Inez Catalina M.D.   On: 02/06/2015 21:52   Personally viewed.  EKG:  02/06/15-Sinus rhythm, no ST segment changes, periodic atrial pacing noted Personally viewed.   ASSESSMENT/PLAN:    81 year old female here with atypical chest pain, nuclear stress test normal in May 2016, pacemaker, essential hypertension, with prior history of myocardial infarction in 2008 with RCA stenting.  1. Atypical chest pain  - Troponin is reassuring, normal  - EKG is reassuring with no ST segment changes suggestive of ischemia  - Recent nuclear stress test is also reassuring showing no signs of ischemia approximately 3 months ago.  - Interestingly, back in May prior to last stress test her troponin was minimally elevated at 0.1. Currently normal.  - Given recency of stress test, reassurance. Other chest pain etiologies include musculoskeletal, GI for instance.  2. Essential hypertension  - Medications reviewed, currently controlled  3. Hyperlipidemia  - LDL 79. Optimal goal less than 70. Could consider transition from simvastatin 20 mg over to atorvastatin as outpatient.  4. Pacemaker  - Atrial pacing noted occasionally on EKG. Functioning properly.  5. CAD  - History of MI 2008, RCA stenting, DES  Comfortable with DC home. Discussed with Dr. Broadus John.   Candee Furbish, MD  02/07/2015  8:41 AM

## 2015-02-07 NOTE — Progress Notes (Signed)
Pt admitted to 2w09. Daughter at bedside. Pt alert oriented x4, but forgetful at times per daughter. Monitor applied. Call bell within reach.   [name]

## 2015-02-07 NOTE — Progress Notes (Signed)
Utilization review completed.  

## 2015-02-07 NOTE — Progress Notes (Signed)
Daughter would like to discuss specifics of code status. She knows her mother does not want to be intubated, but she would like to discuss the details of DNR.   Thanks!  Earlie Lou

## 2015-02-07 NOTE — Discharge Summary (Signed)
Physician Discharge Summary  [name] JQD:[id number] DOB: [birth year] DOA: 02/06/2015  PCP: Elsie Stain, MD  Admit date: 02/06/2015 Discharge date: 02/07/2015  Time spent: 45 minutes  Recommendations for Outpatient Follow-up:  1. PCP in 1 week  Discharge Diagnoses:  Active Problems:   Atypical chest pain   CAD   P.Afib   SSS post pacemaker   Dyslipidemia   Hypertension  Discharge Condition: stable  Diet recommendation: heart ehalthy  Filed Weights   02/07/15 0017  Weight: 56.6 kg (124 lb 12.5 oz)    History of present illness:  Chief Complaint: Chest pain.  HPI: [name] is a 81 y.o. female with PMH significant for CAD, HLD, HTN, PAF, SSS post pacemaker 2008, presents complaining of chest pain that started couple days ago, but on 8/14 pain persisted. She report left axillary pain, radiating to anterior chest. Similar to prior MI.  Pain resolved with nitroglycerin. She report some dyspnea at times  Hospital Course:   Atypical chest pain - EKG and cardiac enzymes normal - Recent nuclear stress test is also reassuring showing no signs of ischemia approximately 3 months ago.  2. Essential hypertension - Medications reviewed, currently controlled  3. Hyperlipidemia - LDL 79. Continue statin  4. Pacemaker - Atrial pacing noted occasionally on EKG. Functioning properly.  5. CAD - History of MI 2008, RCA stenting, DES -stable, continue ASA/Lopressor/statin  Consultations:  Cardiology  Discharge Exam: Filed Vitals:   02/07/15 0549  BP: 121/53  Pulse: 60  Temp: 97.8 F (36.6 C)  Resp: 18    General: AAOx3 Cardiovascular: S1S2/RRR Respiratory: CTAB  Discharge Instructions   Discharge Instructions    Diet - low sodium heart healthy    Complete by:  As directed      Increase activity slowly    Complete by:  As directed           Discharge Medication List as of 02/07/2015 10:53 AM    CONTINUE these medications which have NOT CHANGED    Details  ALPRAZolam (XANAX) 0.25 MG tablet TAKE ONE TABLET BY MOUTH AT BEDTIME AS NEEDED, Phone In    aspirin 81 MG tablet Take 81 mg by mouth every other day. , Until Discontinued, Historical Med    BIOTIN PO Take 1 tablet by mouth daily., Until Discontinued, Historical Med    calcium gluconate 500 MG tablet Take 500 mg by mouth daily. , Until Discontinued, Historical Med    cetirizine (ZYRTEC) 10 MG tablet Take 10 mg by mouth daily as needed for allergies., Until Discontinued, Historical Med    Cholecalciferol (EQL VITAMIN D3) 1000 UNITS tablet Take 1 tablet (1,000 Units total) by mouth daily., Starting 01/05/2015, Until Discontinued, OTC    levothyroxine (LEVOTHROID) 25 MCG tablet Take 1 tablet (25 mcg total) by mouth daily before breakfast., Starting 11/02/2014, Until Discontinued, Normal    loperamide (IMODIUM) 1 MG/5ML solution Take 4 mg by mouth as needed for diarrhea or loose stools. , Until Discontinued, Historical Med    losartan-hydrochlorothiazide (HYZAAR) 50-12.5 MG per tablet Take 1 tablet by mouth daily., Starting 11/07/2014, Until Discontinued, Normal    metoprolol tartrate (LOPRESSOR) 25 MG tablet Take 1 tablet (25 mg total) by mouth 2 (two) times daily., Starting 11/07/2014, Until Discontinued, Normal    nitroGLYCERIN (NITROSTAT) 0.4 MG SL tablet Place 1 tablet (0.4 mg total) under the tongue every 5 (five) minutes as needed., Starting 11/07/2014, Until Discontinued, Normal    omeprazole (PRILOSEC) 40 MG capsule Take 1 capsule (  40 mg total) by mouth daily as needed., Starting 11/07/2014, Until Discontinued, Normal    potassium chloride (K-DUR) 10 MEQ tablet Take 10 mEq by mouth daily., Until Discontinued, Historical Med    simvastatin (ZOCOR) 20 MG tablet Take 1 tablet (20 mg total) by mouth at bedtime., Starting 11/07/2014, Until Discontinued, Normal    traMADol (ULTRAM) 50 MG tablet TAKE ONE TABLET BY MOUTH 3 TIMES DAILY, Phone In      STOP taking these medications      acetaminophen (TYLENOL) 325 MG tablet        Allergies  Allergen Reactions  . Fosamax [Alendronate Sodium] Other (See Comments)    Anxious and "I just didn't feel right" after each dose; 2.5 month trial   Follow-up Information    Follow up with Elsie Stain, MD. Schedule an appointment as soon as possible for a visit in 1 week.   Specialty:  Family Medicine   Contact information:   [place] [address] [phone]        The results of significant diagnostics from this hospitalization (including imaging, microbiology, ancillary and laboratory) are listed below for reference.    Significant Diagnostic Studies: Dg Chest 2 View  02/06/2015   CLINICAL DATA:  Left-sided chest pain with shortness of Breath  EXAM: CHEST - 2 VIEW  COMPARISON:  10/27/2014  FINDINGS: Cardiac shadow is stable. A pacing device is again seen. The lungs are clear bilaterally. No focal infiltrate or sizable effusion is noted. Degenerative changes of the thoracic spine are seen.  IMPRESSION: No active disease.   Electronically Signed   By: Inez Catalina M.D.   On: 02/06/2015 21:52    Microbiology: No results found for this or any previous visit (from the past 240 hour(s)).   Labs: Basic Metabolic Panel:  Recent Labs Lab 02/06/15 2051  NA 138  K 4.0  CL 104  CO2 25  GLUCOSE 138*  BUN 22*  CREATININE 1.33*  CALCIUM 9.7   Liver Function Tests: No results for input(s): AST, ALT, ALKPHOS, BILITOT, PROT, ALBUMIN in the last 168 hours. No results for input(s): LIPASE, AMYLASE in the last 168 hours. No results for input(s): AMMONIA in the last 168 hours. CBC:  Recent Labs Lab 02/06/15 2051  WBC 7.5  HGB 12.7  HCT 37.2  MCV 86.9  PLT 164   Cardiac Enzymes:  Recent Labs Lab 02/07/15 0455  TROPONINI <0.03   BNP: BNP (last 3 results) No results for input(s): BNP in the last 8760 hours.  ProBNP (last 3 results) No results for input(s): PROBNP in the last 8760  hours.  CBG: No results for input(s): GLUCAP in the last 168 hours.     SignedDomenic Polite  [facility] 02/07/2015, 5:47 PM

## 2015-02-08 ENCOUNTER — Encounter: Payer: Medicare Other | Admitting: Cardiovascular Disease

## 2015-02-09 ENCOUNTER — Telehealth: Payer: Self-pay | Admitting: *Deleted

## 2015-02-09 NOTE — Telephone Encounter (Signed)
Transition Care Management Follow-up Telephone Call   Date discharged? 02/07/15   How have you been since you were released from the hospital? Patient reports she is not sleeping well but is otherwise improved.  No more chest pain.   Do you understand why you were in the hospital? yes, chest pain   Do you understand the discharge instructions? yes, although some difficulty with memory   Where were you discharged to? Home   Items Reviewed:  Medications reviewed: yes  Allergies reviewed: yes  Dietary changes reviewed: no  Referrals reviewed: yes, cardiology   Functional Questionnaire:   Activities of Daily Living (ADLs):   She states they are independent in the following: ambulation, bathing and hygiene, feeding, continence, grooming, toileting and dressing States they require assistance with the following: None   Any transportation issues/concerns?: no, daughter will drive her   Any patient concerns? no   Confirmed importance and date/time of follow-up visits scheduled yes, 02/11/15 @ 1600  Provider Appointment booked with Dr. Damita Dunnings.  Confirmed with patient if condition begins to worsen call PCP or go to the ER.  Patient was given the office number and encouraged to call back with question or concerns.  : yes

## 2015-02-11 ENCOUNTER — Encounter: Payer: Self-pay | Admitting: Family Medicine

## 2015-02-11 ENCOUNTER — Ambulatory Visit (INDEPENDENT_AMBULATORY_CARE_PROVIDER_SITE_OTHER): Payer: Medicare Other | Admitting: Family Medicine

## 2015-02-11 VITALS — BP 110/50 | HR 74 | Temp 98.4°F | Wt 124.5 lb

## 2015-02-11 DIAGNOSIS — R0789 Other chest pain: Secondary | ICD-10-CM | POA: Diagnosis not present

## 2015-02-11 NOTE — Progress Notes (Signed)
Pre visit review using our clinic review tool, if applicable. No additional management support is needed unless otherwise documented below in the visit note.  To recap, she was out cutting grass on a riding mower and the engine died.  She had to push the mover across a field and then when the ground began to slope downward she jogged along the side of the mover, jumped on, and then steered it down the hill.  She had no CP at that point.  Afterward she had atypical L sided chest pain that was NTG responsive.  She went to ER for eval.  Neg eval at ER, was discharged home with likely noncardiac CP.  Here for f/u today.  Compliant with meds.  D/w pt about diet, exercise and her clearly good exercise capacity, especially for an 81 y/o lady.  No CP now.  No other NTG use.  ER course d/w pt.   BP has usually been 120-150/~70 at home in the meantime.  Not SOB, no BLE edema, no CP now.   Meds, vitals, and allergies reviewed.   ROS: See HPI.  Otherwise, noncontributory.  GEN: nad, alert and oriented HEENT: mucous membranes moist NECK: supple w/o LA CV: rrr. PULM: ctab, no inc wob ABD: soft, +bs EXT: no edema SKIN: no acute rash

## 2015-02-11 NOTE — Patient Instructions (Signed)
I think this was all from a pulled muscle or a muscle spasm.  Take care.  Glad to see you.

## 2015-02-13 NOTE — Assessment & Plan Note (Signed)
Atypical chest pain, now resolved.  She clearly has a source with pushing a large mower across a field.  Her exercise capacity is great.  She is low risk for this being a cardiac event.  Okay for outpatient f/u.  Continue meds as is.  She agrees.

## 2015-02-16 DIAGNOSIS — H2513 Age-related nuclear cataract, bilateral: Secondary | ICD-10-CM | POA: Diagnosis not present

## 2015-02-21 DIAGNOSIS — L821 Other seborrheic keratosis: Secondary | ICD-10-CM | POA: Diagnosis not present

## 2015-02-21 DIAGNOSIS — R202 Paresthesia of skin: Secondary | ICD-10-CM | POA: Diagnosis not present

## 2015-02-21 DIAGNOSIS — L309 Dermatitis, unspecified: Secondary | ICD-10-CM | POA: Diagnosis not present

## 2015-03-04 ENCOUNTER — Other Ambulatory Visit: Payer: Self-pay

## 2015-03-04 DIAGNOSIS — Z1231 Encounter for screening mammogram for malignant neoplasm of breast: Secondary | ICD-10-CM

## 2015-03-08 ENCOUNTER — Encounter: Payer: Medicare Other | Admitting: Cardiovascular Disease

## 2015-03-18 ENCOUNTER — Ambulatory Visit
Admission: RE | Admit: 2015-03-18 | Discharge: 2015-03-18 | Disposition: A | Discharge status: Home / Self Care | Payer: Medicare Other | Source: Ambulatory Visit

## 2015-03-18 DIAGNOSIS — Z1231 Encounter for screening mammogram for malignant neoplasm of breast: Secondary | ICD-10-CM

## 2015-03-22 ENCOUNTER — Ambulatory Visit: Payer: Medicare Other

## 2015-04-28 ENCOUNTER — Other Ambulatory Visit: Payer: Self-pay | Admitting: *Deleted

## 2015-04-28 NOTE — Telephone Encounter (Signed)
Faxed refill request. Last Filled:   11/13/2014   #90 with 5 RF. Please advise.

## 2015-04-29 MED ORDER — TRAMADOL HCL 50 MG PO TABS: ORAL_TABLET | ORAL | Status: DC

## 2015-04-29 NOTE — Telephone Encounter (Signed)
Please call in.  Thanks.   

## 2015-04-29 NOTE — Telephone Encounter (Signed)
Prescription refill called to pharmacy. 

## 2015-05-05 DIAGNOSIS — J301 Allergic rhinitis due to pollen: Secondary | ICD-10-CM | POA: Diagnosis not present

## 2015-05-05 DIAGNOSIS — H903 Sensorineural hearing loss, bilateral: Secondary | ICD-10-CM | POA: Diagnosis not present

## 2015-05-05 DIAGNOSIS — H9313 Tinnitus, bilateral: Secondary | ICD-10-CM | POA: Diagnosis not present

## 2015-05-17 ENCOUNTER — Ambulatory Visit (INDEPENDENT_AMBULATORY_CARE_PROVIDER_SITE_OTHER): Payer: Medicare Other | Admitting: Cardiovascular Disease

## 2015-05-17 ENCOUNTER — Encounter: Payer: Self-pay | Admitting: Cardiovascular Disease

## 2015-05-17 VITALS — BP 134/62 | HR 64 | Ht 59.0 in | Wt 128.1 lb

## 2015-05-17 DIAGNOSIS — I2510 Atherosclerotic heart disease of native coronary artery without angina pectoris: Secondary | ICD-10-CM

## 2015-05-17 DIAGNOSIS — I495 Sick sinus syndrome: Secondary | ICD-10-CM | POA: Insufficient documentation

## 2015-05-17 DIAGNOSIS — Z950 Presence of cardiac pacemaker: Secondary | ICD-10-CM

## 2015-05-17 DIAGNOSIS — Z9861 Coronary angioplasty status: Secondary | ICD-10-CM | POA: Diagnosis not present

## 2015-05-17 DIAGNOSIS — Z45010 Encounter for checking and testing of cardiac pacemaker pulse generator [battery]: Secondary | ICD-10-CM

## 2015-05-17 DIAGNOSIS — I480 Paroxysmal atrial fibrillation: Secondary | ICD-10-CM | POA: Diagnosis not present

## 2015-05-17 LAB — CUP PACEART INCLINIC DEVICE CHECK
Brady Statistic RA Percent Paced: 61 %
Implantable Lead Implant Date: 20160503
Implantable Lead Location: 753860
Lead Channel Impedance Value: 337.5 Ohm
Lead Channel Pacing Threshold Pulse Width: 0.4 ms
Lead Channel Pacing Threshold Pulse Width: 0.4 ms
Lead Channel Pacing Threshold Pulse Width: 0.4 ms
Lead Channel Setting Pacing Amplitude: 1.75 V
Lead Channel Setting Pacing Amplitude: 2.5 V
MDC IDC LEAD IMPLANT DT: 20080331
MDC IDC LEAD LOCATION: 753859
MDC IDC MSMT BATTERY REMAINING LONGEVITY: 129.6
MDC IDC MSMT BATTERY VOLTAGE: 3.01 V
MDC IDC MSMT LEADCHNL RA PACING THRESHOLD AMPLITUDE: 0.75 V
MDC IDC MSMT LEADCHNL RA SENSING INTR AMPL: 2.8 mV
MDC IDC MSMT LEADCHNL RV IMPEDANCE VALUE: 512.5 Ohm
MDC IDC MSMT LEADCHNL RV PACING THRESHOLD AMPLITUDE: 1.25 V
MDC IDC MSMT LEADCHNL RV PACING THRESHOLD AMPLITUDE: 1.25 V
MDC IDC MSMT LEADCHNL RV SENSING INTR AMPL: 12 mV
MDC IDC PG SERIAL: 7766577
MDC IDC SESS DTM: 20161122121742
MDC IDC SET LEADCHNL RV PACING PULSEWIDTH: 0.4 ms
MDC IDC SET LEADCHNL RV SENSING SENSITIVITY: 2 mV
MDC IDC STAT BRADY RV PERCENT PACED: 0.2 %

## 2015-05-17 NOTE — Progress Notes (Signed)
Patient ID: [name], female   DOB: 1928/03/20, 81 y.o.   MRN: [id number]     Cardiology Office Note   Date:  05/17/2015   ID:  [name], DOB [birth year], MRN [id number]  PCP:  Elsie Stain, MD  Cardiologist:   Sanda Klein, MD   Chief Complaint  Patient presents with  . ROV 3 months    patient reports shortness of breath "most anytime"      History of Present Illness: [name] is a 81 y.o. female who presents for SSS and pacemaker follow-up and CAD.  Since her last appointment here she has not been troubled by any change in chronic mild exertional dyspnea. The pacemaker site is well-healed and has not given her any difficulties. She has not had complaints of extracardiac /diaphragmatic stimulation. She has not had angina pectoris and she had a normal nuclear stress test in May 2016. She takes care of all her own housework and mows the lawn with a riding lawnmower. Has not been troubled by palpitations or syncope.  Pacemaker interrogation shows normal device function. The right ventricular lead outputs were changed to chronic settings. All lead parameters are excellent. Estimated generator longevity is 9- 11 years. Underlying rhythm is sinus bradycardia around 50. There is 61% atrial pacing and no ventricular pacing. There have been no episodes of arrhythmia recorded.  She had a drug-eluting stent placed to the right coronary artery in the setting of an inferior wall myocardial infarction in 2008 and had repeat stenting of the distal right coronary artery later in the same year. She received a dual-chamber permanent pacemaker (St. Jude) in 2008 as well.  she underwent a generator change out in May 2016. At that time she also received a new right ventricular lead due to extracardiac stimulation. She has a remote history of paroxysmal atrial fibrillation but has not been documented clinically or by pacemaker follow-up in years. She is not receiving anticoagulation.   Past  Medical History  Diagnosis Date  . Arthritis   . Allergy   . Hyperlipidemia   . Hypertension   . CAD (coronary artery disease)   . Myocardial infarction ([place])   . Heart murmur   . Insomnia   . Fatty liver     on u/s 2010  . Osteoporosis     dxa 2013  . SSS (sick sinus syndrome) ([place])     St. Jude Permanent Pacemaker 09/23/06  . PAF (paroxysmal atrial fibrillation) ([place])   . Symptomatic bradycardia     Past Surgical History  Procedure Laterality Date  . Pacemaker insertion  09/23/06    St.Jude  . Cholecystectomy    . Back surgery    . Carpal tunnel release      left  . Foot surgery      Left  . Hernia repair      R inguinal  . Coronary angioplasty with stent placement  12/03/06    Stent to distal RCA  . Coronary angioplasty with stent placement  09/12/06    Stenting mid distal segment & PTCA alone distal RCA prox. to the posterior descending artery takeoff.  . US echocardiography  11/26/11    prox. septal thickening, mild concentric LVH, mod. ca+ AOV,trace AI,mild to mod valvular aortic stenosis.  Marland Kitchen Ep implantable device N/A 10/26/2014    Procedure: PPM Generator Changeout;  Surgeon: Sanda Klein, MD;  Location: [facility];  Service: Cardiovascular;  Laterality: N/A;  . Ep implantable device N/A 10/26/2014  Procedure: Pacemaker Revision- new RV Lead;  Surgeon: Sanda Klein, MD;  Location: [facility];  Service: Cardiovascular;  Laterality: N/A;  . Coronary stent placement      RCA stent replaced      Current Outpatient Prescriptions  Medication Sig Dispense Refill  . ALPRAZolam (XANAX) 0.25 MG tablet TAKE ONE TABLET BY MOUTH AT BEDTIME AS NEEDED (Patient taking differently: TAKE ONE TABLET BY MOUTH  DAILY AS NEEDED FOR ANXIETY) 30 tablet 5  . aspirin 81 MG tablet Take 81 mg by mouth every other day.     Marland Kitchen BIOTIN PO Take 1 tablet by mouth daily.    . calcium gluconate 500 MG tablet Take 500 mg by mouth daily.     . cetirizine (ZYRTEC) 10 MG tablet Take 10  mg by mouth daily as needed for allergies.    . Cholecalciferol (EQL VITAMIN D3) 1000 UNITS tablet Take 1 tablet (1,000 Units total) by mouth daily.    Marland Kitchen levothyroxine (LEVOTHROID) 25 MCG tablet Take 1 tablet (25 mcg total) by mouth daily before breakfast. 90 tablet 3  . loperamide (IMODIUM) 1 MG/5ML solution Take 4 mg by mouth as needed for diarrhea or loose stools.     Marland Kitchen losartan-hydrochlorothiazide (HYZAAR) 50-12.5 MG per tablet Take 1 tablet by mouth daily. 90 tablet 3  . metoprolol tartrate (LOPRESSOR) 25 MG tablet Take 1 tablet (25 mg total) by mouth 2 (two) times daily. 180 tablet 3  . nitroGLYCERIN (NITROSTAT) 0.4 MG SL tablet Place 1 tablet (0.4 mg total) under the tongue every 5 (five) minutes as needed. 25 tablet 12  . omeprazole (PRILOSEC) 40 MG capsule Take 1 capsule (40 mg total) by mouth daily as needed. (Patient taking differently: Take 40 mg by mouth daily as needed (FOR HEARTBURN). ) 90 capsule 3  . potassium chloride (K-DUR) 10 MEQ tablet Take 10 mEq by mouth daily.    . simvastatin (ZOCOR) 20 MG tablet Take 1 tablet (20 mg total) by mouth at bedtime. 90 tablet 3  . traMADol (ULTRAM) 50 MG tablet TAKE ONE TABLET BY MOUTH 3 TIMES DAILY 90 tablet 5   No current facility-administered medications for this visit.    Allergies:   Fosamax    Social History:  The patient  reports that she has never smoked. She has never used smokeless tobacco. She reports that she does not drink alcohol or use illicit drugs.   Family History:  The patient's family history includes Diabetes in her father; Early death in her mother; Heart disease in her father.    ROS:  Please see the history of present illness.    Otherwise, review of systems positive for none.   All other systems are reviewed and negative.    PHYSICAL EXAM: VS:  BP 134/62 mmHg  Pulse 64  Ht 4\' 11"  (1.499 m)  Wt 128 lb 1.6 oz (58.106 kg)  BMI 25.86 kg/m2 , BMI Body mass index is 25.86 kg/(m^2).  General: Alert, oriented  x3, no distress Head: no evidence of trauma, PERRL, EOMI, no exophtalmos or lid lag, no myxedema, no xanthelasma; normal ears, nose and oropharynx Neck: normal jugular venous pulsations and no hepatojugular reflux; brisk carotid pulses without delay and no carotid bruits Chest: clear to auscultation, no signs of consolidation by percussion or palpation, normal fremitus, symmetrical and full respiratory excursions,  Healed scars in right subclavian pacemaker area Cardiovascular: normal position and quality of the apical impulse, regular rhythm, normal first and second heart sounds, no  murmurs, rubs or gallops Abdomen: no tenderness or distention, no masses by palpation, no abnormal pulsatility or arterial bruits, normal bowel sounds, no hepatosplenomegaly Extremities: no clubbing, cyanosis or edema; 2+ radial, ulnar and brachial pulses bilaterally; 2+ right femoral, posterior tibial and dorsalis pedis pulses; 2+ left femoral, posterior tibial and dorsalis pedis pulses; no subclavian or femoral bruits Neurological: grossly nonfocal Psych: euthymic mood, full affect   EKG:  EKG is not ordered today.    Recent Labs: 10/19/2014: ALT 39* 01/04/2015: TSH 3.75 02/06/2015: BUN 22*; Creatinine, Ser 1.33*; Hemoglobin 12.7; Platelets 164; Potassium 4.0; Sodium 138    Lipid Panel    Component Value Date/Time   CHOL 148 01/04/2015 1005   TRIG 229.0* 01/04/2015 1005   HDL 40.60 01/04/2015 1005   CHOLHDL 4 01/04/2015 1005   VLDL 45.8* 01/04/2015 1005   LDLCALC 71 12/11/2013 1006   LDLDIRECT 79.0 01/04/2015 1005      Wt Readings from Last 3 Encounters:  05/17/15 128 lb 1.6 oz (58.106 kg)  02/11/15 124 lb 8 oz (56.473 kg)  02/07/15 124 lb 12.5 oz (56.6 kg)       ASSESSMENT AND PLAN:  1. CAD, s/p inferior wall MI and RCA stents. No current angina, normal stress perfusion study. 2. Hyperlipidemia, well treated. 3. SSS s/p dual chamber pacemaker, recent generator change and new RV lead.  4.  Reported history of paroxysmal atrial fibrillation, none recorded in years by her pacemaker 5. Essential hypertension, well-controlled   Merlin download every 3 months, office follow-up yearly. Current medicines are reviewed at length with the patient today.  The patient does not have concerns regarding medicines.  The following changes have been made:  no change  Labs/ tests ordered today include:  Orders Placed This Encounter  Procedures  . Implantable device check    Patient Instructions  Remote monitoring is used to monitor your pacemaker from home. This monitoring reduces the number of office visits required to check your device to one time per year. It allows [place] to keep an eye on the functioning of your device to ensure it is working properly. You are scheduled for a device check from home on 08/16/2015. You may send your transmission at any time that day. If you have a wireless device, the transmission will be sent automatically. After your physician reviews your transmission, you will receive a postcard with your next transmission date.  Your physician recommends that you schedule a follow-up appointment in: 12 months with Dr.Kesler Wickham     Signed, Sanda Klein, MD  05/17/2015 9:00 AM    Sanda Klein, MD, [facility] [phone] office [phone] pager

## 2015-05-17 NOTE — Patient Instructions (Signed)
Remote monitoring is used to monitor your pacemaker from home. This monitoring reduces the number of office visits required to check your device to one time per year. It allows [place] to keep an eye on the functioning of your device to ensure it is working properly. You are scheduled for a device check from home on 08/16/2015. You may send your transmission at any time that day. If you have a wireless device, the transmission will be sent automatically. After your physician reviews your transmission, you will receive a postcard with your next transmission date.  Your physician recommends that you schedule a follow-up appointment in: 12 months with Dr.Croitoru

## 2015-05-18 ENCOUNTER — Other Ambulatory Visit: Payer: Self-pay | Admitting: *Deleted

## 2015-05-18 MED ORDER — LEVOTHYROXINE SODIUM 25 MCG PO TABS: 25.0000 ug | ORAL_TABLET | Freq: Every day | ORAL | Status: DC

## 2015-05-18 NOTE — Telephone Encounter (Signed)
Receive faxed refill request from pharmacy for levothyroxine. Refill sent to pharmacy electronically.

## 2015-08-02 ENCOUNTER — Other Ambulatory Visit: Payer: Self-pay | Admitting: Family Medicine

## 2015-08-09 ENCOUNTER — Other Ambulatory Visit: Payer: Self-pay | Admitting: *Deleted

## 2015-08-09 NOTE — Telephone Encounter (Signed)
Faxed refill request from [facility].  Last Filled:   At local pharmacy  30 tablet 5 02/04/2015  Last office visit:   02/11/15 Please advise.   Please print Rx for faxing.

## 2015-08-10 MED ORDER — ALPRAZOLAM 0.25 MG PO TABS: 0.2500 mg | ORAL_TABLET | Freq: Every evening | ORAL | Status: DC | PRN

## 2015-08-10 NOTE — Telephone Encounter (Signed)
Rx faxed to mail order pharmacy as instructed. 

## 2015-08-10 NOTE — Telephone Encounter (Signed)
Printed, please fax.  Thanks.

## 2015-08-16 ENCOUNTER — Ambulatory Visit (INDEPENDENT_AMBULATORY_CARE_PROVIDER_SITE_OTHER): Payer: Medicare Other | Admitting: *Deleted

## 2015-08-16 DIAGNOSIS — I495 Sick sinus syndrome: Secondary | ICD-10-CM

## 2015-08-19 NOTE — Progress Notes (Signed)
Remote pacemaker transmission.   

## 2015-09-07 DIAGNOSIS — C44219 Basal cell carcinoma of skin of left ear and external auricular canal: Secondary | ICD-10-CM | POA: Diagnosis not present

## 2015-09-07 DIAGNOSIS — L728 Other follicular cysts of the skin and subcutaneous tissue: Secondary | ICD-10-CM | POA: Diagnosis not present

## 2015-09-07 DIAGNOSIS — D485 Neoplasm of uncertain behavior of skin: Secondary | ICD-10-CM | POA: Diagnosis not present

## 2015-09-07 DIAGNOSIS — R202 Paresthesia of skin: Secondary | ICD-10-CM | POA: Diagnosis not present

## 2015-09-07 DIAGNOSIS — L821 Other seborrheic keratosis: Secondary | ICD-10-CM | POA: Diagnosis not present

## 2015-09-07 DIAGNOSIS — C44212 Basal cell carcinoma of skin of right ear and external auricular canal: Secondary | ICD-10-CM | POA: Diagnosis not present

## 2015-09-09 ENCOUNTER — Encounter: Payer: Self-pay | Admitting: Cardiology

## 2015-09-09 LAB — CUP PACEART REMOTE DEVICE CHECK
Battery Remaining Percentage: 95.5 %
Brady Statistic AP VP Percent: 1 %
Brady Statistic AP VS Percent: 84 %
Brady Statistic AS VP Percent: 1 %
Brady Statistic AS VS Percent: 16 %
Brady Statistic RV Percent Paced: 1 %
Date Time Interrogation Session: 20170221070026
Implantable Lead Location: 753859
Lead Channel Impedance Value: 450 Ohm
Lead Channel Pacing Threshold Amplitude: 0.75 V
Lead Channel Pacing Threshold Pulse Width: 0.4 ms
Lead Channel Sensing Intrinsic Amplitude: 12 mV
Lead Channel Setting Pacing Amplitude: 1.75 V
MDC IDC LEAD IMPLANT DT: 20080331
MDC IDC LEAD IMPLANT DT: 20160503
MDC IDC LEAD LOCATION: 753860
MDC IDC MSMT BATTERY REMAINING LONGEVITY: 113 mo
MDC IDC MSMT BATTERY VOLTAGE: 3.01 V
MDC IDC MSMT LEADCHNL RA IMPEDANCE VALUE: 340 Ohm
MDC IDC MSMT LEADCHNL RA SENSING INTR AMPL: 1.9 mV
MDC IDC SET LEADCHNL RV PACING AMPLITUDE: 2.5 V
MDC IDC SET LEADCHNL RV PACING PULSEWIDTH: 0.4 ms
MDC IDC SET LEADCHNL RV SENSING SENSITIVITY: 2 mV
MDC IDC STAT BRADY RA PERCENT PACED: 84 %
Pulse Gen Model: 2240
Pulse Gen Serial Number: 7766577

## 2015-09-30 NOTE — Telephone Encounter (Addendum)
Pt request refill alprazolam advised refill was [facility] 08/10/[facility]; pt is not sure if she got that rx; pt takes one tab at hs. Pt will contact [facility] about the 08/10/15 rx and contact office if needed.

## 2015-10-19 DIAGNOSIS — C44311 Basal cell carcinoma of skin of nose: Secondary | ICD-10-CM | POA: Diagnosis not present

## 2015-10-19 DIAGNOSIS — Z85828 Personal history of other malignant neoplasm of skin: Secondary | ICD-10-CM | POA: Diagnosis not present

## 2015-10-19 DIAGNOSIS — C44319 Basal cell carcinoma of skin of other parts of face: Secondary | ICD-10-CM | POA: Diagnosis not present

## 2015-11-17 ENCOUNTER — Other Ambulatory Visit: Payer: Self-pay | Admitting: *Deleted

## 2015-11-17 NOTE — Telephone Encounter (Signed)
Faxed refill request. Last Filled:    90 tablet 1 08/10/2015  Last office visit:   02/11/15  Please advise.

## 2015-11-18 MED ORDER — ALPRAZOLAM 0.25 MG PO TABS: 0.2500 mg | ORAL_TABLET | Freq: Every evening | ORAL | Status: DC | PRN

## 2015-11-18 NOTE — Telephone Encounter (Signed)
Left refill on voice mail at pharmacy  

## 2015-11-18 NOTE — Telephone Encounter (Signed)
Please call in.  Thanks.   

## 2015-12-03 ENCOUNTER — Other Ambulatory Visit: Payer: Self-pay | Admitting: Family Medicine

## 2015-12-06 ENCOUNTER — Ambulatory Visit (INDEPENDENT_AMBULATORY_CARE_PROVIDER_SITE_OTHER): Payer: Medicare Other | Admitting: Cardiovascular Disease

## 2015-12-06 ENCOUNTER — Encounter: Payer: Self-pay | Admitting: Cardiovascular Disease

## 2015-12-06 VITALS — BP 136/66 | HR 65 | Ht 59.0 in | Wt 122.0 lb

## 2015-12-06 DIAGNOSIS — I495 Sick sinus syndrome: Secondary | ICD-10-CM

## 2015-12-06 DIAGNOSIS — E782 Mixed hyperlipidemia: Secondary | ICD-10-CM

## 2015-12-06 DIAGNOSIS — I2510 Atherosclerotic heart disease of native coronary artery without angina pectoris: Secondary | ICD-10-CM | POA: Diagnosis not present

## 2015-12-06 DIAGNOSIS — I10 Essential (primary) hypertension: Secondary | ICD-10-CM

## 2015-12-06 DIAGNOSIS — Z950 Presence of cardiac pacemaker: Secondary | ICD-10-CM

## 2015-12-06 DIAGNOSIS — I480 Paroxysmal atrial fibrillation: Secondary | ICD-10-CM

## 2015-12-06 DIAGNOSIS — Z9861 Coronary angioplasty status: Secondary | ICD-10-CM

## 2015-12-06 LAB — CUP PACEART INCLINIC DEVICE CHECK
Date Time Interrogation Session: 20170613103651
Implantable Lead Implant Date: 20160503
Implantable Lead Location: 753860
Lead Channel Setting Pacing Amplitude: 2.5 V
Lead Channel Setting Pacing Pulse Width: 0.4 ms
MDC IDC LEAD IMPLANT DT: 20080331
MDC IDC LEAD LOCATION: 753859
MDC IDC SET LEADCHNL RA PACING AMPLITUDE: 1.75 V
MDC IDC SET LEADCHNL RV SENSING SENSITIVITY: 2 mV
Pulse Gen Model: 2240
Pulse Gen Serial Number: 7766577

## 2015-12-06 NOTE — Progress Notes (Signed)
Cardiology Office Note    Date:  12/06/2015   ID:  [name], DOB 08-06-1927, MRN [id number]  PCP:  Elsie Stain, MD  Cardiologist:   Sanda Klein, MD   Chief Complaint  Patient presents with  . Follow-up    PACER CHECK  . Shortness of Breath  . Edema    IN HER HANDS THEY TINGLE    History of Present Illness:  [name] is a 82 y.o. female with CAD and sinus node dysfunction with a dual-chamber permanent pacemaker.  She generally feels well. She complains about some neuropathic symptoms in her hands, but denies change in her chronic pattern of exertional dyspnea (NYHA functional class II), also denies edema, angina, syncope, palpitations or claudication.  Her daughter accompanies her today and testifies to a gradual deterioration in her mother's short and mid term memory. She remains physically active and enjoys working in her garden.  She has a loud systolic ejection murmur, shown by echo in 2014 to be related to aortic sclerosis without stenosis. Left ventricular systolic function is normal. She had a normal nuclear perfusion study in May 2016.  Pacemaker interrogation (St. Jude assurity dual-chamber) shows normal device function. All lead parameters are excellent. Estimated generator longevity is 9-10 years. Underlying rhythm is sinus bradycardia around 50. There is 76% atrial pacing and no ventricular pacing. There have been no episodes of atrial or ventricular tachyarrhythmia recorded.  She had a drug-eluting stent placed to the right coronary artery in the setting of an inferior wall myocardial infarction in 2008 and had repeat stenting of the distal right coronary artery later in the same year. She received a dual-chamber permanent pacemaker (St. Jude) in 2008 as well. she underwent a generator change out in May 2016. At that time she also received a new right ventricular lead due to extracardiac stimulation. She has a remote history of paroxysmal atrial fibrillation  but has not been documented clinically or by pacemaker follow-up in years. She is not receiving anticoagulation.  Past Medical History  Diagnosis Date  . Arthritis   . Allergy   . Hyperlipidemia   . Hypertension   . CAD (coronary artery disease)   . Myocardial infarction ([place])   . Heart murmur   . Insomnia   . Fatty liver     on u/s 2010  . Osteoporosis     dxa 2013  . SSS (sick sinus syndrome) ([place])     St. Jude Permanent Pacemaker 09/23/06  . PAF (paroxysmal atrial fibrillation) ([place])   . Symptomatic bradycardia     Past Surgical History  Procedure Laterality Date  . Pacemaker insertion  09/23/06    St.Jude  . Cholecystectomy    . Back surgery    . Carpal tunnel release      left  . Foot surgery      Left  . Hernia repair      R inguinal  . Coronary angioplasty with stent placement  12/03/06    Stent to distal RCA  . Coronary angioplasty with stent placement  09/12/06    Stenting mid distal segment & PTCA alone distal RCA prox. to the posterior descending artery takeoff.  . US echocardiography  11/26/11    prox. septal thickening, mild concentric LVH, mod. ca+ AOV,trace AI,mild to mod valvular aortic stenosis.  Marland Kitchen Ep implantable device N/A 10/26/2014    Procedure: PPM Generator Changeout;  Surgeon: Sanda Klein, MD;  Location: [facility];  Service: Cardiovascular;  Laterality: N/A;  . Ep implantable device N/A 10/26/2014    Procedure: Pacemaker Revision- new RV Lead;  Surgeon: Sanda Klein, MD;  Location: [facility];  Service: Cardiovascular;  Laterality: N/A;  . Coronary stent placement      RCA stent replaced     Current Medications: Outpatient Prescriptions Prior to Visit  Medication Sig Dispense Refill  . ALPRAZolam (XANAX) 0.25 MG tablet Take 1 tablet (0.25 mg total) by mouth at bedtime as needed. 90 tablet 1  . aspirin 81 MG tablet Take 81 mg by mouth every other day.     Marland Kitchen BIOTIN PO Take 1 tablet by mouth daily.    . cetirizine (ZYRTEC) 10 MG  tablet Take 10 mg by mouth daily as needed for allergies.    Marland Kitchen levothyroxine (SYNTHROID, LEVOTHROID) 25 MCG tablet Take 1 tablet by mouth  daily before breakfast 90 tablet 0  . loperamide (IMODIUM) 1 MG/5ML solution Take 4 mg by mouth as needed for diarrhea or loose stools.     Marland Kitchen losartan-hydrochlorothiazide (HYZAAR) 50-12.5 MG tablet Take 1 tablet by mouth  daily 90 tablet 1  . metoprolol tartrate (LOPRESSOR) 25 MG tablet Take 1 tablet by mouth two  times daily 180 tablet 1  . nitroGLYCERIN (NITROSTAT) 0.4 MG SL tablet Place 1 tablet (0.4 mg total) under the tongue every 5 (five) minutes as needed. 25 tablet 12  . omeprazole (PRILOSEC) 40 MG capsule Take 1 capsule by mouth  daily as needed 90 capsule 1  . potassium chloride (K-DUR) 10 MEQ tablet Take 10 mEq by mouth daily.    . simvastatin (ZOCOR) 20 MG tablet Take 1 tablet by mouth at  bedtime 90 tablet 1  . traMADol (ULTRAM) 50 MG tablet TAKE ONE TABLET BY MOUTH 3 TIMES DAILY 90 tablet 5  . calcium gluconate 500 MG tablet Take 500 mg by mouth daily.     . Cholecalciferol (EQL VITAMIN D3) 1000 UNITS tablet Take 1 tablet (1,000 Units total) by mouth daily.     No facility-administered medications prior to visit.     Allergies:   Fosamax   Social History   Social History  . Marital Status: Widowed    Spouse Name: N/A  . Number of Children: N/A  . Years of Education: N/A   Social History Main Topics  . Smoking status: Never Smoker   . Smokeless tobacco: Never Used  . Alcohol Use: No  . Drug Use: No  . Sexual Activity: No   Other Topics Concern  . None   Social History Narrative   Widowed in [place], lives alone   2 daughters, both local   Retired from [employer] work   Enjoys church, Haematologist     Family History:  The patient's family history includes Diabetes in her father; Early death in her mother; Heart disease in her father.   ROS:   Please see the history of present illness.    ROS All other systems reviewed and are  negative.   PHYSICAL EXAM:   VS:  BP 136/66 mmHg  Pulse 65  Ht 4\' 11"  (1.499 m)  Wt 55.339 kg (122 lb)  BMI 24.63 kg/m2   GEN: Well nourished, well developed, in no acute distress HEENT: normal Neck: no JVD, carotid bruits, or masses Cardiac: RRR; early peaking grade 2/6 aortic ejection murmur, no diastolic murmurs, rubs, or gallops,no edema , healthy pacemaker site Respiratory:  clear to auscultation bilaterally, normal work of breathing GI: soft, nontender, nondistended, +  BS MS: no deformity or atrophy Skin: warm and dry, no rash Neuro:  Alert and Oriented x 3, Strength and sensation are intact Psych: euthymic mood, full affect  Wt Readings from Last 3 Encounters:  12/06/15 55.339 kg (122 lb)  05/17/15 58.106 kg (128 lb 1.6 oz)  02/11/15 56.473 kg (124 lb 8 oz)      Studies/Labs Reviewed:   EKG:  EKG is ordered today.  The ekg ordered today demonstrates Normal sinus rhythm, normal tracing  Recent Labs: 01/04/2015: TSH 3.75 02/06/2015: BUN 22*; Creatinine, Ser 1.33*; Hemoglobin 12.7; Platelets 164; Potassium 4.0; Sodium 138   Lipid Panel    Component Value Date/Time   CHOL 148 01/04/2015 1005   TRIG 229.0* 01/04/2015 1005   HDL 40.60 01/04/2015 1005   CHOLHDL 4 01/04/2015 1005   VLDL 45.8* 01/04/2015 1005   LDLCALC 71 12/11/2013 1006   LDLDIRECT 79.0 01/04/2015 1005      ASSESSMENT:    1. CAD S/P PCI 2008   2. SSS (sick sinus syndrome) ([place])   3. Mixed hyperlipidemia   4. PAF (paroxysmal atrial fibrillation) ([place])   5. Essential hypertension, benign   6. Pacemaker      PLAN:  In order of problems listed above:  1. CAD, s/p inferior wall MI and RCA stents. No current angina, normal stress perfusion study. 2. Hyperlipidemia, well treated. 3. SSS s/p dual chamber pacemaker, recent generator change and new RV lead (May 2016).  4. Reported history of paroxysmal atrial fibrillation, none recorded in years by her pacemaker 5. Essential hypertension,  well-controlled 6. Pacemaker with normal function. Continue downloads every 3 months and office visit at least yearly.    Medication Adjustments/Labs and Tests Ordered: Current medicines are reviewed at length with the patient today.  Concerns regarding medicines are outlined above.  Medication changes, Labs and Tests ordered today are listed in the Patient Instructions below. Patient Instructions  Dr Sallyanne Kuster recommends that you continue on your current medications as directed. Please refer to the Current Medication list given to you today.  Remote monitoring is used to monitor your Pacemaker of ICD from home. This monitoring reduces the number of office visits required to check your device to one time per year. It allows [place] to keep an eye on the functioning of your device to ensure it is working properly. You are scheduled for a device check from home on Tuesday, September 12th, 2017. You may send your transmission at any time that day. If you have a wireless device, the transmission will be sent automatically. After your physician reviews your transmission, you will receive a postcard with your next transmission date.   Dr Sallyanne Kuster recommends that you schedule a follow-up appointment in 12 months with a pacemaker check. You will receive a reminder letter in the mail two months in advance. If you don't receive a letter, please call our office to schedule the follow-up appointment.  If you need a refill on your cardiac medications before your next appointment, please call your pharmacy.     Signed, Sanda Klein, MD  12/06/2015 6:13 PM    [facility] [place], [place], [place]  [address] Phone: [phone]; Fax: [phone]

## 2015-12-06 NOTE — Patient Instructions (Signed)
Dr Sallyanne Kuster recommends that you continue on your current medications as directed. Please refer to the Current Medication list given to you today.  Remote monitoring is used to monitor your Pacemaker of ICD from home. This monitoring reduces the number of office visits required to check your device to one time per year. It allows [place] to keep an eye on the functioning of your device to ensure it is working properly. You are scheduled for a device check from home on Tuesday, September 12th, 2017. You may send your transmission at any time that day. If you have a wireless device, the transmission will be sent automatically. After your physician reviews your transmission, you will receive a postcard with your next transmission date.   Dr Sallyanne Kuster recommends that you schedule a follow-up appointment in 12 months with a pacemaker check. You will receive a reminder letter in the mail two months in advance. If you don't receive a letter, please call our office to schedule the follow-up appointment.  If you need a refill on your cardiac medications before your next appointment, please call your pharmacy.

## 2015-12-08 ENCOUNTER — Encounter: Payer: Self-pay | Admitting: Cardiovascular Disease

## 2015-12-09 ENCOUNTER — Encounter: Payer: Self-pay | Admitting: Family Medicine

## 2015-12-09 ENCOUNTER — Ambulatory Visit (INDEPENDENT_AMBULATORY_CARE_PROVIDER_SITE_OTHER): Payer: Medicare Other | Admitting: Family Medicine

## 2015-12-09 VITALS — BP 114/52 | HR 67 | Temp 97.3°F | Wt 124.2 lb

## 2015-12-09 DIAGNOSIS — R413 Other amnesia: Secondary | ICD-10-CM | POA: Diagnosis not present

## 2015-12-09 DIAGNOSIS — Z9861 Coronary angioplasty status: Secondary | ICD-10-CM

## 2015-12-09 DIAGNOSIS — I2510 Atherosclerotic heart disease of native coronary artery without angina pectoris: Secondary | ICD-10-CM | POA: Diagnosis not present

## 2015-12-09 LAB — CBC WITH DIFFERENTIAL/PLATELET
BASOS ABS: 0 {cells}/uL (ref 0–200)
Basophils Relative: 0 %
Eosinophils Absolute: 516 cells/uL — ABNORMAL HIGH (ref 15–500)
Eosinophils Relative: 6 %
HEMATOCRIT: 37.6 % (ref 35.0–45.0)
Hemoglobin: 12.4 g/dL (ref 11.7–15.5)
LYMPHS PCT: 36 %
Lymphs Abs: 3096 cells/uL (ref 850–3900)
MCH: 29.2 pg (ref 27.0–33.0)
MCHC: 33 g/dL (ref 32.0–36.0)
MCV: 88.7 fL (ref 80.0–100.0)
MONO ABS: 860 {cells}/uL (ref 200–950)
MPV: 11.1 fL (ref 7.5–12.5)
Monocytes Relative: 10 %
NEUTROS PCT: 48 %
Neutro Abs: 4128 cells/uL (ref 1500–7800)
Platelets: 153 10*3/uL (ref 140–400)
RBC: 4.24 MIL/uL (ref 3.80–5.10)
RDW: 14 % (ref 11.0–15.0)
WBC: 8.6 10*3/uL (ref 3.8–10.8)

## 2015-12-09 LAB — COMPREHENSIVE METABOLIC PANEL
ALK PHOS: 48 U/L (ref 33–130)
ALT: 26 U/L (ref 6–29)
AST: 34 U/L (ref 10–35)
Albumin: 4.5 g/dL (ref 3.6–5.1)
BUN: 26 mg/dL — AB (ref 7–25)
CHLORIDE: 106 mmol/L (ref 98–110)
CO2: 21 mmol/L (ref 20–31)
CREATININE: 1.3 mg/dL — AB (ref 0.60–0.88)
Calcium: 9.5 mg/dL (ref 8.6–10.4)
GLUCOSE: 90 mg/dL (ref 65–99)
Potassium: 4.3 mmol/L (ref 3.5–5.3)
SODIUM: 140 mmol/L (ref 135–146)
TOTAL PROTEIN: 6.9 g/dL (ref 6.1–8.1)
Total Bilirubin: 0.7 mg/dL (ref 0.2–1.2)

## 2015-12-09 LAB — VITAMIN B12: Vitamin B-12: 343 pg/mL (ref 200–1100)

## 2015-12-09 LAB — TSH: TSH: 2.74 mIU/L

## 2015-12-09 MED ORDER — LOSARTAN POTASSIUM-HCTZ 50-12.5 MG PO TABS: ORAL_TABLET | ORAL | Status: DC

## 2015-12-09 NOTE — Patient Instructions (Signed)
Go to the lab on the way out.  We'll contact you with your lab report. [name] will call about your referral, see her on the way out.  No driving for now.  Cut the LOSARTAN POTASSIUM-HCTZ in half and take a half a tab a day.  See if your walking is better with the lower dose.  I would think about getting a fall button in the meantime.  Take care.  Glad to see you.

## 2015-12-09 NOTE — Progress Notes (Signed)
Pre visit review using our clinic review tool, if applicable. No additional management support is needed unless otherwise documented below in the visit note.  Family has noted memory loss.  Pt has noted some trouble with memory.  "I'm not as quick as remembering things."  More troubles with recent memory, distant memory is intact.  Worse in the last year.  She has had trouble with the lawnmower. Minimal cooking, not leaving the stove on.  She is still keeping house, keeping the house clean; still working in her garden (fewer plants though compared to prev).  Had been driving locally.  She hasn't gotten lost.  No specific event lead to the OV today, but just the gradual changes.  She doesn't have depressive changes.  No new meds.  Rarely mildly lightheaded.  She hasn't fallen but sometimes "my body gets out in front of my feet and my feet have to catch up."  Meds, vitals, and allergies reviewed.   ROS: Per HPI unless specifically indicated in ROS section   GEN: nad, alert, pleasant in conversation.  HEENT: mucous membranes moist NECK: supple w/o LA CV: rrr PULM: ctab, no inc wob ABD: soft, +bs EXT: no edema SKIN: no acute rash CN 2-12 wnl B, S/S/DTR wnl x4  MMSE  22/30.

## 2015-12-10 LAB — RPR

## 2015-12-11 DIAGNOSIS — R413 Other amnesia: Secondary | ICD-10-CM | POA: Insufficient documentation

## 2015-12-11 NOTE — Assessment & Plan Note (Addendum)
ddx d/w pt and family present.   Check basic labs today.   Refer for CT head.   No driving for now.  Cut the LOSARTAN POTASSIUM-HCTZ in half and take a half a tab a day.  We'll see if her walking is better with the lower dose, ie with slightly higher BP I would think about getting a fall button in the meantime, literature given to family.  I am worried that patient will have true dementia; she needs the routine w/u in the meantime.  >45 minutes spent in face to face time with patient, >50% spent in counselling or coordination of care.

## 2015-12-13 ENCOUNTER — Ambulatory Visit
Admission: RE | Admit: 2015-12-13 | Discharge: 2015-12-13 | Disposition: A | Discharge status: Home / Self Care | Payer: Medicare Other | Source: Ambulatory Visit | Attending: Family Medicine | Admitting: Family Medicine

## 2015-12-13 DIAGNOSIS — R413 Other amnesia: Secondary | ICD-10-CM | POA: Diagnosis not present

## 2015-12-22 ENCOUNTER — Other Ambulatory Visit: Payer: Self-pay | Admitting: Family Medicine

## 2015-12-22 ENCOUNTER — Encounter: Payer: Self-pay | Admitting: Family Medicine

## 2015-12-22 ENCOUNTER — Ambulatory Visit (INDEPENDENT_AMBULATORY_CARE_PROVIDER_SITE_OTHER): Payer: Medicare Other | Admitting: Family Medicine

## 2015-12-22 VITALS — BP 122/58 | HR 70 | Temp 97.8°F | Wt 122.0 lb

## 2015-12-22 DIAGNOSIS — I2510 Atherosclerotic heart disease of native coronary artery without angina pectoris: Secondary | ICD-10-CM | POA: Diagnosis not present

## 2015-12-22 DIAGNOSIS — R413 Other amnesia: Secondary | ICD-10-CM

## 2015-12-22 DIAGNOSIS — Z9861 Coronary angioplasty status: Secondary | ICD-10-CM

## 2015-12-22 NOTE — Patient Instructions (Addendum)
Items to consider about memory loss: Safety (appliances, cooking, driving, yard work, Social research officer, government). Possible medicine start (aricept) Support groups Social life - especially if not driving.  Possible referral for a second opinion with neurology.   Think about what we talked about today and update me as needed.  Take care.  Glad to see you.

## 2015-12-22 NOTE — Progress Notes (Signed)
Pre visit review using our clinic review tool, if applicable. No additional management support is needed unless otherwise documented below in the visit note.  Memory changes.  Family had noted repetition of statements.  She is likely at the same baseline as last OV.    Walking is some better recently.  Has been in regular shoes instead of sandals. Her gait is possibly some better in the meantime- it seems to be worse on a day when she is tired.  No falls.  Hyzaar decreased at last OV.    Recent labs and imaging and MMSE d/w pt and daughters.   Meds, vitals, and allergies reviewed.   ROS: Per HPI unless specifically indicated in ROS section   nad ncat Pleasant in conversation.

## 2015-12-22 NOTE — Assessment & Plan Note (Signed)
Likely alzheimer's, d/w pt and family.  Likely progressive.   Still functional at this point, but I advised against driving and high risk situations.  See AVS re: ideas for family to consider in the meantime.   They'll update me after consideration.   All questions answered.  >25 minutes spent in face to face time with patient, >50% spent in counselling or coordination of care.

## 2015-12-23 ENCOUNTER — Other Ambulatory Visit: Payer: Self-pay | Admitting: *Deleted

## 2015-12-23 NOTE — Telephone Encounter (Signed)
Faxed refill request. Last Filled:    90 tablet 1 11/18/2015  Please advise.

## 2015-12-25 NOTE — Telephone Encounter (Signed)
Looks early, check with patient/family.   Thanks.

## 2015-12-26 NOTE — Telephone Encounter (Signed)
Spoke with daughter, Vivien Rota, who says she doesn't need it yet and please disregard request.

## 2015-12-28 ENCOUNTER — Other Ambulatory Visit: Payer: Self-pay | Admitting: *Deleted

## 2015-12-28 ENCOUNTER — Telehealth: Payer: Self-pay | Admitting: Family Medicine

## 2015-12-28 DIAGNOSIS — R413 Other amnesia: Secondary | ICD-10-CM

## 2015-12-28 MED ORDER — ALPRAZOLAM 0.25 MG PO TABS: 0.2500 mg | ORAL_TABLET | Freq: Every evening | ORAL | Status: DC | PRN

## 2015-12-28 NOTE — Telephone Encounter (Signed)
Printed.  Thanks.  

## 2015-12-28 NOTE — Telephone Encounter (Signed)
Received faxed refill request Last refill [facility] 11/18/15 #90/1 Last office visit 12/22/15 See allergy/contrainidcation

## 2015-12-28 NOTE — Telephone Encounter (Signed)
Pt daughter called and requests a callback regarding neurology referral for mother. She has questions. Please call [phone] today before 6:30pm or cell [phone] after.

## 2015-12-28 NOTE — Telephone Encounter (Signed)
I'll try to get in touch with her tomorrow.  Thanks.

## 2015-12-29 NOTE — Telephone Encounter (Signed)
Rx faxed to [facility]

## 2015-12-29 NOTE — Telephone Encounter (Signed)
Called.  Patient wanted second opinion.  I support this.   Referral placed.   Thanks to all involved.

## 2016-01-04 ENCOUNTER — Ambulatory Visit (INDEPENDENT_AMBULATORY_CARE_PROVIDER_SITE_OTHER): Payer: Medicare Other | Admitting: Neurology

## 2016-01-04 ENCOUNTER — Encounter: Payer: Self-pay | Admitting: Neurology

## 2016-01-04 VITALS — BP 140/72 | HR 72 | Ht 59.0 in | Wt 127.0 lb

## 2016-01-04 DIAGNOSIS — Z9861 Coronary angioplasty status: Secondary | ICD-10-CM | POA: Diagnosis not present

## 2016-01-04 DIAGNOSIS — I2510 Atherosclerotic heart disease of native coronary artery without angina pectoris: Secondary | ICD-10-CM

## 2016-01-04 DIAGNOSIS — R413 Other amnesia: Secondary | ICD-10-CM

## 2016-01-04 MED ORDER — DONEPEZIL HCL 5 MG PO TABS: 5.0000 mg | ORAL_TABLET | Freq: Every day | ORAL | Status: DC

## 2016-01-04 NOTE — Patient Instructions (Signed)
   Begin Aricept (donepezil) at 5 mg at night for one month. If this medication is well-tolerated, please call our office and we will call in a prescription for the 10 mg tablets. Look out for side effects that may include nausea, diarrhea, weight loss, or stomach cramps. This medication will also cause a runny nose, therefore there is no need for allergy medications for this purpose.  
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
## 2016-01-19 ENCOUNTER — Telehealth: Payer: Self-pay | Admitting: Family Medicine

## 2016-01-19 NOTE — Telephone Encounter (Signed)
LM for pt to sch CPE and AWV, mn °

## 2016-02-21 DIAGNOSIS — H2513 Age-related nuclear cataract, bilateral: Secondary | ICD-10-CM | POA: Diagnosis not present

## 2016-02-23 ENCOUNTER — Telehealth: Payer: Self-pay | Admitting: Neurology

## 2016-02-23 MED ORDER — DONEPEZIL HCL 10 MG PO TABS: 10.0000 mg | ORAL_TABLET | Freq: Every day | ORAL | 5 refills | Status: DC

## 2016-02-23 NOTE — Telephone Encounter (Signed)
I called the daughter. The patient is calling in for refill for the 5 mg Aricept, I will call in the 10 mg maintenance dose for her at this time.

## 2016-02-23 NOTE — Telephone Encounter (Signed)
Daughter Vivien Rota called to request refill of donepezil (ARICEPT) 5 MG tablet

## 2016-02-28 DIAGNOSIS — K591 Functional diarrhea: Secondary | ICD-10-CM | POA: Diagnosis not present

## 2016-02-28 DIAGNOSIS — R740 Nonspecific elevation of levels of transaminase and lactic acid dehydrogenase [LDH]: Secondary | ICD-10-CM | POA: Diagnosis not present

## 2016-03-06 ENCOUNTER — Encounter: Payer: Medicare Other | Admitting: *Deleted

## 2016-03-06 ENCOUNTER — Telehealth: Payer: Self-pay | Admitting: Cardiology

## 2016-03-06 NOTE — Telephone Encounter (Signed)
LMOVM reminding pt to send remote transmission.   

## 2016-03-09 ENCOUNTER — Encounter: Payer: Self-pay | Admitting: Cardiology

## 2016-03-12 ENCOUNTER — Ambulatory Visit (INDEPENDENT_AMBULATORY_CARE_PROVIDER_SITE_OTHER): Payer: Medicare Other

## 2016-03-12 VITALS — BP 118/60 | HR 60 | Temp 97.4°F | Ht 59.5 in | Wt 118.8 lb

## 2016-03-12 DIAGNOSIS — E559 Vitamin D deficiency, unspecified: Secondary | ICD-10-CM | POA: Diagnosis not present

## 2016-03-12 DIAGNOSIS — E782 Mixed hyperlipidemia: Secondary | ICD-10-CM

## 2016-03-12 DIAGNOSIS — Z0000 Encounter for general adult medical examination without abnormal findings: Secondary | ICD-10-CM

## 2016-03-12 LAB — LIPID PANEL
Cholesterol: 145 mg/dL (ref 0–200)
HDL: 46.4 mg/dL (ref >39.00)
LDL CALC: 73 mg/dL (ref 0–99)
NONHDL: 98.46
Total CHOL/HDL Ratio: 3
Triglycerides: 127 mg/dL (ref 0.0–149.0)
VLDL: 25.4 mg/dL (ref 0.0–40.0)

## 2016-03-12 LAB — VITAMIN D 25 HYDROXY (VIT D DEFICIENCY, FRACTURES): VITD: 39.03 ng/mL (ref 30.00–100.00)

## 2016-03-12 NOTE — Progress Notes (Signed)
Subjective:   [name] is a 82 y.o. female who presents for Medicare Annual (Subsequent) preventive examination.  Review of Systems:  N/A Cardiac Risk Factors include: advanced age (>30men, >87 women);sedentary lifestyle     Objective:     Vitals: BP 118/60 (BP Location: Left Arm, Patient Position: Sitting, Cuff Size: Normal)   Pulse 60 Comment: irregular  Temp 97.4 F (36.3 C) (Oral)   Ht 4' 11.5" (1.511 m)   Wt 118 lb 12 oz (53.9 kg)   SpO2 98%   BMI 23.58 kg/m   Body mass index is 23.58 kg/m.   Tobacco History  Smoking Status  . Never Smoker  Smokeless Tobacco  . Never Used     Counseling given: No   Past Medical History:  Diagnosis Date  . Allergy   . Arthritis   . CAD (coronary artery disease)   . Fatty liver    on u/s 2010  . Heart murmur   . Hyperlipidemia   . Hypertension   . Insomnia   . Myocardial infarction ([place])   . Osteoporosis    dxa 2013  . PAF (paroxysmal atrial fibrillation) ([place])   . SSS (sick sinus syndrome) ([place])    St. Jude Permanent Pacemaker 09/23/06  . Symptomatic bradycardia    Past Surgical History:  Procedure Laterality Date  . BACK SURGERY    . CARPAL TUNNEL RELEASE     left  . CHOLECYSTECTOMY    . CORONARY ANGIOPLASTY WITH STENT PLACEMENT  12/03/06   Stent to distal RCA  . CORONARY ANGIOPLASTY WITH STENT PLACEMENT  09/12/06   Stenting mid distal segment & PTCA alone distal RCA prox. to the posterior descending artery takeoff.  . CORONARY STENT PLACEMENT     RCA stent replaced   . EP IMPLANTABLE DEVICE N/A 10/26/2014   Procedure: PPM Generator Changeout;  Surgeon: Sanda Klein, MD;  Location: [facility];  Service: Cardiovascular;  Laterality: N/A;  . EP IMPLANTABLE DEVICE N/A 10/26/2014   Procedure: Pacemaker Revision- new RV Lead;  Surgeon: Sanda Klein, MD;  Location: [facility];  Service: Cardiovascular;  Laterality: N/A;  . FOOT SURGERY     Left  . HERNIA REPAIR     R inguinal  . PACEMAKER  INSERTION  09/23/06   St.Jude  . US ECHOCARDIOGRAPHY  11/26/11   prox. septal thickening, mild concentric LVH, mod. ca+ AOV,trace AI,mild to mod valvular aortic stenosis.   Family History  Problem Relation Age of Onset  . Early death Mother     died at age 66, unknown cause  . Heart disease Father   . Diabetes Father    History  Sexual Activity  . Sexual activity: No    Outpatient Encounter Prescriptions as of 03/12/2016  Medication Sig  . ALPRAZolam (XANAX) 0.25 MG tablet Take 1 tablet (0.25 mg total) by mouth at bedtime as needed.  Marland Kitchen aspirin 81 MG tablet Take 81 mg by mouth every other day.   Marland Kitchen BIOTIN PO Take 1 tablet by mouth daily.  . cetirizine (ZYRTEC) 10 MG tablet Take 10 mg by mouth daily as needed for allergies.  . cholecalciferol (VITAMIN D) 1000 units tablet Take 1,000 Units by mouth daily.  Marland Kitchen donepezil (ARICEPT) 10 MG tablet Take 1 tablet (10 mg total) by mouth at bedtime.  Marland Kitchen levothyroxine (SYNTHROID, LEVOTHROID) 25 MCG tablet Take 1 tablet by mouth  daily before breakfast  . loperamide (IMODIUM) 1 MG/5ML solution Take 4 mg by mouth  as needed for diarrhea or loose stools.   Marland Kitchen losartan-hydrochlorothiazide (HYZAAR) 50-12.5 MG tablet Take 1 tablet by mouth  daily  . metoprolol tartrate (LOPRESSOR) 25 MG tablet Take 1 tablet by mouth two  times daily  . naproxen sodium (RA NAPROXEN SODIUM) 220 MG tablet Take by mouth.  . nitroGLYCERIN (NITROSTAT) 0.4 MG SL tablet Place 1 tablet (0.4 mg total) under the tongue every 5 (five) minutes as needed.  Marland Kitchen omeprazole (PRILOSEC) 40 MG capsule Take 1 capsule by mouth  daily as needed  . potassium chloride SA (K-DUR,KLOR-CON) 20 MEQ tablet Take 20 mEq by mouth daily.  . simvastatin (ZOCOR) 20 MG tablet Take 1 tablet by mouth at  bedtime  . traMADol (ULTRAM) 50 MG tablet TAKE ONE TABLET BY MOUTH 3 TIMES DAILY   No facility-administered encounter medications on file as of 03/12/2016.     Activities of Daily Living In your present state of  health, do you have any difficulty performing the following activities: 03/12/2016  Hearing? Y  Vision? N  Difficulty concentrating or making decisions? Y  Walking or climbing stairs? N  Dressing or bathing? N  Doing errands, shopping? Y  Preparing Food and eating ? N  Using the Toilet? N  In the past six months, have you accidently leaked urine? Y  Do you have problems with loss of bowel control? Y  Managing your Medications? N  Managing your Finances? N  Housekeeping or managing your Housekeeping? N  Some recent data might be hidden    Patient Care Team: Tonia Ghent, MD as PCP - General (Family Medicine) Estill Cotta, MD as Consulting Physician (Ophthalmology) Ronald Lobo, MD as Consulting Physician (Gastroenterology) Kennieth Francois, MD as Consulting Physician (Dermatology)    Assessment:    Hearing Screening Comments: Audiology exam in past 12 mths revealed hearing loss Vision Screening Comments: Last vision exam with Dr. Thomasene Ripple in August 2017  Exercise Activities and Dietary recommendations Current Exercise Habits: The patient does not participate in regular exercise at present, Exercise limited by: None identified  Goals    . Increase physical activity          Starting 03/12/2016, I will attempt to walk at least 15 min daily, if weather permits.       Fall Risk Fall Risk  03/12/2016 02/04/2015 12/21/2013  Falls in the past year? Yes Yes Yes  Number falls in past yr: 1 1 1   Injury with Fall? Yes Yes -  Follow up Falls evaluation completed;Falls prevention discussed - -   Depression Screen PHQ 2/9 Scores 03/12/2016 02/04/2015 12/21/2013  PHQ - 2 Score 5 0 2  PHQ- 9 Score 12 - 4     Cognitive Testing MMSE - Mini Mental State Exam 03/12/2016 01/04/2016  Not completed: (No Data) -  Orientation to time - 4  Orientation to Place - 5  Registration - 3  Attention/ Calculation - 5  Recall - 2  Language- name 2 objects - 2  Language- repeat - 1  Language-  follow 3 step command - 3  Language- read & follow direction - 1  Write a sentence - 1  Copy design - 1  Total score - 28   PLEASE NOTE: A Mini-Cog screen was completed. Maximum score is 20. A value of 0 denotes this part of [place] MMSE was not completed or the patient failed this part of the Mini-Cog screening.   Mini-Cog Screening Orientation to Time - Max 5 pts Orientation to Place -  Max 5 pts Registration - Max 3 pts Recall - Max 3 pts Language Repeat - Max 1 pts Language Follow 3 Step Command - Max 3 pts  Immunization History  Administered Date(s) Administered  . Influenza Split 05/13/2012  . Tdap 03/05/2011   Screening Tests Health Maintenance  Topic Date Due  . INFLUENZA VACCINE  03/12/2017 (Originally 01/24/2016)  . ZOSTAVAX  03/12/2017 (Originally 07/15/1989)  . PNA vac Low Risk Adult (1 of 2 - PCV13) 03/12/2017 (Originally 07/15/1994)  . MAMMOGRAM  03/17/2016  . TETANUS/TDAP  03/04/2021  . DEXA SCAN  Completed      Plan:     I have personally reviewed and addressed the Medicare Annual Wellness questionnaire and have noted the following in the patient's chart:  A. Medical and social history B. Use of alcohol, tobacco or illicit drugs  C. Current medications and supplements D. Functional ability and status E.  Nutritional status F.  Physical activity G. Advance directives H. List of other physicians I.  Hospitalizations, surgeries, and ER visits in previous 12 months J.  [place] to include hearing, vision, cognitive, depression L. Referrals and appointments - none  In addition, I have reviewed and discussed with patient certain preventive protocols, quality metrics, and best practice recommendations. A written personalized care plan for preventive services as well as general preventive health recommendations were provided to patient.  See attached scanned questionnaire for additional information.   Signed,   Lindell Noe, MHA, BS, LPN Health  Advisor

## 2016-03-12 NOTE — Progress Notes (Signed)
Pre visit review using our clinic review tool, if applicable. No additional management support is needed unless otherwise documented below in the visit note. 

## 2016-03-12 NOTE — Patient Instructions (Signed)
Ms. Perris , Thank you for taking time to come for your Medicare Wellness Visit. I appreciate your ongoing commitment to your health goals. Please review the following plan we discussed and let me know if I can assist you in the future.   These are the goals we discussed: Goals    . Increase physical activity          Starting 03/12/2016, I will attempt to walk at least 15 min daily, if weather permits.        This is a list of the screening recommended for you and due dates:  Health Maintenance  Topic Date Due  . Flu Shot  03/12/2017*  . Shingles Vaccine  03/12/2017*  . Pneumonia vaccines (1 of 2 - PCV13) 03/12/2017*  . Mammogram  03/17/2016  . Tetanus Vaccine  03/04/2021  . DEXA scan (bone density measurement)  Completed  *Topic was postponed. The date shown is not the original due date.   Preventive Care for Adults  A healthy lifestyle and preventive care can promote health and wellness. Preventive health guidelines for adults include the following key practices.  . A routine yearly physical is a good way to check with your health care provider about your health and preventive screening. It is a chance to share any concerns and updates on your health and to receive a thorough exam.  . Visit your dentist for a routine exam and preventive care every 6 months. Brush your teeth twice a day and floss once a day. Good oral hygiene prevents tooth decay and gum disease.  . The frequency of eye exams is based on your age, health, family medical history, use  of contact lenses, and other factors. Follow your health care provider's ecommendations for frequency of eye exams.  . Eat a healthy diet. Foods like vegetables, fruits, whole grains, low-fat dairy products, and lean protein foods contain the nutrients you need without too many calories. Decrease your intake of foods high in solid fats, added sugars, and salt. Eat the right amount of calories for you. Get information about a proper  diet from your health care provider, if necessary.  . Regular physical exercise is one of the most important things you can do for your health. Most adults should get at least 150 minutes of moderate-intensity exercise (any activity that increases your heart rate and causes you to sweat) each week. In addition, most adults need muscle-strengthening exercises on 2 or more days a week.  Silver Sneakers may be a benefit available to you. To determine eligibility, you may visit the website: www.silversneakers.com or contact program at 9076386285 Mon-Fri between 8AM-8PM.   . Maintain a healthy weight. The body mass index (BMI) is a screening tool to identify possible weight problems. It provides an estimate of body fat based on height and weight. Your health care provider can find your BMI and can help you achieve or maintain a healthy weight.   For adults 20 years and older: ? A BMI below 18.5 is considered underweight. ? A BMI of 18.5 to 24.9 is normal. ? A BMI of 25 to 29.9 is considered overweight. ? A BMI of 30 and above is considered obese.   . Maintain normal blood lipids and cholesterol levels by exercising and minimizing your intake of saturated fat. Eat a balanced diet with plenty of fruit and vegetables. Blood tests for lipids and cholesterol should begin at age 44 and be repeated every 5 years. If your lipid or cholesterol levels  are high, you are over 50, or you are at high risk for heart disease, you may need your cholesterol levels checked more frequently. Ongoing high lipid and cholesterol levels should be treated with medicines if diet and exercise are not working.  . If you smoke, find out from your health care provider how to quit. If you do not use tobacco, please do not start.  . If you choose to drink alcohol, please do not consume more than 2 drinks per day. One drink is considered to be 12 ounces (355 mL) of beer, 5 ounces (148 mL) of wine, or 1.5 ounces (44 mL) of  liquor.  . If you are 57-83 years old, ask your health care provider if you should take aspirin to prevent strokes.  . Use sunscreen. Apply sunscreen liberally and repeatedly throughout the day. You should seek shade when your shadow is shorter than you. Protect yourself by wearing long sleeves, pants, a wide-brimmed hat, and sunglasses year round, whenever you are outdoors.  . Once a month, do a whole body skin exam, using a mirror to look at the skin on your back. Tell your health care provider of new moles, moles that have irregular borders, moles that are larger than a pencil eraser, or moles that have changed in shape or color.

## 2016-03-12 NOTE — Progress Notes (Signed)
PCP notes:   Health maintenance:  Flu vaccine - postponed Shingles vaccine - postponed PCV13 - postponed  Abnormal screenings:   Depression score: 12  Patient concerns:   None  Nurse concerns:  None  Next PCP appt:   03/19/2016 @ 0815  I reviewed health advisor's note, was available for consultation on the day of service listed in this note, and agree with documentation and plan. Elsie Stain, MD.

## 2016-03-16 ENCOUNTER — Telehealth: Payer: Self-pay | Admitting: Cardiovascular Disease

## 2016-03-16 NOTE — Telephone Encounter (Signed)
Pt's  Daughter's call needs a call back at  [phone] about pt's Device please.

## 2016-03-16 NOTE — Telephone Encounter (Signed)
New Message  Pt call requesting to speak with RN about a missed transmission. Please call back to discuss

## 2016-03-16 NOTE — Telephone Encounter (Signed)
Spoke w/ pt daughter and rescheduled the remote transmission to 04-02-2016 so it will automatically download. Pt daughter agreed with this plan.

## 2016-03-16 NOTE — Telephone Encounter (Signed)
LMOVM for pt to return call 

## 2016-03-19 ENCOUNTER — Encounter: Payer: Self-pay | Admitting: Family Medicine

## 2016-03-19 ENCOUNTER — Telehealth: Payer: Self-pay | Admitting: Neurology

## 2016-03-19 ENCOUNTER — Ambulatory Visit (INDEPENDENT_AMBULATORY_CARE_PROVIDER_SITE_OTHER): Payer: Medicare Other | Admitting: Family Medicine

## 2016-03-19 DIAGNOSIS — I2510 Atherosclerotic heart disease of native coronary artery without angina pectoris: Secondary | ICD-10-CM

## 2016-03-19 DIAGNOSIS — Z9861 Coronary angioplasty status: Secondary | ICD-10-CM

## 2016-03-19 DIAGNOSIS — Z23 Encounter for immunization: Secondary | ICD-10-CM

## 2016-03-19 DIAGNOSIS — E782 Mixed hyperlipidemia: Secondary | ICD-10-CM

## 2016-03-19 DIAGNOSIS — E039 Hypothyroidism, unspecified: Secondary | ICD-10-CM | POA: Diagnosis not present

## 2016-03-19 DIAGNOSIS — Z0000 Encounter for general adult medical examination without abnormal findings: Secondary | ICD-10-CM | POA: Diagnosis not present

## 2016-03-19 DIAGNOSIS — R413 Other amnesia: Secondary | ICD-10-CM

## 2016-03-19 DIAGNOSIS — M810 Age-related osteoporosis without current pathological fracture: Secondary | ICD-10-CM | POA: Diagnosis not present

## 2016-03-19 MED ORDER — LOSARTAN POTASSIUM-HCTZ 50-12.5 MG PO TABS: ORAL_TABLET | ORAL | Status: DC

## 2016-03-19 MED ORDER — DONEPEZIL HCL 5 MG PO TABS: 5.0000 mg | ORAL_TABLET | Freq: Every day | ORAL | Status: DC

## 2016-03-19 NOTE — Assessment & Plan Note (Signed)
TSH prev wnl, continue as is.  No TMG on exam.

## 2016-03-19 NOTE — Progress Notes (Signed)
Flu vaccine - d/w pt Shingles vaccine - d/w pt.  PCV13 - d/w pt.    Abnormal screenings:  Depression score: 12.  Mood d/w pt.  Has been fatigued.  Sleeping more.  She is frustrated about not driving.  D/w pt.  Mood likely affected by her memory.  She has supportive family.  Her mood is variable, day to day, but still manageable.  She admits to being more nervous.  She has pending med change with aricept.  She has some diarrhea on the aricept at the higher dose.  It seems to start about that point, no other clear cause.  D/w pt.  See AVS.    Mammogram d/w pt.  She'll consider screening. Pros and cons d/w pt.  No breat pain or lumps.   DXA declined at this point.  She didn't want more treatment/meds.    Hypothyroidism.  TSH prev wnl.  No neck mass; some fatigue.  Compliant with med.    Elevated Cholesterol: Using medications without problems: yes Muscle aches: likely not from statin.   Diet compliance:yes Exercise:as tolerated  Tramadol prn for aches, usually for hip or hand pain.    PMH and SH reviewed  Meds, vitals, and allergies reviewed.   ROS: Per HPI unless specifically indicated in ROS section   GEN: nad, alert and pleasant in conversation.  Memory not formally tested.   HEENT: mucous membranes moist NECK: supple w/o LA CV: rrr. SEM noted, old finding.  PULM: ctab, no inc wob ABD: soft, +bs EXT: no edema SKIN: no acute rash

## 2016-03-19 NOTE — Assessment & Plan Note (Signed)
Lipids at goal, labs d/w pt.  No ADE on med, continue as is.

## 2016-03-19 NOTE — Progress Notes (Signed)
Pre visit review using our clinic review tool, if applicable. No additional management support is needed unless otherwise documented below in the visit note. 

## 2016-03-19 NOTE — Assessment & Plan Note (Signed)
Declined further treatment.  This is reasonable.

## 2016-03-19 NOTE — Telephone Encounter (Signed)
Patient's daughter is calling regarding medication Aricept. Since the dosage was updated to 10mg  she has had diarrhea. Patient's daughter would like for the patient to discontinue the medication for about 4 weeks to get completely out of her system. Please call and discuss.

## 2016-03-19 NOTE — Patient Instructions (Addendum)
Please stop the aricept and call the neurology clinic in the meantime.   Check with your insurance to see if they will cover the shingles shot. Let us know when you need refills.  Take care.  Glad to see you.

## 2016-03-19 NOTE — Telephone Encounter (Signed)
Spoke to pt's daughter. She reports that pt has been having 6-8 loose stools per day. She's also experienced increased nasal drip. Daughter plans on stopping Aricept to see if symptoms improve. Agreed to call back w/ any additional concerns or if she would like to try different med for memory in the future.

## 2016-03-19 NOTE — Assessment & Plan Note (Addendum)
Flu vaccine - done today Shingles vaccine - d/w pt.  PCV13 - done today.  Mammogram d/w pt.  She'll consider screening. Pros and cons d/w pt.  No breat pain or lumps.

## 2016-03-19 NOTE — Addendum Note (Signed)
Addended by: Margette Fast on: 03/19/2016 06:18 PM   Modules accepted: Orders

## 2016-03-19 NOTE — Telephone Encounter (Signed)
I called the daughter. The patient has had diarrhea on the 10 mg dose of Aricept, she had been doing well on the 5 mg. They are to stop the Aricept until the bowel function returns to normal. They are then to restart at 5 mg of Aricept daily.

## 2016-03-19 NOTE — Assessment & Plan Note (Signed)
Anxiety could be related to this, along with diarrhea.  Would be reasonable to stop aricept for now, to see if diarrhea resolves.  If not, then update me.  Family to call neuro clinic in the meantime re: substitute med.  I didn't start namenda at [place].  App neuro input. >25 minutes spent in face to face time with patient, >50% spent in counselling or coordination of care.

## 2016-04-02 ENCOUNTER — Ambulatory Visit: Payer: Medicare Other | Admitting: *Deleted

## 2016-04-02 DIAGNOSIS — I495 Sick sinus syndrome: Secondary | ICD-10-CM | POA: Diagnosis not present

## 2016-04-02 NOTE — Progress Notes (Addendum)
Remote pacemaker transmission. Not received

## 2016-04-03 ENCOUNTER — Encounter: Payer: Self-pay | Admitting: Cardiology

## 2016-04-06 ENCOUNTER — Encounter: Payer: Self-pay | Admitting: Cardiology

## 2016-04-19 ENCOUNTER — Encounter: Payer: Self-pay | Admitting: Family Medicine

## 2016-04-19 ENCOUNTER — Ambulatory Visit (INDEPENDENT_AMBULATORY_CARE_PROVIDER_SITE_OTHER): Payer: Medicare Other | Admitting: Family Medicine

## 2016-04-19 DIAGNOSIS — Y92099 Unspecified place in other non-institutional residence as the place of occurrence of the external cause: Secondary | ICD-10-CM

## 2016-04-19 DIAGNOSIS — W19XXXA Unspecified fall, initial encounter: Secondary | ICD-10-CM

## 2016-04-19 DIAGNOSIS — H6690 Otitis media, unspecified, unspecified ear: Secondary | ICD-10-CM | POA: Diagnosis not present

## 2016-04-19 DIAGNOSIS — I2510 Atherosclerotic heart disease of native coronary artery without angina pectoris: Secondary | ICD-10-CM | POA: Diagnosis not present

## 2016-04-19 DIAGNOSIS — R413 Other amnesia: Secondary | ICD-10-CM | POA: Diagnosis not present

## 2016-04-19 DIAGNOSIS — Y92009 Unspecified place in unspecified non-institutional (private) residence as the place of occurrence of the external cause: Secondary | ICD-10-CM

## 2016-04-19 DIAGNOSIS — Z634 Disappearance and death of family member: Secondary | ICD-10-CM | POA: Diagnosis not present

## 2016-04-19 DIAGNOSIS — Z9861 Coronary angioplasty status: Secondary | ICD-10-CM | POA: Diagnosis not present

## 2016-04-19 MED ORDER — AMOXICILLIN-POT CLAVULANATE 875-125 MG PO TABS: 1.0000 | ORAL_TABLET | Freq: Two times a day (BID) | ORAL | 0 refills | Status: DC

## 2016-04-19 NOTE — Assessment & Plan Note (Signed)
Start Augmentin. Prescription sent. Routine care otherwise. Update me as needed.

## 2016-04-19 NOTE — Progress Notes (Signed)
She found her daughter dead recently from presumed CVA.  D/w pt about the events; she wanted me to read and explain the death certificate with acute CVA listed.  We talked about that along with her recent events and living situation.  D/w pt about fall button for home/safety.  Handout given other family members are helping out in the meantime, here with patient today at [facility].  I offered my condolences.    Cold sx.  B ear pain, L eye irritation.   Sx started about 1 week ago.  No fevers.  Some cough, discolored rhinorrhea.   Memory changes prev noted.  Diarrhea is better now- d/w pt about aricept vs diary as a cause.  Off aricept now.  Needed handicap tag for car, done at [facility].   PMH and SH reviewed  ROS: Per HPI unless specifically indicated in ROS section   Meds, vitals, and allergies reviewed.   GEN: nad, alert and oriented HEENT: mucous membranes moist, R TM red but L  TM w/o erythema, nasal exam w/o erythema, clear discharge noted,  OP with cobblestoning, L eye irritated medially w/o discharge NECK: supple w/o LA CV: rrr.   PULM: ctab, no inc wob EXT: no edema SKIN: no acute rash

## 2016-04-19 NOTE — Assessment & Plan Note (Signed)
History of falls at home. See notes above about getting a fall button at home.

## 2016-04-19 NOTE — Assessment & Plan Note (Signed)
She does have some memory loss. It was unclear if her diarrhea was previously related to Aricept or dairy intake. When she gets done with antibiotics for the other issues listed, she can retry Aricept. Reasonable to try 5 mg a day for a few weeks, up to 4 weeks. If tolerated can go up to 10 mg. If she has return of diarrhea they can only attribute to the Aricept, then she or her family can let me know and she can stop the medicine.

## 2016-04-19 NOTE — Assessment & Plan Note (Signed)
She recently found her daughter dead. Presumed CVA. This was discussed with the patient. All questions answered. Offered my condolences.  Offered the patient counseling. She can decide if she wants to go through with this. She does not have to make up her mind at this point. Discussed with patient about her living situation and getting a fall button just fracture safety. Her family will check on this in the meantime. >40 minutes spent in face to face time with patient, >50% spent in counselling or coordination of care.

## 2016-04-19 NOTE — Progress Notes (Signed)
Pre visit review using our clinic review tool, if applicable. No additional management support is needed unless otherwise documented below in the visit note. 

## 2016-04-19 NOTE — Patient Instructions (Addendum)
Start augmentin in the meantime for the ear infection.  It is likely worth retrying the aricept and see if the diarrhea comes back.   Start with 1/2 tab for 4 weeks if no diarrhea.  Then 1 tab a day.  If diarrhea, then stop and let me know.   Take care.  Glad to see you.  Update me as needed.

## 2016-04-21 ENCOUNTER — Encounter: Payer: Self-pay | Admitting: Emergency Medicine

## 2016-04-21 ENCOUNTER — Emergency Department
Admission: EM | Admit: 2016-04-21 | Discharge: 2016-04-21 | Disposition: A | Discharge status: Home / Self Care | Payer: Medicare Other | Attending: Emergency Medicine | Admitting: Emergency Medicine

## 2016-04-21 DIAGNOSIS — M546 Pain in thoracic spine: Secondary | ICD-10-CM | POA: Insufficient documentation

## 2016-04-21 DIAGNOSIS — Z950 Presence of cardiac pacemaker: Secondary | ICD-10-CM | POA: Diagnosis not present

## 2016-04-21 DIAGNOSIS — I10 Essential (primary) hypertension: Secondary | ICD-10-CM | POA: Diagnosis not present

## 2016-04-21 DIAGNOSIS — Z791 Long term (current) use of non-steroidal anti-inflammatories (NSAID): Secondary | ICD-10-CM | POA: Diagnosis not present

## 2016-04-21 DIAGNOSIS — M545 Low back pain: Secondary | ICD-10-CM | POA: Diagnosis not present

## 2016-04-21 DIAGNOSIS — E039 Hypothyroidism, unspecified: Secondary | ICD-10-CM | POA: Insufficient documentation

## 2016-04-21 DIAGNOSIS — I2510 Atherosclerotic heart disease of native coronary artery without angina pectoris: Secondary | ICD-10-CM | POA: Diagnosis not present

## 2016-04-21 DIAGNOSIS — Z79899 Other long term (current) drug therapy: Secondary | ICD-10-CM | POA: Insufficient documentation

## 2016-04-21 DIAGNOSIS — M542 Cervicalgia: Secondary | ICD-10-CM

## 2016-04-21 DIAGNOSIS — Z7982 Long term (current) use of aspirin: Secondary | ICD-10-CM | POA: Diagnosis not present

## 2016-04-21 DIAGNOSIS — I252 Old myocardial infarction: Secondary | ICD-10-CM | POA: Diagnosis not present

## 2016-04-21 LAB — BASIC METABOLIC PANEL
ANION GAP: 12 (ref 5–15)
BUN: 17 mg/dL (ref 6–20)
CO2: 25 mmol/L (ref 22–32)
Calcium: 9.8 mg/dL (ref 8.9–10.3)
Chloride: 99 mmol/L — ABNORMAL LOW (ref 101–111)
Creatinine, Ser: 1.04 mg/dL — ABNORMAL HIGH (ref 0.44–1.00)
GFR calc Af Amer: 55 mL/min — ABNORMAL LOW (ref >60)
GFR, EST NON AFRICAN AMERICAN: 47 mL/min — AB (ref >60)
Glucose, Bld: 163 mg/dL — ABNORMAL HIGH (ref 65–99)
POTASSIUM: 4.4 mmol/L (ref 3.5–5.1)
SODIUM: 136 mmol/L (ref 135–145)

## 2016-04-21 LAB — CBC
HEMATOCRIT: 39.5 % (ref 35.0–47.0)
HEMOGLOBIN: 13.5 g/dL (ref 12.0–16.0)
MCH: 29.6 pg (ref 26.0–34.0)
MCHC: 34.1 g/dL (ref 32.0–36.0)
MCV: 86.9 fL (ref 80.0–100.0)
Platelets: 183 10*3/uL (ref 150–440)
RBC: 4.54 MIL/uL (ref 3.80–5.20)
RDW: 13 % (ref 11.5–14.5)
WBC: 11.3 10*3/uL — AB (ref 3.6–11.0)

## 2016-04-21 NOTE — ED Notes (Addendum)
Pt's daughter is stating that pt was dx with '"pink eye" and ear infection on Thursday this week. Pt's daughter stating pt has also had pain in upper back, neck , and into her back of her head. Pt stating that it feels "tight." Pt has been taking her Tramadol for pain but it is not controlling the sx. Daughter is saying she is having a hard time swallowing too. Pt is stating her throat is sore.

## 2016-04-21 NOTE — ED Provider Notes (Signed)
[facility] Emergency Department Provider Note   ____________________________________________    I have reviewed the triage vital signs and the nursing notes.   HISTORY  Chief Complaint Back Pain; Dysphagia; Neck Pain; and Difficulty Walking  Patient with Alzheimer's dementia   HPI [name] is a 82 y.o. female who presents with complaints of back and neck pain. Patient with a history of Alzheimer's dementia. Daughter reports that patient has complained of upper back and some neck pain. Daughter feels that she may have slept on it wrong. No fevers or chills. No nausea or vomiting. No change in mental status. Currently the patient has no complaints and denies neck pain.   Past Medical History:  Diagnosis Date  . Allergy   . Arthritis   . CAD (coronary artery disease)   . Fatty liver    on u/s 2010  . Heart murmur   . Hyperlipidemia   . Hypertension   . Insomnia   . Myocardial infarction   . Osteoporosis    dxa 2013  . PAF (paroxysmal atrial fibrillation) ([place])   . SSS (sick sinus syndrome) ([place])    St. Jude Permanent Pacemaker 09/23/06  . Symptomatic bradycardia     Patient Active Problem List   Diagnosis Date Noted  . Death of family member 28-Apr-2016  . AOM (acute otitis media) 2016-04-28  . Healthcare maintenance 03/19/2016  . Memory loss 12/11/2015  . SSS (sick sinus syndrome) ([place]) 05/17/2015  . Chest pain, rule out acute myocardial infarction 02/06/2015  . Hypothyroidism 11/02/2014  . PAF- none documented in years, not on anticoagulation 10/27/2014  . Troponin level elevated- 0.10 post pacemaker 10/27/2014  . Diaphragmatic stimulation by pacemaker 10/26/2014  . Fall at home 07/14/2014  . Bruit of right carotid artery 01/26/2014  . Medicare annual wellness visit, subsequent 12/22/2013  . Advance care planning 12/22/2013  . Mixed hyperlipidemia 07/29/2013  . Fatty liver 06/04/2012  . Loose stools 02/01/2012  . Murmur,  cardiac 11/28/2011  . Back pain 08/29/2011  . Osteoporosis 06/26/2011  . Vertigo 03/05/2011  . Neck pain 03/05/2011  . Rash 11/16/2010  . Essential hypertension, benign 11/16/2010  . CAD S/P PCI 2008 11/16/2010  . Pacemaker - dual chamber St. Jude gen change 2016 11/16/2010  . Tinnitus of both ears 11/16/2010  . Insomnia 11/16/2010    Past Surgical History:  Procedure Laterality Date  . BACK SURGERY    . CARPAL TUNNEL RELEASE     left  . CHOLECYSTECTOMY    . CORONARY ANGIOPLASTY WITH STENT PLACEMENT  12/03/06   Stent to distal RCA  . CORONARY ANGIOPLASTY WITH STENT PLACEMENT  09/12/06   Stenting mid distal segment & PTCA alone distal RCA prox. to the posterior descending artery takeoff.  . CORONARY STENT PLACEMENT     RCA stent replaced   . EP IMPLANTABLE DEVICE N/A 10/26/2014   Procedure: PPM Generator Changeout;  Surgeon: Sanda Klein, MD;  Location: [facility];  Service: Cardiovascular;  Laterality: N/A;  . EP IMPLANTABLE DEVICE N/A 10/26/2014   Procedure: Pacemaker Revision- new RV Lead;  Surgeon: Sanda Klein, MD;  Location: [facility];  Service: Cardiovascular;  Laterality: N/A;  . FOOT SURGERY     Left  . HERNIA REPAIR     R inguinal  . PACEMAKER INSERTION  09/23/06   St.Jude  . US ECHOCARDIOGRAPHY  11/26/11   prox. septal thickening, mild concentric LVH, mod. ca+ AOV,trace AI,mild to mod valvular aortic  stenosis.    Prior to Admission medications   Medication Sig Start Date End Date Taking? Authorizing Provider  ALPRAZolam (XANAX) 0.25 MG tablet Take 1 tablet (0.25 mg total) by mouth at bedtime as needed. 12/28/15   Tonia Ghent, MD  amoxicillin-clavulanate (AUGMENTIN) 875-125 MG tablet Take 1 tablet by mouth 2 (two) times daily. 04/19/16   Tonia Ghent, MD  aspirin 81 MG tablet Take 81 mg by mouth every other day.     Historical Provider, MD  BIOTIN PO Take 1 tablet by mouth daily.    Historical Provider, MD  cetirizine (ZYRTEC) 10 MG tablet Take  10 mg by mouth daily as needed for allergies.    Historical Provider, MD  cholecalciferol (VITAMIN D) 1000 units tablet Take 1,000 Units by mouth daily.    Historical Provider, MD  donepezil (ARICEPT) 5 MG tablet Take 1 tablet (5 mg total) by mouth at bedtime. 03/19/16   Kathrynn Ducking, MD  levothyroxine (SYNTHROID, LEVOTHROID) 25 MCG tablet Take 1 tablet by mouth  daily before breakfast 12/22/15   Tonia Ghent, MD  loperamide (IMODIUM) 1 MG/5ML solution Take 4 mg by mouth as needed for diarrhea or loose stools.     Historical Provider, MD  losartan-hydrochlorothiazide [facility]) 50-12.5 MG tablet Take 1/2 tablet by mouth  daily 03/19/16   Tonia Ghent, MD  metoprolol tartrate (LOPRESSOR) 25 MG tablet Take 1 tablet by mouth two  times daily Patient taking differently: Take 0.5 tablet by mouth two  times daily 12/22/15   Tonia Ghent, MD  naproxen sodium (RA NAPROXEN SODIUM) 220 MG tablet Take by mouth.    Historical Provider, MD  nitroGLYCERIN (NITROSTAT) 0.4 MG SL tablet Place 1 tablet (0.4 mg total) under the tongue every 5 (five) minutes as needed. 11/07/14   Tonia Ghent, MD  omeprazole (PRILOSEC) 40 MG capsule Take 1 capsule by mouth  daily as needed 12/22/15   Tonia Ghent, MD  potassium chloride SA (K-DUR,KLOR-CON) 20 MEQ tablet Take 20 mEq by mouth daily.    Historical Provider, MD  simvastatin (ZOCOR) 20 MG tablet Take 1 tablet by mouth at  bedtime 12/22/15   Tonia Ghent, MD  traMADol Veatrice Bourbon) 50 MG tablet TAKE ONE TABLET BY MOUTH 3 TIMES DAILY 04/29/15   Tonia Ghent, MD     Allergies Aricept Reather Littler hcl] and Fosamax [alendronate sodium]  Family History  Problem Relation Age of Onset  . Early death Mother     died at age 16, unknown cause  . Heart disease Father   . Diabetes Father     Social History Social History  Substance Use Topics  . Smoking status: Never Smoker  . Smokeless tobacco: Never Used  . Alcohol use No    Review of  Systems  Constitutional: No fever Reported   Cardiovascular: Denies chest pain. Respiratory: Denies shortness of breath. Gastrointestinal: No abdominal pain.  No nausea, no vomiting.    Musculoskeletal: As above Skin: Negative for rash. Neurological: Negative for headaches or weakness  10-point ROS otherwise negative.  ____________________________________________   PHYSICAL EXAM:  VITAL SIGNS: ED Triage Vitals  Enc Vitals Group     BP 04/21/16 1208 (!) 134/58     Pulse Rate 04/21/16 1208 85     Resp 04/21/16 1208 18     Temp 04/21/16 1208 98.4 F (36.9 C)     Temp Source 04/21/16 1208 Oral     SpO2 04/21/16 1208 98 %  Weight 04/21/16 1209 119 lb (54 kg)     Height --      Head Circumference --      Peak Flow --      Pain Score 04/21/16 1209 8     Pain Loc --      Pain Edu? --      Excl. in [place]? --     Constitutional: Alert.No acute distress. Eyes: Conjunctivae are normal.   Nose: No congestion/rhinnorhea. Mouth/Throat: Mucous membranes are moist.   Neck:  Painless ROM, No vertebral tenderness to palpation Cardiovascular: Normal rate, regular rhythm. Grossly normal heart sounds.  Good peripheral circulation. Respiratory: Normal respiratory effort.  No retractions. Lungs CTAB. Gastrointestinal: Soft and nontender. No distention.   Genitourinary: deferred Musculoskeletal: No lower extremity tenderness nor edema.  Warm and well perfused Neurologic:  Normal speech and language. No gross focal neurologic deficits are appreciated.  Skin:  Skin is warm, dry and intact. No rash noted. Psychiatric: Mood and affect are normal. Speech and behavior are normal.  ____________________________________________   LABS (all labs ordered are listed, but only abnormal results are displayed)  Labs Reviewed  BASIC METABOLIC PANEL - Abnormal; Notable for the following:       Result Value   Chloride 99 (*)    Glucose, Bld 163 (*)    Creatinine, Ser 1.04 (*)    GFR calc non  Af Amer 47 (*)    GFR calc Af Amer 55 (*)    All other components within normal limits  CBC - Abnormal; Notable for the following:    WBC 11.3 (*)    All other components within normal limits  URINALYSIS COMPLETEWITH MICROSCOPIC ([facility] ONLY)   ____________________________________________  EKG  ED ECG REPORT I, Lavonia Drafts, the attending physician, personally viewed and interpreted this ECG.  Date: 04/21/2016 EKG Time: 12:21 PM Rate: 89 Rhythm: normal sinus rhythm QRS Axis: normal Intervals: normal ST/T Wave abnormalities: normal Conduction Disturbances: none Narrative Interpretation: unremarkable  ____________________________________________  RADIOLOGY  None ____________________________________________   PROCEDURES  Procedure(s) performed: No    Critical Care performed: No ____________________________________________   INITIAL IMPRESSION / ASSESSMENT AND PLAN / ED COURSE  Pertinent labs & imaging results that were available during my care of the patient were reviewed by me and considered in my medical decision making (see chart for details).  Daughter feels patient's complaints are primarily muscular in nature. I agree with this. The patient is well-appearing and moving her neck/head without any difficulty. Her exam is benign. No fever. No meningismus. Very mild elevation of white blood cell count in the setting of current upper respiratory infection. We discussed treating with Tylenol and the patient's usual medication tramadol as well as Aleve if needed. The patient is on antibiotics by her PCP, close observation by daughter with strict return precautions.  Clinical Course   ____________________________________________   FINAL CLINICAL IMPRESSION(S) / ED DIAGNOSES  Final diagnoses:  Neck pain      NEW MEDICATIONS STARTED DURING THIS VISIT:  New Prescriptions   No medications on file     Note:  This document was prepared using Dragon voice  recognition software and may include unintentional dictation errors.    Lavonia Drafts, MD 04/21/16 2328

## 2016-04-21 NOTE — ED Triage Notes (Addendum)
Pt to ed with c/o neck pain, upper back pain, headache, shoulder pain, difficulty swallowing, difficulty walking and dizziness x 2 weeks.

## 2016-04-26 ENCOUNTER — Telehealth: Payer: Self-pay

## 2016-04-26 DIAGNOSIS — R413 Other amnesia: Secondary | ICD-10-CM

## 2016-04-26 NOTE — Telephone Encounter (Signed)
If they want to continue with neuro, then I support that.  Either way, I think they would do the same thing with the donepezil in the near future, as discussed at the last OV.  It may make more sense for neuro eval after the retrial of donepezil.   I'll ask for neuro eval in the [place] area to be done in early 2018 as that should allow time for med trial.   Thanks.

## 2016-04-26 NOTE — Telephone Encounter (Signed)
[name] (DPR signed) left v/m;Tina wants to know if Dr Damita Dunnings wants pt to continue to see neurologist; if so [facility] request referral to neurologist in [place] area. [place] request cb.

## 2016-04-27 NOTE — Telephone Encounter (Signed)
[name] advised.  [name] says patient has seen neuro in [place] but now that she [name]) is providing the transportation for the patient, she needs appts in [place].

## 2016-04-29 NOTE — Telephone Encounter (Signed)
Previously ordered. It looks like Ebony Hail has already talked with them to make arrangements for an appointment locally. Thanks.

## 2016-04-30 ENCOUNTER — Other Ambulatory Visit: Payer: Self-pay | Admitting: Family Medicine

## 2016-05-09 ENCOUNTER — Other Ambulatory Visit: Payer: Self-pay | Admitting: Family Medicine

## 2016-05-09 DIAGNOSIS — Z1231 Encounter for screening mammogram for malignant neoplasm of breast: Secondary | ICD-10-CM

## 2016-06-13 DIAGNOSIS — L570 Actinic keratosis: Secondary | ICD-10-CM | POA: Diagnosis not present

## 2016-06-13 DIAGNOSIS — L728 Other follicular cysts of the skin and subcutaneous tissue: Secondary | ICD-10-CM | POA: Diagnosis not present

## 2016-06-13 DIAGNOSIS — R202 Paresthesia of skin: Secondary | ICD-10-CM | POA: Diagnosis not present

## 2016-06-13 DIAGNOSIS — Z85828 Personal history of other malignant neoplasm of skin: Secondary | ICD-10-CM | POA: Diagnosis not present

## 2016-06-13 DIAGNOSIS — X32XXXA Exposure to sunlight, initial encounter: Secondary | ICD-10-CM | POA: Diagnosis not present

## 2016-06-13 DIAGNOSIS — L720 Epidermal cyst: Secondary | ICD-10-CM | POA: Diagnosis not present

## 2016-06-14 ENCOUNTER — Other Ambulatory Visit: Payer: Self-pay

## 2016-06-14 MED ORDER — TRAMADOL HCL 50 MG PO TABS: ORAL_TABLET | ORAL | 5 refills | Status: DC

## 2016-06-14 NOTE — Telephone Encounter (Signed)
plz phone in. 

## 2016-06-14 NOTE — Telephone Encounter (Signed)
[name] left v/m requesting refill tramadol. Call when refilled. [name] said pharmacy has requested x 2 with no response. Do not see request in pts chart. Pt last seen annual 03/19/16 and last refilled # [phone] x 5 on 04/29/15.

## 2016-06-15 NOTE — Telephone Encounter (Signed)
Rx called into pharmacy on voicemail, not open yet

## 2016-07-02 ENCOUNTER — Ambulatory Visit (INDEPENDENT_AMBULATORY_CARE_PROVIDER_SITE_OTHER): Payer: Medicare Other | Admitting: *Deleted

## 2016-07-02 DIAGNOSIS — I495 Sick sinus syndrome: Secondary | ICD-10-CM | POA: Diagnosis not present

## 2016-07-02 NOTE — Progress Notes (Signed)
Remote pacemaker transmission.   

## 2016-07-04 ENCOUNTER — Ambulatory Visit
Admission: RE | Admit: 2016-07-04 | Discharge: 2016-07-04 | Disposition: A | Discharge status: Home / Self Care | Payer: Medicare Other | Source: Ambulatory Visit | Attending: Family Medicine | Admitting: Family Medicine

## 2016-07-04 ENCOUNTER — Encounter: Payer: Self-pay | Admitting: Cardiology

## 2016-07-04 DIAGNOSIS — F0280 Dementia in other diseases classified elsewhere without behavioral disturbance: Secondary | ICD-10-CM | POA: Diagnosis not present

## 2016-07-04 DIAGNOSIS — Z1231 Encounter for screening mammogram for malignant neoplasm of breast: Secondary | ICD-10-CM | POA: Insufficient documentation

## 2016-07-04 DIAGNOSIS — G309 Alzheimer's disease, unspecified: Secondary | ICD-10-CM | POA: Diagnosis not present

## 2016-07-04 DIAGNOSIS — F0150 Vascular dementia without behavioral disturbance: Secondary | ICD-10-CM | POA: Diagnosis not present

## 2016-07-06 ENCOUNTER — Ambulatory Visit: Payer: Medicare Other | Admitting: Neurology

## 2016-07-17 LAB — CUP PACEART REMOTE DEVICE CHECK
Battery Remaining Longevity: 122 mo
Battery Remaining Percentage: 95.5 %
Brady Statistic AP VS Percent: 68 %
Brady Statistic AS VS Percent: 32 %
Date Time Interrogation Session: 20180108070013
Implantable Lead Implant Date: 20080331
Implantable Lead Location: 753860
Lead Channel Impedance Value: 360 Ohm
Lead Channel Pacing Threshold Amplitude: 1.25 V
Lead Channel Pacing Threshold Pulse Width: 0.4 ms
Lead Channel Sensing Intrinsic Amplitude: 1.1 mV
Lead Channel Setting Pacing Amplitude: 2.5 V
Lead Channel Setting Pacing Pulse Width: 0.4 ms
MDC IDC LEAD IMPLANT DT: 20160503
MDC IDC LEAD LOCATION: 753859
MDC IDC MSMT BATTERY VOLTAGE: 3.01 V
MDC IDC MSMT LEADCHNL RA PACING THRESHOLD AMPLITUDE: 0.75 V
MDC IDC MSMT LEADCHNL RA PACING THRESHOLD PULSEWIDTH: 0.4 ms
MDC IDC MSMT LEADCHNL RV IMPEDANCE VALUE: 430 Ohm
MDC IDC MSMT LEADCHNL RV SENSING INTR AMPL: 12 mV
MDC IDC PG IMPLANT DT: 20160503
MDC IDC SET LEADCHNL RA PACING AMPLITUDE: 1.75 V
MDC IDC SET LEADCHNL RV SENSING SENSITIVITY: 2 mV
MDC IDC STAT BRADY AP VP PERCENT: 1 %
MDC IDC STAT BRADY AS VP PERCENT: 1 %
MDC IDC STAT BRADY RA PERCENT PACED: 68 %
MDC IDC STAT BRADY RV PERCENT PACED: 1 %
Pulse Gen Model: 2240
Pulse Gen Serial Number: 7766577

## 2016-07-19 ENCOUNTER — Encounter: Payer: Self-pay | Admitting: Family Medicine

## 2016-07-19 ENCOUNTER — Ambulatory Visit (INDEPENDENT_AMBULATORY_CARE_PROVIDER_SITE_OTHER): Payer: Medicare Other | Admitting: Family Medicine

## 2016-07-19 VITALS — BP 126/64 | HR 72 | Temp 98.5°F | Wt 117.0 lb

## 2016-07-19 DIAGNOSIS — H6981 Other specified disorders of Eustachian tube, right ear: Secondary | ICD-10-CM

## 2016-07-19 DIAGNOSIS — H6980 Other specified disorders of Eustachian tube, unspecified ear: Secondary | ICD-10-CM | POA: Diagnosis not present

## 2016-07-19 MED ORDER — FLUTICASONE PROPIONATE 50 MCG/ACT NA SUSP: 2.0000 | Freq: Every day | NASAL | 1 refills | Status: DC

## 2016-07-19 MED ORDER — DONEPEZIL HCL 10 MG PO TABS: 10.0000 mg | ORAL_TABLET | Freq: Every day | ORAL | Status: DC

## 2016-07-19 MED ORDER — METOPROLOL TARTRATE 25 MG PO TABS: ORAL_TABLET | ORAL | Status: DC

## 2016-07-19 NOTE — Patient Instructions (Signed)
Use flonase and gently try to pop your ears.  If not better, then follow up with Dr. Pryor Ochoa.  Take care.  Glad to see you.

## 2016-07-19 NOTE — Progress Notes (Signed)
"  Stopped up, roaring, trouble when I swallow" with the R ear.  No L sided troubles.  No FCNAVD.  Doesn't feel sick o/w.  Some rhinorrhea noted.    Had seen Dr. Cynda Acres, has f/u pending.    Has wood and kerosene heat at home.    Meds, vitals, and allergies reviewed.   ROS: Per HPI unless specifically indicated in ROS section   GEN: nad, alert and oriented HEENT: mucous membranes moist NECK: supple w/o LA TM w/o erythema, no cerumen, but R ETD noted.

## 2016-07-19 NOTE — Progress Notes (Signed)
Pre visit review using our clinic review tool, if applicable. No additional management support is needed unless otherwise documented below in the visit note. 

## 2016-07-20 DIAGNOSIS — H6980 Other specified disorders of Eustachian tube, unspecified ear: Secondary | ICD-10-CM | POA: Insufficient documentation

## 2016-07-20 NOTE — Assessment & Plan Note (Signed)
Use flonase and gently try to pop ears with valsalva.  If not better, then follow up with Dr. Pryor Ochoa.  Okay for outpatient f/u.  D/w pt and family.

## 2016-08-03 DIAGNOSIS — L309 Dermatitis, unspecified: Secondary | ICD-10-CM | POA: Diagnosis not present

## 2016-08-03 DIAGNOSIS — H903 Sensorineural hearing loss, bilateral: Secondary | ICD-10-CM | POA: Diagnosis not present

## 2016-08-03 DIAGNOSIS — H6981 Other specified disorders of Eustachian tube, right ear: Secondary | ICD-10-CM | POA: Diagnosis not present

## 2016-08-06 ENCOUNTER — Other Ambulatory Visit: Payer: Self-pay | Admitting: Family Medicine

## 2016-08-06 DIAGNOSIS — G309 Alzheimer's disease, unspecified: Secondary | ICD-10-CM | POA: Diagnosis not present

## 2016-08-06 DIAGNOSIS — F0280 Dementia in other diseases classified elsewhere without behavioral disturbance: Secondary | ICD-10-CM | POA: Diagnosis not present

## 2016-08-06 DIAGNOSIS — E539 Vitamin B deficiency, unspecified: Secondary | ICD-10-CM | POA: Diagnosis not present

## 2016-08-06 DIAGNOSIS — F5105 Insomnia due to other mental disorder: Secondary | ICD-10-CM | POA: Diagnosis not present

## 2016-08-06 DIAGNOSIS — F0150 Vascular dementia without behavioral disturbance: Secondary | ICD-10-CM | POA: Diagnosis not present

## 2016-10-01 ENCOUNTER — Ambulatory Visit (INDEPENDENT_AMBULATORY_CARE_PROVIDER_SITE_OTHER): Payer: Medicare Other | Admitting: *Deleted

## 2016-10-01 DIAGNOSIS — I495 Sick sinus syndrome: Secondary | ICD-10-CM | POA: Diagnosis not present

## 2016-10-01 NOTE — Progress Notes (Signed)
Remote pacemaker transmission.   

## 2016-10-03 ENCOUNTER — Encounter: Payer: Self-pay | Admitting: Cardiology

## 2016-10-04 ENCOUNTER — Other Ambulatory Visit: Payer: Self-pay | Admitting: Family Medicine

## 2016-10-04 NOTE — Telephone Encounter (Signed)
Please advise if okay to refill. 

## 2016-10-04 NOTE — Telephone Encounter (Signed)
Sent. Thanks.   

## 2016-10-05 LAB — CUP PACEART REMOTE DEVICE CHECK
Battery Remaining Longevity: 119 mo
Brady Statistic AP VP Percent: 1 %
Brady Statistic AP VS Percent: 73 %
Brady Statistic AS VP Percent: 1 %
Brady Statistic RV Percent Paced: 1 %
Implantable Lead Location: 753860
Lead Channel Impedance Value: 330 Ohm
Lead Channel Pacing Threshold Amplitude: 1.25 V
Lead Channel Sensing Intrinsic Amplitude: 12 mV
Lead Channel Setting Pacing Amplitude: 1.625
Lead Channel Setting Pacing Pulse Width: 0.4 ms
MDC IDC LEAD IMPLANT DT: 20080331
MDC IDC LEAD IMPLANT DT: 20160503
MDC IDC LEAD LOCATION: 753859
MDC IDC MSMT BATTERY REMAINING PERCENTAGE: 95.5 %
MDC IDC MSMT BATTERY VOLTAGE: 3.01 V
MDC IDC MSMT LEADCHNL RA PACING THRESHOLD AMPLITUDE: 0.625 V
MDC IDC MSMT LEADCHNL RA PACING THRESHOLD PULSEWIDTH: 0.4 ms
MDC IDC MSMT LEADCHNL RA SENSING INTR AMPL: 1.7 mV
MDC IDC MSMT LEADCHNL RV IMPEDANCE VALUE: 400 Ohm
MDC IDC MSMT LEADCHNL RV PACING THRESHOLD PULSEWIDTH: 0.4 ms
MDC IDC PG IMPLANT DT: 20160503
MDC IDC SESS DTM: 20180409060014
MDC IDC SET LEADCHNL RV PACING AMPLITUDE: 2.5 V
MDC IDC SET LEADCHNL RV SENSING SENSITIVITY: 2 mV
MDC IDC STAT BRADY AS VS PERCENT: 27 %
MDC IDC STAT BRADY RA PERCENT PACED: 73 %
Pulse Gen Model: 2240
Pulse Gen Serial Number: 7766577

## 2016-11-05 DIAGNOSIS — F0280 Dementia in other diseases classified elsewhere without behavioral disturbance: Secondary | ICD-10-CM | POA: Diagnosis not present

## 2016-11-05 DIAGNOSIS — E539 Vitamin B deficiency, unspecified: Secondary | ICD-10-CM | POA: Diagnosis not present

## 2016-11-05 DIAGNOSIS — F0150 Vascular dementia without behavioral disturbance: Secondary | ICD-10-CM | POA: Diagnosis not present

## 2016-11-05 DIAGNOSIS — G309 Alzheimer's disease, unspecified: Secondary | ICD-10-CM | POA: Diagnosis not present

## 2016-11-05 DIAGNOSIS — F5105 Insomnia due to other mental disorder: Secondary | ICD-10-CM | POA: Diagnosis not present

## 2016-11-22 ENCOUNTER — Other Ambulatory Visit: Payer: Self-pay | Admitting: Family Medicine

## 2016-11-26 ENCOUNTER — Other Ambulatory Visit: Payer: Self-pay | Admitting: Family Medicine

## 2016-11-29 ENCOUNTER — Other Ambulatory Visit: Payer: Self-pay | Admitting: *Deleted

## 2016-11-29 NOTE — Telephone Encounter (Signed)
Electronic refill request. Last office visit:   07/19/16 Last Filled:    90 tablet 1 12/28/2015  Please advise.

## 2016-11-30 MED ORDER — ALPRAZOLAM 0.25 MG PO TABS: 0.2500 mg | ORAL_TABLET | Freq: Every evening | ORAL | 1 refills | Status: DC | PRN

## 2016-11-30 NOTE — Telephone Encounter (Signed)
Faxed to mail order pharmacy

## 2016-11-30 NOTE — Telephone Encounter (Signed)
Printed, please fax in.  Thanks.  

## 2016-12-04 ENCOUNTER — Encounter: Payer: Self-pay | Admitting: Internal Medicine

## 2016-12-04 ENCOUNTER — Ambulatory Visit (INDEPENDENT_AMBULATORY_CARE_PROVIDER_SITE_OTHER): Payer: Medicare Other | Admitting: Internal Medicine

## 2016-12-04 ENCOUNTER — Other Ambulatory Visit
Admission: RE | Admit: 2016-12-04 | Discharge: 2016-12-04 | Disposition: A | Discharge status: Home / Self Care | Payer: Medicare Other | Source: Ambulatory Visit | Attending: Internal Medicine | Admitting: Internal Medicine

## 2016-12-04 ENCOUNTER — Telehealth: Payer: Self-pay | Admitting: Internal Medicine

## 2016-12-04 VITALS — BP 110/54 | HR 61 | Ht 60.0 in | Wt 118.0 lb

## 2016-12-04 DIAGNOSIS — I2510 Atherosclerotic heart disease of native coronary artery without angina pectoris: Secondary | ICD-10-CM | POA: Diagnosis not present

## 2016-12-04 DIAGNOSIS — Z950 Presence of cardiac pacemaker: Secondary | ICD-10-CM

## 2016-12-04 DIAGNOSIS — I480 Paroxysmal atrial fibrillation: Secondary | ICD-10-CM | POA: Diagnosis not present

## 2016-12-04 DIAGNOSIS — I495 Sick sinus syndrome: Secondary | ICD-10-CM

## 2016-12-04 LAB — LIPID PANEL
CHOL/HDL RATIO: 3.5 ratio
Cholesterol: 158 mg/dL (ref 0–200)
HDL: 45 mg/dL (ref >40)
LDL CALC: 62 mg/dL (ref 0–99)
Triglycerides: 257 mg/dL — ABNORMAL HIGH (ref <150)
VLDL: 51 mg/dL — ABNORMAL HIGH (ref 0–40)

## 2016-12-04 NOTE — Telephone Encounter (Signed)
Pt daughter has a question regarding stopping medication-Lopressor-that was advised today. Please call.

## 2016-12-04 NOTE — Progress Notes (Signed)
03/05/17 

## 2016-12-04 NOTE — Progress Notes (Signed)
Patient Care Team: Tonia Ghent, MD as PCP - General (Family Medicine) Estill Cotta, MD as Consulting Physician (Ophthalmology) Ronald Lobo, MD as Consulting Physician (Gastroenterology) Dasher, Rayvon Char, MD as Consulting Physician (Dermatology)   HPI  [name] is a 83 y.o. female Previously followed by Dr. Thermon Leyland with a pacemaker implanted for sinus node dysfunction and a history of coronary artery disease. She has a history of stenting to her RCA 2008 in the context of an inferior wall MI.  She has a history of atrial fibrillation remotely but none documented of late and is not on anticoagulation  Echo EF 2014 normal aortic sclerosis without stenosis Myoview 2016 no ischemia  Records and Results Reviewed* LDL 78  Past Medical History:  Diagnosis Date  . Allergy   . Arthritis   . CAD (coronary artery disease)   . Fatty liver    on u/s 2010  . Heart murmur   . Hyperlipidemia   . Hypertension   . Insomnia   . Myocardial infarction ([place])   . Osteoporosis    dxa 2013  . PAF (paroxysmal atrial fibrillation) ([place])   . SSS (sick sinus syndrome) ([place])    St. Jude Permanent Pacemaker 09/23/06  . Symptomatic bradycardia     Past Surgical History:  Procedure Laterality Date  . BACK SURGERY    . CARPAL TUNNEL RELEASE     left  . CHOLECYSTECTOMY    . CORONARY ANGIOPLASTY WITH STENT PLACEMENT  12/03/06   Stent to distal RCA  . CORONARY ANGIOPLASTY WITH STENT PLACEMENT  09/12/06   Stenting mid distal segment & PTCA alone distal RCA prox. to the posterior descending artery takeoff.  . CORONARY STENT PLACEMENT     RCA stent replaced   . EP IMPLANTABLE DEVICE N/A 10/26/2014   Procedure: PPM Generator Changeout;  Surgeon: Sanda Jeweline Reif, MD;  Location: [facility];  Service: Cardiovascular;  Laterality: N/A;  . EP IMPLANTABLE DEVICE N/A 10/26/2014   Procedure: Pacemaker Revision- new RV Lead;  Surgeon: Sanda Aleksia Freiman, MD;  Location: [facility];   Service: Cardiovascular;  Laterality: N/A;  . FOOT SURGERY     Left  . HERNIA REPAIR     R inguinal  . PACEMAKER INSERTION  09/23/06   St.Jude  . US ECHOCARDIOGRAPHY  11/26/11   prox. septal thickening, mild concentric LVH, mod. ca+ AOV,trace AI,mild to mod valvular aortic stenosis.    Current Outpatient Prescriptions  Medication Sig Dispense Refill  . ALPRAZolam (XANAX) 0.25 MG tablet Take 1 tablet (0.25 mg total) by mouth at bedtime as needed. (Patient taking differently: Take 0.25 mg by mouth as directed. ) 90 tablet 1  . aspirin 81 MG tablet Take 81 mg by mouth daily.     Marland Kitchen augmented betamethasone dipropionate (DIPROLENE-AF) 0.05 % ointment Apply 1 application topically 2 (two) times daily.    Marland Kitchen BIOTIN PO Take 1 tablet by mouth daily.    . cetirizine (ZYRTEC) 10 MG tablet Take 10 mg by mouth daily as needed for allergies.    . cholecalciferol (VITAMIN D) 1000 units tablet Take 1,000 Units by mouth daily.    . cyanocobalamin 1000 MCG tablet Take 1,000 mcg by mouth daily.    Marland Kitchen donepezil (ARICEPT) 10 MG tablet Take 1 tablet (10 mg total) by mouth at bedtime.    . fluticasone (FLONASE) 50 MCG/ACT nasal spray Place 2 sprays into both nostrils daily. 16 g 1  . levothyroxine (SYNTHROID, LEVOTHROID)  25 MCG tablet TAKE 1 TABLET BY MOUTH  DAILY BEFORE BREAKFAST 90 tablet 1  . loperamide (IMODIUM) 1 MG/5ML solution Take 4 mg by mouth as needed for diarrhea or loose stools.     Marland Kitchen losartan-hydrochlorothiazide (HYZAAR) 50-12.5 MG tablet Take 1/2 tablet by mouth  daily    . Melatonin 3 MG TABS Take 3-6 mg by mouth at bedtime as needed.    . naproxen sodium (RA NAPROXEN SODIUM) 220 MG tablet Take by mouth.    . nitroGLYCERIN (NITROSTAT) 0.4 MG SL tablet Place 1 tablet (0.4 mg total) under the tongue every 5 (five) minutes as needed. 25 tablet 12  . omeprazole (PRILOSEC) 40 MG capsule Take 1 capsule by mouth  daily as needed 90 capsule 3  . potassium chloride SA (K-DUR,KLOR-CON) 20 MEQ tablet TAKE 1  TABLET BY MOUTH  DAILY 90 tablet 1  . simvastatin (ZOCOR) 20 MG tablet TAKE 1 TABLET BY MOUTH AT  BEDTIME 90 tablet 0  . traMADol (ULTRAM) 50 MG tablet TAKE ONE TABLET BY MOUTH 3 TIMES DAILY 90 tablet 5   No current facility-administered medications for this visit.     Allergies  Allergen Reactions  . Fosamax [Alendronate Sodium] Other (See Comments)    Anxious and "I just didn't feel right" after each dose; 2.5 month trial      Review of Systems negative except from HPI and PMH  Physical Exam BP (!) 110/54 (BP Location: Left Arm, Patient Position: Sitting, Cuff Size: Normal)   Pulse 61   Ht 5' (1.524 m)   Wt 118 lb (53.5 kg)   BMI 23.05 kg/m  Well developed and well nourished in no acute distress HENT normal E scleral and icterus clear Neck Supple JVP flat; carotids brisk and full Clear to ausculation Device pocket well healed; without hematoma or erythema.  There is no tethering  Regular rate and rhythm, no murmurs gallops or rub Soft with active bowel sounds No clubbing cyanosis  Edema Alert and oriented, grossly normal motor and sensory function Skin Warm and Dry  ECG Atrial pacing   Assessment and  Plan  Coronary artery disease prior stenting  Pacemaker-St. Jude  Sinus node dysfunction  Without symptoms of ischemia  Blood pressure is running a little bit low. We will discontinue her metoprolol. It would also like to discontinue omeprazole. We will do that. She'll begin following her blood pressures at home. In the event that they are sitting below 125/64 Lasix, I've asked him to follow-up with her PCP consider discontinuing her other antihypertensive therapy.  No interval atrial fibrillation. With chronic coronary disease we will keep her on aspirin.  For statin dose is low. We will recheck her LDL. Last time it was near target    Current medicines are reviewed at length with the patient today .  The patient does  have concerns regarding medicines.

## 2016-12-04 NOTE — Telephone Encounter (Signed)
I called and spoke with the patient's daughter- answered questions regarding discontinuation of metoprolol.

## 2016-12-04 NOTE — Patient Instructions (Addendum)
Medication Instructions: - Your physician has recommended you make the following change in your medication:  1) Stop lopressor (metoprolol)  Labwork: - Your physician recommends that you have lab work today: Lipid/ direct LDL  Procedures/Testing: - none ordered  Follow-Up: - Remote monitoring is used to monitor your Pacemaker of ICD from home. This monitoring reduces the number of office visits required to check your device to one time per year. It allows [place] to keep an eye on the functioning of your device to ensure it is working properly. You are scheduled for a device check from home on 03/05/17. You may send your transmission at any time that day. If you have a wireless device, the transmission will be sent automatically. After your physician reviews your transmission, you will receive a postcard with your next transmission date.  - Your physician wants you to follow-up in: 6 months with Christell Faith, PA & 1 year with Dr. Caryl Comes. You will receive a reminder letter in the mail two months in advance. If you don't receive a letter, please call our office to schedule the follow-up appointment.   Any Additional Special Instructions Will Be Listed Below (If Applicable).     If you need a refill on your cardiac medications before your next appointment, please call your pharmacy.

## 2016-12-05 LAB — CUP PACEART INCLINIC DEVICE CHECK
Battery Voltage: 3.01 V
Date Time Interrogation Session: 20180612122552
Implantable Lead Implant Date: 20080331
Implantable Lead Implant Date: 20160503
Implantable Lead Location: 753859
Implantable Lead Location: 753860
Implantable Pulse Generator Implant Date: 20160503
Lead Channel Impedance Value: 437.5 Ohm
Lead Channel Pacing Threshold Amplitude: 1.25 V
Lead Channel Pacing Threshold Pulse Width: 0.4 ms
Lead Channel Sensing Intrinsic Amplitude: 12 mV
Lead Channel Sensing Intrinsic Amplitude: 2.2 mV
Lead Channel Setting Sensing Sensitivity: 2 mV
MDC IDC MSMT BATTERY REMAINING LONGEVITY: 128 mo
MDC IDC MSMT LEADCHNL RA IMPEDANCE VALUE: 337.5 Ohm
MDC IDC MSMT LEADCHNL RA PACING THRESHOLD AMPLITUDE: 0.625 V
MDC IDC MSMT LEADCHNL RV PACING THRESHOLD PULSEWIDTH: 0.4 ms
MDC IDC SET LEADCHNL RA PACING AMPLITUDE: 1.625
MDC IDC SET LEADCHNL RV PACING AMPLITUDE: 2.5 V
MDC IDC SET LEADCHNL RV PACING PULSEWIDTH: 0.4 ms
MDC IDC STAT BRADY RA PERCENT PACED: 75 %
MDC IDC STAT BRADY RV PERCENT PACED: 0.25 %
Pulse Gen Model: 2240
Pulse Gen Serial Number: 7766577

## 2016-12-05 LAB — LDL CHOLESTEROL, DIRECT: LDL DIRECT: 90 mg/dL (ref 0–99)

## 2016-12-11 ENCOUNTER — Other Ambulatory Visit: Payer: Self-pay | Admitting: *Deleted

## 2017-01-10 ENCOUNTER — Encounter: Payer: Self-pay | Admitting: Family Medicine

## 2017-01-10 ENCOUNTER — Ambulatory Visit (INDEPENDENT_AMBULATORY_CARE_PROVIDER_SITE_OTHER): Payer: Medicare Other | Admitting: Family Medicine

## 2017-01-10 VITALS — BP 122/52 | HR 71 | Temp 98.4°F | Wt 118.8 lb

## 2017-01-10 DIAGNOSIS — I2510 Atherosclerotic heart disease of native coronary artery without angina pectoris: Secondary | ICD-10-CM | POA: Diagnosis not present

## 2017-01-10 DIAGNOSIS — I10 Essential (primary) hypertension: Secondary | ICD-10-CM

## 2017-01-10 MED ORDER — LOSARTAN POTASSIUM 50 MG PO TABS: 50.0000 mg | ORAL_TABLET | Freq: Every day | ORAL | 3 refills | Status: DC

## 2017-01-10 NOTE — Patient Instructions (Addendum)
Don't drive.   Limit outside work especially in the heat.   Stop the potassium for now.  Stop losartan/HCTZ.  Change to plain losartan.   Recheck labs in about 2 weeks.  Update me about your BP in about 1 week.   Goal BM about every 2 days, not necessarily every day.  I would try to limit both imodium and miralax.   You can change the xanax to every 3rd day and then stop about about 2 weeks.   Take care.  Glad to see you.

## 2017-01-10 NOTE — Progress Notes (Signed)
Discussion of multiple issues.    She had an episode of syncope after a death in the family in the fall of 2017.  Family helped her up and cleaned her up.  No hx tongue biting, no known SZ hx.  this appeared to be a self-limited episode. She did not have recurrent symptoms.  She vomited and defecated about 1 month ago, after her heart clinic appointment, after an episode of getting too hot when working in the yard, unclear if she had syncope but that is presumed, d/w pt and daughter.  That was after working in the heat, weed eating up to 90 min at a time.  D/w pt.  She is presumed to be at high risk of bad outcomes, d/w pt and family, with prolonged/sig heat exposure.  Her blood pressure has been relatively low recently with diastolic values down into the 24S and systolic values as low as 109.    She drinks some caffeine, not enough water, per family report.    She has some diarrhea, then takes imodium, then overcorrects with miralax.  D/w pt about trying to limit imodium and miralax in general.    She is in the midst of weaning xanax, d/w pt.  Using melatonin at night prn.  She has been able to tolerate that. Her daughter is helping her with her medications.  She has noticed some taste changes chronically. This is not an acute change. This was tabled until we can address other issues. She doesn't have any recent oral lesions.  PMH and SH reviewed  ROS: Per HPI unless specifically indicated in ROS section   Meds, vitals, and allergies reviewed.   GEN: nad, alert and pleasant but she has difficulty with recall of specific historical items, this is at baseline. HEENT: mucous membranes moist, OP wnl NECK: supple w/o LA CV: rrr. PULM: ctab, no inc wob ABD: soft, +bs EXT: no edema SKIN: no acute rash

## 2017-01-11 NOTE — Assessment & Plan Note (Addendum)
There are likely multiple issues going on here. She likely does not drink enough water, drinks some caffeine, works outside in the heat of the day for up to an hour and a half a time, has some memory loss, is on multiple blood pressure medications. Discussed with patient and daughter about trying to simplify her medical regimen and also hopefully make her situation safer. The patient is firmly set on doing is much as she can around the house. She likes to do the weed eating around the house, in the yard. They have 2 battery packs for the weed eater, each battery last about 45 minutes. The daughter could remove one battery to limit the patient's ability to work in the yard as long. The patient, her daughter, and I all agree that the patient wants to be as active as possible. The goal is to try to do this safely as possible. Her daughter supervising her otherwise, managing her medicines. Stop hydrochlorothiazide. Stop potassium. Check blood pressure and update me in about 1 week. Recheck labs in about 2 weeks. We likely need to allow for permissive hypertension to try to avoid orthostatic and presyncopal symptoms.  Her daughter is trying to wean her Xanax, discussed. See med orders.  We can address taste changes later on. I did not see any acute changes on oral exam.  Discussed with patient about trying to limit MiraLAX versus Imodium.  >25 minutes spent in face to face time with patient, >50% spent in counselling or coordination of care.

## 2017-01-18 ENCOUNTER — Telehealth: Payer: Self-pay

## 2017-01-18 NOTE — Telephone Encounter (Signed)
Tina(DPR signed) left v/m requesting cb to receive BP readings; pt seen 01/10/17. Left v/m requesting cb.

## 2017-01-21 NOTE — Telephone Encounter (Signed)
Patient's daughter [name] called back with BP readings 01/07/17- 123/59, 01/09/17 -129/61, 01/11/17- 116/55, 01/12/17- 140/67, 01/13/17- 124/58, 01/15/17 -128/68,  01/16/17 - 128/59, 01/17/17 138/87, 01/18/17- 135/52

## 2017-01-22 NOTE — Telephone Encounter (Signed)
Tina advised. ?

## 2017-01-22 NOTE — Telephone Encounter (Signed)
Those blood pressure readings are not particularly high. If she has any lightheadedness then I would cut the losartan back to a half tablet a day and update me. Otherwise continue as is with current medications. I will await her follow-up labs. Many thanks for the update.

## 2017-01-24 ENCOUNTER — Other Ambulatory Visit: Payer: Self-pay | Admitting: Family Medicine

## 2017-01-24 ENCOUNTER — Other Ambulatory Visit (INDEPENDENT_AMBULATORY_CARE_PROVIDER_SITE_OTHER): Payer: Medicare Other

## 2017-01-24 DIAGNOSIS — I10 Essential (primary) hypertension: Secondary | ICD-10-CM

## 2017-01-24 LAB — BASIC METABOLIC PANEL
BUN: 18 mg/dL (ref 6–23)
CHLORIDE: 105 meq/L (ref 96–112)
CO2: 25 mEq/L (ref 19–32)
Calcium: 9.5 mg/dL (ref 8.4–10.5)
Creatinine, Ser: 1.07 mg/dL (ref 0.40–1.20)
GFR: 51.49 mL/min — AB (ref >60.00)
Glucose, Bld: 107 mg/dL — ABNORMAL HIGH (ref 70–99)
POTASSIUM: 4.2 meq/L (ref 3.5–5.1)
Sodium: 139 mEq/L (ref 135–145)

## 2017-02-21 DIAGNOSIS — H16233 Neurotrophic keratoconjunctivitis, bilateral: Secondary | ICD-10-CM | POA: Diagnosis not present

## 2017-02-21 DIAGNOSIS — H2513 Age-related nuclear cataract, bilateral: Secondary | ICD-10-CM | POA: Diagnosis not present

## 2017-03-04 ENCOUNTER — Other Ambulatory Visit: Payer: Self-pay | Admitting: Family Medicine

## 2017-03-04 NOTE — Telephone Encounter (Signed)
Last filled 06/15/16 #90 last OV 01/10/17 next OV 03/21/17

## 2017-03-04 NOTE — Telephone Encounter (Signed)
Rx called to pharmacy as instructed. Patient notified by telephone and verbalized understanding.

## 2017-03-04 NOTE — Telephone Encounter (Signed)
Please call in.  Thanks.  This is the first request for this that I recall seeing.

## 2017-03-04 NOTE — Telephone Encounter (Signed)
Patient's daughter left a voicemail stating that pharmacy as been trying to get a refill on her Tramadol for over a week now.

## 2017-03-05 ENCOUNTER — Ambulatory Visit (INDEPENDENT_AMBULATORY_CARE_PROVIDER_SITE_OTHER): Payer: Medicare Other | Admitting: *Deleted

## 2017-03-05 ENCOUNTER — Other Ambulatory Visit: Payer: Self-pay | Admitting: *Deleted

## 2017-03-05 DIAGNOSIS — I495 Sick sinus syndrome: Secondary | ICD-10-CM | POA: Diagnosis not present

## 2017-03-05 MED ORDER — LOSARTAN POTASSIUM 50 MG PO TABS: 50.0000 mg | ORAL_TABLET | Freq: Every day | ORAL | 3 refills | Status: DC

## 2017-03-05 MED ORDER — DONEPEZIL HCL 10 MG PO TABS: 10.0000 mg | ORAL_TABLET | Freq: Every day | ORAL | 3 refills | Status: DC

## 2017-03-05 NOTE — Telephone Encounter (Signed)
Faxed refill request.  Last office visit:   01/10/17 Last Filled:   Tramadol 90 tablet 1 03/04/2017

## 2017-03-05 NOTE — Progress Notes (Signed)
Remote pacemaker transmission.   

## 2017-03-06 ENCOUNTER — Encounter: Payer: Self-pay | Admitting: Cardiology

## 2017-03-07 ENCOUNTER — Other Ambulatory Visit: Payer: Self-pay | Admitting: *Deleted

## 2017-03-07 LAB — CUP PACEART REMOTE DEVICE CHECK
Implantable Lead Implant Date: 20080331
Implantable Lead Implant Date: 20160503
Implantable Lead Location: 753860
Lead Channel Setting Pacing Amplitude: 1.625
Lead Channel Setting Pacing Pulse Width: 0.4 ms
Lead Channel Setting Sensing Sensitivity: 2 mV
MDC IDC LEAD LOCATION: 753859
MDC IDC PG IMPLANT DT: 20160503
MDC IDC PG SERIAL: 7766577
MDC IDC SESS DTM: 20180913145008
MDC IDC SET LEADCHNL RV PACING AMPLITUDE: 2.5 V

## 2017-03-07 MED ORDER — TRAMADOL HCL 50 MG PO TABS: 50.0000 mg | ORAL_TABLET | Freq: Three times a day (TID) | ORAL | 1 refills | Status: DC | PRN

## 2017-03-07 NOTE — Telephone Encounter (Signed)
Faxed refill request. Tramadol Last office visit:   01/10/17 Last Filled:    90 tablet 1 03/04/2017  Refill went to local pharmacy.  This request is from [facility]. Please advise.

## 2017-03-07 NOTE — Telephone Encounter (Signed)
Printed.  Please fax in. Thanks.  

## 2017-03-08 NOTE — Telephone Encounter (Signed)
Faxed to [facility]

## 2017-03-11 ENCOUNTER — Other Ambulatory Visit: Payer: Self-pay | Admitting: Family Medicine

## 2017-03-14 ENCOUNTER — Ambulatory Visit (INDEPENDENT_AMBULATORY_CARE_PROVIDER_SITE_OTHER): Payer: Medicare Other

## 2017-03-14 ENCOUNTER — Other Ambulatory Visit: Payer: Self-pay | Admitting: Family Medicine

## 2017-03-14 ENCOUNTER — Other Ambulatory Visit: Payer: Self-pay

## 2017-03-14 VITALS — BP 118/64 | HR 63 | Temp 97.5°F | Ht 59.0 in | Wt 117.1 lb

## 2017-03-14 DIAGNOSIS — Z0000 Encounter for general adult medical examination without abnormal findings: Secondary | ICD-10-CM

## 2017-03-14 DIAGNOSIS — R413 Other amnesia: Secondary | ICD-10-CM | POA: Diagnosis not present

## 2017-03-14 DIAGNOSIS — I10 Essential (primary) hypertension: Secondary | ICD-10-CM

## 2017-03-14 DIAGNOSIS — M810 Age-related osteoporosis without current pathological fracture: Secondary | ICD-10-CM

## 2017-03-14 DIAGNOSIS — Z23 Encounter for immunization: Secondary | ICD-10-CM

## 2017-03-14 LAB — HEPATIC FUNCTION PANEL
ALBUMIN: 4.6 g/dL (ref 3.5–5.2)
ALK PHOS: 44 U/L (ref 39–117)
ALT: 30 U/L (ref 0–35)
AST: 29 U/L (ref 0–37)
Bilirubin, Direct: 0.2 mg/dL (ref 0.0–0.3)
TOTAL PROTEIN: 7.3 g/dL (ref 6.0–8.3)
Total Bilirubin: 1.4 mg/dL — ABNORMAL HIGH (ref 0.2–1.2)

## 2017-03-14 LAB — TSH: TSH: 3.03 u[IU]/mL (ref 0.35–4.50)

## 2017-03-14 LAB — VITAMIN D 25 HYDROXY (VIT D DEFICIENCY, FRACTURES): VITD: 40.81 ng/mL (ref 30.00–100.00)

## 2017-03-14 LAB — VITAMIN B12: Vitamin B-12: 675 pg/mL (ref 211–911)

## 2017-03-14 NOTE — Telephone Encounter (Signed)
Patient in today for AWV. Needs refill for Flonase.

## 2017-03-14 NOTE — Progress Notes (Signed)
Subjective:   [name] is a 83 y.o. female who presents for Medicare Annual (Subsequent) preventive examination.  Review of Systems:  N/A Cardiac Risk Factors include: advanced age (>87men, >55 women);dyslipidemia;hypertension     Objective:     Vitals: BP 118/64 (BP Location: Right Arm, Patient Position: Sitting, Cuff Size: Normal)   Pulse 63   Temp (!) 97.5 F (36.4 C) (Oral)   Ht 4\' 11"  (1.499 m) Comment: no shoes  Wt 117 lb 2 oz (53.1 kg)   SpO2 98%   BMI 23.66 kg/m   Body mass index is 23.66 kg/m.   Tobacco History  Smoking Status  . Never Smoker  Smokeless Tobacco  . Never Used     Counseling given: No   Past Medical History:  Diagnosis Date  . Allergy   . Arthritis   . CAD (coronary artery disease)   . Fatty liver    on u/s 2010  . Heart murmur   . Hyperlipidemia   . Hypertension   . Insomnia   . Myocardial infarction ([place])   . Osteoporosis    dxa 2013  . PAF (paroxysmal atrial fibrillation) ([place])   . SSS (sick sinus syndrome) ([place])    St. Jude Permanent Pacemaker 09/23/06  . Symptomatic bradycardia    Past Surgical History:  Procedure Laterality Date  . BACK SURGERY    . CARPAL TUNNEL RELEASE     left  . CHOLECYSTECTOMY    . CORONARY ANGIOPLASTY WITH STENT PLACEMENT  12/03/06   Stent to distal RCA  . CORONARY ANGIOPLASTY WITH STENT PLACEMENT  09/12/06   Stenting mid distal segment & PTCA alone distal RCA prox. to the posterior descending artery takeoff.  . CORONARY STENT PLACEMENT     RCA stent replaced   . EP IMPLANTABLE DEVICE N/A 10/26/2014   Procedure: PPM Generator Changeout;  Surgeon: Sanda Klein, MD;  Location: [facility];  Service: Cardiovascular;  Laterality: N/A;  . EP IMPLANTABLE DEVICE N/A 10/26/2014   Procedure: Pacemaker Revision- new RV Lead;  Surgeon: Sanda Klein, MD;  Location: [facility];  Service: Cardiovascular;  Laterality: N/A;  . FOOT SURGERY     Left  . HERNIA REPAIR     R inguinal  .  PACEMAKER INSERTION  09/23/06   St.Jude  . US ECHOCARDIOGRAPHY  11/26/11   prox. septal thickening, mild concentric LVH, mod. ca+ AOV,trace AI,mild to mod valvular aortic stenosis.   Family History  Problem Relation Age of Onset  . Early death Mother        died at age 74, unknown cause  . Heart disease Father   . Diabetes Father    History  Sexual Activity  . Sexual activity: No    Outpatient Encounter Prescriptions as of 03/14/2017  Medication Sig  . aspirin 81 MG tablet Take 81 mg by mouth daily.   . cholecalciferol (VITAMIN D) 1000 units tablet Take 1,000 Units by mouth daily.  . cyanocobalamin 1000 MCG tablet Take 1,000 mcg by mouth daily.  Marland Kitchen donepezil (ARICEPT) 10 MG tablet Take 1 tablet (10 mg total) by mouth at bedtime.  . fluticasone (FLONASE) 50 MCG/ACT nasal spray Place 2 sprays into both nostrils daily as needed for allergies or rhinitis.  Marland Kitchen levothyroxine (SYNTHROID, LEVOTHROID) 25 MCG tablet TAKE 1 TABLET BY MOUTH  DAILY BEFORE BREAKFAST  . losartan (COZAAR) 50 MG tablet Take 1 tablet (50 mg total) by mouth daily.  . Melatonin 3 MG TABS Take  3-6 mg by mouth at bedtime as needed.  . naproxen sodium (RA NAPROXEN SODIUM) 220 MG tablet Take by mouth as needed.   . simvastatin (ZOCOR) 20 MG tablet TAKE 1 TABLET BY MOUTH AT  BEDTIME  . traMADol (ULTRAM) 50 MG tablet Take 1 tablet (50 mg total) by mouth 3 (three) times daily as needed.  . ALPRAZolam (XANAX) 0.25 MG tablet Take one half tablet (0.125 mg total) every 3rd day for 2 weeks then stop   . augmented betamethasone dipropionate (DIPROLENE-AF) 0.05 % ointment Apply 1 application topically as needed.   . cetirizine (ZYRTEC) 10 MG tablet Take 10 mg by mouth daily as needed for allergies.  . nitroGLYCERIN (NITROSTAT) 0.4 MG SL tablet Place 1 tablet (0.4 mg total) under the tongue every 5 (five) minutes as needed. (Patient not taking: Reported on 03/14/2017)  . omeprazole (PRILOSEC) 40 MG capsule TAKE 1 CAPSULE BY MOUTH  DAILY AS  NEEDED (Patient not taking: Reported on 03/14/2017)   No facility-administered encounter medications on file as of 03/14/2017.     Activities of Daily Living In your present state of health, do you have any difficulty performing the following activities: 03/14/2017  Hearing? Y  Vision? N  Difficulty concentrating or making decisions? Y  Walking or climbing stairs? Y  Dressing or bathing? N  Doing errands, shopping? Y  Preparing Food and eating ? N  Using the Toilet? N  In the past six months, have you accidently leaked urine? N  Do you have problems with loss of bowel control? Y  Managing your Medications? Y  Managing your Finances? Y  Housekeeping or managing your Housekeeping? N  Some recent data might be hidden    Patient Care Team: Tonia Ghent, MD as PCP - General (Family Medicine) Dingeldein, Remo Lipps, MD as Consulting Physician (Ophthalmology) Ronald Lobo, MD as Consulting Physician (Gastroenterology) Dasher, Rayvon Char, MD as Consulting Physician (Dermatology)    Assessment:     Hearing Screening   125Hz  250Hz  500Hz  1000Hz  2000Hz  3000Hz  4000Hz  6000Hz  8000Hz   Right ear:   40 0 0  0    Left ear:   40 0 0  0    Vision Screening Comments: Last vision exam in August 2018   Exercise Activities and Dietary recommendations Current Exercise Habits: The patient does not participate in regular exercise at present, Exercise limited by: None identified  Goals    . Increase physical activity          Starting 03/14/2017, I will attempt to walk at least 15 min daily, if weather permits.       Fall Risk Fall Risk  03/14/2017 03/12/2016 02/04/2015 12/21/2013  Falls in the past year? No Yes Yes Yes  Comment - pt fell on front porch of home - patient states she was putting clothes and slipped and bumped head--patient states she had no known injuries  Number falls in past yr: - 1 1 1   Injury with Fall? - Yes Yes -  Follow up - Falls evaluation completed;Falls prevention discussed  - -   Depression Screen PHQ 2/9 Scores 03/14/2017 03/12/2016 02/04/2015 12/21/2013  PHQ - 2 Score 5 5 0 2  PHQ- 9 Score 12 12 - 4     Cognitive Function MMSE - Mini Mental State Exam 03/14/2017 03/12/2016 01/04/2016  Not completed: - (No Data) -  Orientation to time 5 - 4  Orientation to Place 5 - 5  Registration 3 - 3  Attention/ Calculation 0 - 5  Recall 0 - 2  Recall-comments pt was unable to recall 3 of 3 words - -  Language- name 2 objects 0 - 2  Language- repeat 1 - 1  Language- follow 3 step command 2 - 3  Language- follow 3 step command-comments pt was unable to follow 1 step of 3 step command - -  Language- read & follow direction 0 - 1  Write a sentence 0 - 1  Copy design 0 - 1  Total score 16 - 28     PLEASE NOTE: A Mini-Cog screen was completed. Maximum score is 20. A value of 0 denotes this part of [place] MMSE was not completed or the patient failed this part of the Mini-Cog screening.   Mini-Cog Screening Orientation to Time - Max 5 pts Orientation to Place - Max 5 pts Registration - Max 3 pts Recall - Max 3 pts Language Repeat - Max 1 pts Language Follow 3 Step Command - Max 3 pts     Immunization History  Administered Date(s) Administered  . Influenza Split 05/13/2012  . Influenza,inj,Quad PF,6+ Mos 03/19/2016  . Pneumococcal Conjugate-13 03/19/2016  . Tdap 03/05/2011   Screening Tests Health Maintenance  Topic Date Due  . MAMMOGRAM  07/04/2017  . TETANUS/TDAP  03/04/2021  . INFLUENZA VACCINE  Completed  . DEXA SCAN  Completed  . PNA vac Low Risk Adult  Completed      Plan:     I have personally reviewed and addressed the Medicare Annual Wellness questionnaire and have noted the following in the patient's chart:  A. Medical and social history B. Use of alcohol, tobacco or illicit drugs  C. Current medications and supplements D. Functional ability and status E.  Nutritional status F.  Physical activity G. Advance directives H. List of other  physicians I.  Hospitalizations, surgeries, and ER visits in previous 12 months J.  [place] to include hearing, vision, cognitive, depression L. Referrals and appointments - none  In addition, I have reviewed and discussed with patient certain preventive protocols, quality metrics, and best practice recommendations. A written personalized care plan for preventive services as well as general preventive health recommendations were provided to patient.  See attached scanned questionnaire for additional information.   Signed,   Lindell Noe, MHA, BS, LPN Health Coach

## 2017-03-14 NOTE — Progress Notes (Signed)
Pre visit review using our clinic review tool, if applicable. No additional management support is needed unless otherwise documented below in the visit note. 

## 2017-03-14 NOTE — Progress Notes (Signed)
PCP notes:   Health maintenance:  Flu vaccine - administered PPSV23 - administered  Abnormal screenings:   Depression score: 12 Mini-Cog score: 16/20 Hearing - failed  Hearing Screening   125Hz  250Hz  500Hz  1000Hz  2000Hz  3000Hz  4000Hz  6000Hz  8000Hz   Right ear:   40 0 0  0    Left ear:   40 0 0  0     Patient/family concerns:   Shingrix - PCP please discuss at next appt  Assistive devices - daughter [name] wants to discuss patient using an assistive device for walking due to unsteady gait  Loss of sense of taste - per daughter, this remains a concern for patient  Hives - per daughter, patient has had intermittent episodes of hives with unknown cause; treated with Benadryl cream and hydrocortisone cream  Sleep management- patient reports trouble falling asleep. Goes to bed at [facility] but does not fall asleep until 12AM. Wakes at 7AM or 8AM. Daughter wants discuss use of Melatonin and its effectiveness.   Nurse concerns:  None  Next PCP appt:   03/21/17 @ 1045  I reviewed health advisor's note, was available for consultation on the day of service listed in this note, and agree with documentation and plan. Elsie Stain, MD.

## 2017-03-14 NOTE — Patient Instructions (Signed)
[name] , Thank you for taking time to come for your Medicare Wellness Visit. I appreciate your ongoing commitment to your health goals. Please review the following plan we discussed and let me know if I can assist you in the future.   These are the goals we discussed: Goals    . Increase physical activity          Starting 03/14/2017, I will attempt to walk at least 15 min daily, if weather permits.        This is a list of the screening recommended for you and due dates:  Health Maintenance  Topic Date Due  . Mammogram  07/04/2017  . Tetanus Vaccine  03/04/2021  . Flu Shot  Completed  . DEXA scan (bone density measurement)  Completed  . Pneumonia vaccines  Completed   Preventive Care for Adults  A healthy lifestyle and preventive care can promote health and wellness. Preventive health guidelines for adults include the following key practices.  . A routine yearly physical is a good way to check with your health care provider about your health and preventive screening. It is a chance to share any concerns and updates on your health and to receive a thorough exam.  . Visit your dentist for a routine exam and preventive care every 6 months. Brush your teeth twice a day and floss once a day. Good oral hygiene prevents tooth decay and gum disease.  . The frequency of eye exams is based on your age, health, family medical history, use  of contact lenses, and other factors. Follow your health care provider's ecommendations for frequency of eye exams.  . Eat a healthy diet. Foods like vegetables, fruits, whole grains, low-fat dairy products, and lean protein foods contain the nutrients you need without too many calories. Decrease your intake of foods high in solid fats, added sugars, and salt. Eat the right amount of calories for you. Get information about a proper diet from your health care provider, if necessary.  . Regular physical exercise is one of the most important things you can  do for your health. Most adults should get at least 150 minutes of moderate-intensity exercise (any activity that increases your heart rate and causes you to sweat) each week. In addition, most adults need muscle-strengthening exercises on 2 or more days a week.  Silver Sneakers may be a benefit available to you. To determine eligibility, you may visit the website: www.silversneakers.com or contact program at (307)767-7257 Mon-Fri between 8AM-8PM.   . Maintain a healthy weight. The body mass index (BMI) is a screening tool to identify possible weight problems. It provides an estimate of body fat based on height and weight. Your health care provider can find your BMI and can help you achieve or maintain a healthy weight.   For adults 20 years and older: ? A BMI below 18.5 is considered underweight. ? A BMI of 18.5 to 24.9 is normal. ? A BMI of 25 to 29.9 is considered overweight. ? A BMI of 30 and above is considered obese.   . Maintain normal blood lipids and cholesterol levels by exercising and minimizing your intake of saturated fat. Eat a balanced diet with plenty of fruit and vegetables. Blood tests for lipids and cholesterol should begin at age 69 and be repeated every 5 years. If your lipid or cholesterol levels are high, you are over 50, or you are at high risk for heart disease, you may need your cholesterol levels checked more  frequently. Ongoing high lipid and cholesterol levels should be treated with medicines if diet and exercise are not working.  . If you smoke, find out from your health care provider how to quit. If you do not use tobacco, please do not start.  . If you choose to drink alcohol, please do not consume more than 2 drinks per day. One drink is considered to be 12 ounces (355 mL) of beer, 5 ounces (148 mL) of wine, or 1.5 ounces (44 mL) of liquor.  . If you are 44-55 years old, ask your health care provider if you should take aspirin to prevent strokes.  . Use  sunscreen. Apply sunscreen liberally and repeatedly throughout the day. You should seek shade when your shadow is shorter than you. Protect yourself by wearing long sleeves, pants, a wide-brimmed hat, and sunglasses year round, whenever you are outdoors.  . Once a month, do a whole body skin exam, using a mirror to look at the skin on your back. Tell your health care provider of new moles, moles that have irregular borders, moles that are larger than a pencil eraser, or moles that have changed in shape or color.

## 2017-03-15 ENCOUNTER — Other Ambulatory Visit: Payer: Self-pay | Admitting: *Deleted

## 2017-03-15 MED ORDER — FLUTICASONE PROPIONATE 50 MCG/ACT NA SUSP: 2.0000 | Freq: Every day | NASAL | 99 refills | Status: DC | PRN

## 2017-03-15 NOTE — Telephone Encounter (Signed)
Sent. Thanks.   

## 2017-03-21 ENCOUNTER — Encounter: Payer: Medicare Other | Admitting: Family Medicine

## 2017-04-09 ENCOUNTER — Encounter: Payer: Self-pay | Admitting: Family Medicine

## 2017-04-09 ENCOUNTER — Ambulatory Visit (INDEPENDENT_AMBULATORY_CARE_PROVIDER_SITE_OTHER): Payer: Medicare Other | Admitting: Family Medicine

## 2017-04-09 VITALS — BP 122/58 | HR 65 | Temp 98.2°F | Ht 60.0 in | Wt 119.0 lb

## 2017-04-09 DIAGNOSIS — L509 Urticaria, unspecified: Secondary | ICD-10-CM

## 2017-04-09 DIAGNOSIS — I2510 Atherosclerotic heart disease of native coronary artery without angina pectoris: Secondary | ICD-10-CM

## 2017-04-09 DIAGNOSIS — Z0000 Encounter for general adult medical examination without abnormal findings: Secondary | ICD-10-CM

## 2017-04-09 DIAGNOSIS — R439 Unspecified disturbances of smell and taste: Secondary | ICD-10-CM

## 2017-04-09 DIAGNOSIS — E782 Mixed hyperlipidemia: Secondary | ICD-10-CM | POA: Diagnosis not present

## 2017-04-09 DIAGNOSIS — R269 Unspecified abnormalities of gait and mobility: Secondary | ICD-10-CM | POA: Diagnosis not present

## 2017-04-09 DIAGNOSIS — K5900 Constipation, unspecified: Secondary | ICD-10-CM

## 2017-04-09 DIAGNOSIS — I10 Essential (primary) hypertension: Secondary | ICD-10-CM | POA: Diagnosis not present

## 2017-04-09 DIAGNOSIS — E039 Hypothyroidism, unspecified: Secondary | ICD-10-CM | POA: Diagnosis not present

## 2017-04-09 DIAGNOSIS — R413 Other amnesia: Secondary | ICD-10-CM

## 2017-04-09 DIAGNOSIS — Z7189 Other specified counseling: Secondary | ICD-10-CM

## 2017-04-09 MED ORDER — LORATADINE 10 MG PO TABS: 10.0000 mg | ORAL_TABLET | Freq: Every day | ORAL | Status: DC

## 2017-04-09 MED ORDER — LEVOTHYROXINE SODIUM 25 MCG PO TABS: ORAL_TABLET | ORAL | 3 refills | Status: DC

## 2017-04-09 MED ORDER — NITROGLYCERIN 0.4 MG SL SUBL: 0.4000 mg | SUBLINGUAL_TABLET | SUBLINGUAL | 12 refills | Status: AC | PRN

## 2017-04-09 NOTE — Progress Notes (Signed)
Flu vaccine - administered PPSV23 - administered Shingrix - PCP please discuss at next appt Mammogram declined at this point.   DXA- deferred at this point.  D/w pt and daughter.   Hearing - failed, declined hearing aids.  D/w pt.    Mini-Cog score: 16/20.  Still on donepezil.  Some days are better than others.  She is still mainly independent at home, with family help.  No ADE on med.  Family has noted some variation in memory, some days better than others, but she tends to repeat herself more than prev.  Depression score: 12.  She has limited social interactions.  D/w pt about going to senior center, but family couldn't get her motivated to go.  Social limitations likely affect her mood.  The goal is to work on gait with PT, see below, and go from there.  Pt and family agree.    Gait d/w pt.  Patient/family asked about using a cane.  D/w pt about options. She has more gait changes when more fatigued, with smaller steps noted at that point.    Loss of sense of taste - per daughter, this remains a concern for patient.  No clear cause.  No obvious oral changes.    Hypothyroidism.  TSH wnl.  No neck mass.  No ADE on med.    H/o constipation.  Treated with inc in fiber and prn miralax.  No ADE on med.    Hives - per daughter, patient has had intermittent episodes of hives with unknown cause; treated with Benadryl pills and hydrocortisone cream.  Going on for years.  No clear trigger.  D/w pt and daughter about options.  Unclear if related to taste changes.    Sleep management- had been taking melatonin, up to 6mg  at night.  Still with some trouble sleeping some of the nights, but this seems to be related to level of activity in the day.    Hypertension:    Using medication without problems or lightheadedness: yes Chest pain with exertion:no Edema:no Short of breath:no Prev labs d/w pt.   D/w pt about avoiding heat of the day for yard work.   Elevated Cholesterol: Using medications  without problems: yes Muscle aches: no Diet compliance: encouraged.   Exercise: as tolerated.    PMH and SH reviewed  ROS: Per HPI unless specifically indicated in ROS section   Meds, vitals, and allergies reviewed.   GEN: nad, alert and oriented HEENT: mucous membranes moist NECK: supple w/o LA CV: rrr PULM: ctab, no inc wob ABD: soft, +bs EXT: no edema SKIN: no acute rash Able to bear weight but B choppy gait noted.

## 2017-04-09 NOTE — Patient Instructions (Addendum)
Check with your insurance to see if they will cover the shingrix shot. Rosaria Ferries will call about your referral. Use flonase and claritin daily.  Update me as needed.  Take care.  Glad to see you.

## 2017-04-10 DIAGNOSIS — R439 Unspecified disturbances of smell and taste: Secondary | ICD-10-CM | POA: Insufficient documentation

## 2017-04-10 DIAGNOSIS — R269 Unspecified abnormalities of gait and mobility: Secondary | ICD-10-CM | POA: Insufficient documentation

## 2017-04-10 DIAGNOSIS — K5900 Constipation, unspecified: Secondary | ICD-10-CM | POA: Insufficient documentation

## 2017-04-10 DIAGNOSIS — L509 Urticaria, unspecified: Secondary | ICD-10-CM | POA: Insufficient documentation

## 2017-04-10 NOTE — Assessment & Plan Note (Signed)
Refer to PT. Discussed with patient. She agrees.

## 2017-04-10 NOTE — Assessment & Plan Note (Signed)
No clear trigger. Reasonable for trial of Claritin to see if this will make a difference. No sx currently.

## 2017-04-10 NOTE — Assessment & Plan Note (Signed)
TSH wnl.  No neck mass.  No ADE on med.  Continue as is.

## 2017-04-10 NOTE — Assessment & Plan Note (Signed)
Reasonable to continue statin. No adverse effect on med. Labs discussed with patient and daughter.

## 2017-04-10 NOTE — Assessment & Plan Note (Signed)
Reasonable control. Continue as is. No change in meds. Routine cautions given to patient about dehydration, especially working out in the yard during the heat.

## 2017-04-10 NOTE — Assessment & Plan Note (Signed)
Family is supportive. Checking on patient. Helping her with daily tasks. Mini-Cog score: 16/20.  Still on donepezil.  Some days are better than others.  She is still mainly independent at home, with family help.  No ADE on med.  Family has noted some variation in memory, some days better than others, but she tends to repeat herself more than prev.  Depression score: 12.  She has limited social interactions.  D/w pt about going to senior center, but family couldn't get her motivated to go.  Social limitations likely affect her mood.  The goal is to work on gait with PT to mobilize patient more and go from there.  Pt and family agree.  We elected not to change her memory medications at this point.

## 2017-04-10 NOTE — Assessment & Plan Note (Signed)
Flu vaccine - administered PPSV23 - administered Shingrix - PCP please discuss at next appt Mammogram declined at this point.   DXA- deferred at this point.  D/w pt and daughter.   Hearing - failed, declined hearing aids.  D/w pt.

## 2017-04-10 NOTE — Assessment & Plan Note (Signed)
Unclear if this is related to postnasal drip. Unclear trigger. She also has hives as described above. Reasonable trial of Claritin to see if this makes a difference in either.

## 2017-04-10 NOTE — Assessment & Plan Note (Signed)
Treated with inc in fiber and prn miralax.  No ADE on med.  Continue as is.

## 2017-04-24 DIAGNOSIS — D2261 Melanocytic nevi of right upper limb, including shoulder: Secondary | ICD-10-CM | POA: Diagnosis not present

## 2017-04-24 DIAGNOSIS — L821 Other seborrheic keratosis: Secondary | ICD-10-CM | POA: Diagnosis not present

## 2017-04-24 DIAGNOSIS — D225 Melanocytic nevi of trunk: Secondary | ICD-10-CM | POA: Diagnosis not present

## 2017-04-24 DIAGNOSIS — Z85828 Personal history of other malignant neoplasm of skin: Secondary | ICD-10-CM | POA: Diagnosis not present

## 2017-04-24 DIAGNOSIS — X32XXXA Exposure to sunlight, initial encounter: Secondary | ICD-10-CM | POA: Diagnosis not present

## 2017-04-24 DIAGNOSIS — L570 Actinic keratosis: Secondary | ICD-10-CM | POA: Diagnosis not present

## 2017-05-02 ENCOUNTER — Other Ambulatory Visit: Payer: Self-pay

## 2017-05-02 ENCOUNTER — Ambulatory Visit: Payer: Medicare Other | Attending: Family Medicine | Admitting: Physical Therapy

## 2017-05-02 ENCOUNTER — Encounter: Payer: Self-pay | Admitting: Physical Therapy

## 2017-05-02 VITALS — BP 141/77 | HR 63

## 2017-05-02 DIAGNOSIS — R2689 Other abnormalities of gait and mobility: Secondary | ICD-10-CM | POA: Diagnosis not present

## 2017-05-02 DIAGNOSIS — R2681 Unsteadiness on feet: Secondary | ICD-10-CM | POA: Insufficient documentation

## 2017-05-02 DIAGNOSIS — M6281 Muscle weakness (generalized): Secondary | ICD-10-CM | POA: Diagnosis not present

## 2017-05-02 NOTE — Progress Notes (Signed)
Agree, thanks. Proceed.   GSD.  05/02/17

## 2017-05-02 NOTE — Therapy (Addendum)
[place] MAIN [facility] SERVICES [address] [place], [place], [address] Phone: [phone]   Fax:  [phone]  Physical Therapy Evaluation  Patient Details  Name: [name] MRN: [id number] Date of Birth: 1927/08/26 Referring Provider: Elsie Stain, MD   Encounter Date: 05/02/2017  PT End of Session - 05/02/17 1224    Visit Number  1    Number of Visits  13    Date for PT Re-Evaluation  06/13/17    Authorization Type  G codes    Authorization Time Period  1/10    PT Start Time  0844    PT Stop Time  0951    PT Time Calculation (min)  67 min    Equipment Utilized During Treatment  Gait belt    Activity Tolerance  Patient tolerated treatment well    Behavior During Therapy  [facility] for tasks assessed/performed       Past Medical History:  Diagnosis Date  . Allergy   . Arthritis   . CAD (coronary artery disease)   . Fatty liver    on u/s 2010  . Heart murmur   . Hyperlipidemia   . Hypertension   . Insomnia   . Myocardial infarction ([place])   . Osteoporosis    dxa 2013  . PAF (paroxysmal atrial fibrillation) ([place])   . SSS (sick sinus syndrome) ([place])    St. Jude Permanent Pacemaker 09/23/06  . Symptomatic bradycardia     Past Surgical History:  Procedure Laterality Date  . BACK SURGERY    . CARPAL TUNNEL RELEASE     left  . CHOLECYSTECTOMY    . CORONARY ANGIOPLASTY WITH STENT PLACEMENT  12/03/06   Stent to distal RCA  . CORONARY ANGIOPLASTY WITH STENT PLACEMENT  09/12/06   Stenting mid distal segment & PTCA alone distal RCA prox. to the posterior descending artery takeoff.  . CORONARY STENT PLACEMENT     RCA stent replaced   . FOOT SURGERY     Left  . HERNIA REPAIR     R inguinal  . PACEMAKER INSERTION  09/23/06   St.Jude  . US ECHOCARDIOGRAPHY  11/26/11   prox. septal thickening, mild concentric LVH, mod. ca+ AOV,trace AI,mild to mod valvular aortic stenosis.    Vitals:   05/02/17 0853  BP: (!) 141/77  Pulse: 63   SpO2: 99%     Subjective Assessment - 05/02/17 0903    Subjective  Imbalance    Patient is accompained by:  Family member Daughter, [name]   Daughter, [name]   Pertinent History  Pt is a 83 y/o F who presents due to instability while walking.  She is a poor historian so some imformation provided by her daughter, [name].  Pt reports she has been stumbling when she gets going too fast.  She denies any falls in the past 6 months.  Pt is not using AD.  She has been thinking about starting to use a cane but has not done so yet.  Daughter reports when the pt fatigues she tends to lean forward more and shuffles her feet more, making her unsteady.  Pt likes to wear slip on shoes but daughter has been pushing the patient to wear tennis shoes.  Pt does have a h/o back pain which is mostly due to fatigue after physical activities such as weed eating. Pt is active doing yard work, house activities.  Daughter would like pt to be more active in the community and has  suggested going to the senior center but pt has so far not been interested.  Pt can no longer drive due to diagnosis of Alzheimer's with vascular dementia.  Pt reports having difficulty swallowing.  Her daughter reports she is holding it in her mouth, takes a drink, and has difficulty swallowing.  Daughter believes this happens more when she drinks rather than when she eats.  This has been going on for several years and only her PCP is aware.  Suggestion was made for a speech evaluation and the pt and daughter would like to think about it.     Limitations  Standing;Walking;House hold activities    How long can you sit comfortably?  unlimited    How long can you stand comfortably?  pt unable to answer    How long can you walk comfortably?  10-15 minutes before she needs to lean on something    Patient Stated Goals  to improve her walking and her stability.  To determine if she should be using a cane.     Currently in Pain?  No/denies    Multiple Pain Sites   No         [facility] PT Assessment - 05/02/17 0911      Assessment   Medical Diagnosis  Gait Abnormality    Referring Provider  Elsie Stain, MD    Onset Date/Surgical Date  -- Gradually progessing over the past several years   Gradually progessing over the past several years   Hand Dominance  Right    Next MD Visit  11/15 meeting with Dr. Manuella Ghazi to follow up about Alzheimer's medication    Prior Therapy  No      Precautions   Precautions  Fall      Restrictions   Weight Bearing Restrictions  No      Balance Screen   Has the patient fallen in the past 6 months  No      [place] residence    Living Arrangements  Alone    Available Help at Discharge  Family;Available PRN/intermittently    Type of Home  House    Home Access  Stairs to enter    Entrance Stairs-Number of Steps  5    Entrance Stairs-Rails  Right;Left    Home Layout  Two level    Alternate Level Stairs-Number of Steps  13    Alternate Level Stairs-Rails  Left;Right    Home Equipment  Other (comment);Bedside commode wooden cane   wooden cane     Prior Function   Level of Independence  Independent    Vocation  Retired      Associate Professor   Overall Cognitive Status  History of cognitive impairments - at baseline h/o Alzheimer's with vascular dementia   h/o Alzheimer's with vascular dementia     ROM / Strength   AROM / PROM / Strength  Strength      Strength   Overall Strength  Deficits    Strength Assessment Site  Elbow;Shoulder;Hip;Knee;Ankle    Right/Left Shoulder  Right;Left    Right Shoulder Flexion  5/5    Right Shoulder ABduction  5/5    Left Shoulder Flexion  5/5    Left Shoulder ABduction  5/5    Right/Left Elbow  Right;Left    Right Elbow Flexion  4/5    Right Elbow Extension  4/5    Left Elbow Flexion  4/5    Left Elbow Extension  4/5  Right/Left Hip  Right;Left    Right Hip Flexion  4-/5    Right Hip ABduction  4-/5    Right Hip ADduction  5/5    Left  Hip Flexion  4/5    Left Hip ABduction  4/5    Left Hip ADduction  5/5    Right/Left Knee  Right;Left    Right Knee Flexion  5/5    Right Knee Extension  5/5    Left Knee Flexion  5/5    Left Knee Extension  5/5    Right/Left Ankle  Right;Left    Right Ankle Dorsiflexion  5/5    Left Ankle Dorsiflexion  5/5      Balance   Balance Assessed  Yes      Standardized Balance Assessment   Standardized Balance Assessment  Berg Balance Test      Berg Balance Test   Sit to Stand  Able to stand  independently using hands    Standing Unsupported  Able to stand safely 2 minutes    Sitting with Back Unsupported but Feet Supported on Floor or Stool  Able to sit safely and securely 2 minutes    Stand to Sit  Sits safely with minimal use of hands    Transfers  Able to transfer safely, minor use of hands    Standing Unsupported with Eyes Closed  Able to stand 10 seconds safely    Standing Ubsupported with Feet Together  Able to place feet together independently and stand 1 minute safely    From Standing, Reach Forward with Outstretched Arm  Can reach forward >12 cm safely (5")    From Standing Position, Pick up Object from Floor  Able to pick up shoe safely and easily    From Standing Position, Turn to Look Behind Over each Shoulder  Looks behind from both sides and weight shifts well    Turn 360 Degrees  Able to turn 360 degrees safely in 4 seconds or less    Standing Unsupported, Alternately Place Feet on Step/Stool  Able to stand independently and safely and complete 8 steps in 20 seconds    Standing Unsupported, One Foot in Front  Needs help to step but can hold 15 seconds    Standing on One Leg  Tries to lift leg/unable to hold 3 seconds but remains standing independently    Total Score  48        EXAMINATION   Outcome measures were completed and results explained to the patient:  5xSTS: 27.13 seconds, pt pushes on knees to rise to standing  Berg: 48/56  40mWT: 885 ft  ABC scale:  50%   Posture: No significant abnormalities noted.   Gait Analysis: L trendelenberg and pt shuffles feet with each step with no attempt to correct. Dec Bil hip E, dec Bil hip flexion, dec trunk rotation and arm swing.         Objective measurements completed on examination: See above findings.    TREATMENT  Tandem stance 2x30 seconds each foot (added to HEP)  Alternating toe taps to 8" step x20 each LE (added to HEP)      PT Education - 05/02/17 0954    Education provided  Yes    Education Details  Role of PT, POC, HEP, exercise technique    Person(s) Educated  Patient;Child(ren) Daughter, [name]   Daughter, [name]   Methods  Explanation;Demonstration;Verbal cues;Handout    Comprehension  Verbalized understanding;Returned demonstration;Need further instruction  PT Short Term Goals - 05/02/17 1233      PT SHORT TERM GOAL #1   Title  Pt will be compliant with HEP for carryover between sessions    Time  2    Period  Weeks    Status  New        PT Long Term Goals - 05/02/17 1232      PT LONG TERM GOAL #1   Title  Pt will improve her 5xSTS time to at least 13 seconds to demonstrate improved BLE strength and balance    Baseline  27.13 seconds and pt pushes on knees to stand    Time  4    Period  Weeks    Status  New      PT LONG TERM GOAL #2   Title  Pt will improve her 18mWT distance to at least 1,100 ft to demonstrate improved ambulatory endurance and speed    Baseline  885 ft    Time  6    Period  Weeks    Status  New      PT LONG TERM GOAL #3   Title  Pt will improve her Berg Balance Test score to at least 54/56 to demonstrate improved balance    Baseline  48/56    Time  4    Period  Weeks    Status  New             Plan - 05/02/17 1225    Clinical Impression Statement  Pt is a 83 y/o F who presents with goal to improve her stability while ambulating.  She presents with mild cognitive impairment due to h/o Alzheimer's with vascular dementia  and her daughter, [name], assists with providing information.  The pt demonstrates poor ambulatory endurance and speed as evidenced by the 90mWT and the 60mWT.  She demonstrates gait abnormalities as a result of BLE weaknes, notably in Bil hip abductors and hip flexors.  Her 5xSTS time indicates the pt has some BLE weakness and instability.  The pt will benefit from skilled PT interventions for improved gait mechanics, strength, and functional mobility.  Will ask pt and daughter about potential referral to SLP for a swallowing evaluation as pt and daughter report that they would like to this about this for now.    History and Personal Factors relevant to plan of care:  (+) pt is self motivated to improve her stability with ambulation (-) pt with mild cognitive deficits due to Alzheimer's, pt is a poor historian and has a difficult time expressing her goals and desires from PT    Clinical Presentation  Stable    Clinical Presentation due to:  Pt's instability has had a gradual progressive onset which is stable.    Clinical Decision Making  Low    Rehab Potential  Good    PT Frequency  2x / week    PT Duration  6 weeks    PT Treatment/Interventions  ADLs/Self Care Home Management;Aquatic Therapy;Cryotherapy;Electrical Stimulation;Iontophoresis 4mg /ml Dexamethasone;Moist Heat;Traction;Ultrasound;Contrast Bath;DME Instruction;Gait training;Stair training;Functional mobility training;Therapeutic activities;Therapeutic exercise;Balance training;Neuromuscular re-education;Cognitive remediation;Patient/family education;Orthotic Fit/Training;Manual techniques;Passive range of motion;Dry needling;Energy conservation;Taping    PT Next Visit Plan  follow up about potential SLP referral, review HEP and progress as appropriate, complete Berg Balance Test, balance, strength, and gait training    PT Home Exercise Plan  tandem stance, alternating toe taps    Recommended Other Services  Consider SLP referral at next visit     Consulted and Agree  with Plan of Care  Patient;Family member/caregiver    Family Member Consulted  Daughter and [place], [name]       Patient will benefit from skilled therapeutic intervention in order to improve the following deficits and impairments:  Abnormal gait, Decreased activity tolerance, Decreased balance, Decreased cognition, Decreased endurance, Decreased knowledge of use of DME, Decreased mobility, Decreased safety awareness, Decreased strength, Difficulty walking, Impaired perceived functional ability, Impaired flexibility, Impaired UE functional use, Improper body mechanics  Visit Diagnosis: Unsteadiness on feet  Muscle weakness (generalized)  Other abnormalities of gait and mobility  G-Codes - 24-May-2017 1235    Functional Assessment Tool Used (Outpatient Only)  Berg Balance Test, 5xSTS, 48mWT, clinical judgement, gait assessment    Functional Limitation  Mobility: Walking and moving around    Mobility: Walking and Moving Around Current Status [phone])  At least 20 percent but less than 40 percent impaired, limited or restricted    Mobility: Walking and Moving Around Goal Status [phone])  At least 1 percent but less than 20 percent impaired, limited or restricted        Problem List Patient Active Problem List   Diagnosis Date Noted  . Gait abnormality 04/10/2017  . Taste disorder 04/10/2017  . Hives 04/10/2017  . Constipation 04/10/2017  . Healthcare maintenance 03/19/2016  . Memory loss 12/11/2015  . SSS (sick sinus syndrome) ([place]) 05/17/2015  . Chest pain, rule out acute myocardial infarction 02/06/2015  . Hypothyroidism 11/02/2014  . PAF- none documented in years, not on anticoagulation 10/27/2014  . Troponin level elevated- 0.10 post pacemaker 10/27/2014  . Diaphragmatic stimulation by pacemaker 10/26/2014  . Fall at home 07/14/2014  . Bruit of right carotid artery 01/26/2014  . Medicare annual wellness visit, subsequent 12/22/2013  . Advance care planning  12/22/2013  . Mixed hyperlipidemia 07/29/2013  . Fatty liver 06/04/2012  . Loose stools 02/01/2012  . Murmur, cardiac 11/28/2011  . Back pain 08/29/2011  . Osteoporosis 06/26/2011  . Vertigo 03/05/2011  . Neck pain 03/05/2011  . Rash 11/16/2010  . Essential hypertension, benign 11/16/2010  . CAD S/P PCI 2008 11/16/2010  . Pacemaker - dual chamber St. Jude gen change 2016 11/16/2010  . Tinnitus of both ears 11/16/2010  . Insomnia 11/16/2010    Collie Siad PT, DPT 05-24-2017, 12:37 PM  [place] MAIN [facility] SERVICES [address] [place], [place], [address] Phone: [phone]   Fax:  [phone]  Name: [name] MRN: [id number] Date of Birth: Nov 06, 1927

## 2017-05-06 ENCOUNTER — Ambulatory Visit: Payer: Medicare Other

## 2017-05-08 ENCOUNTER — Ambulatory Visit: Payer: Medicare Other | Admitting: Physical Therapy

## 2017-05-08 ENCOUNTER — Encounter: Payer: Self-pay | Admitting: Physical Therapy

## 2017-05-08 DIAGNOSIS — M6281 Muscle weakness (generalized): Secondary | ICD-10-CM | POA: Diagnosis not present

## 2017-05-08 DIAGNOSIS — R2689 Other abnormalities of gait and mobility: Secondary | ICD-10-CM

## 2017-05-08 DIAGNOSIS — R2681 Unsteadiness on feet: Secondary | ICD-10-CM

## 2017-05-08 DIAGNOSIS — F0280 Dementia in other diseases classified elsewhere without behavioral disturbance: Secondary | ICD-10-CM | POA: Diagnosis not present

## 2017-05-08 DIAGNOSIS — G309 Alzheimer's disease, unspecified: Secondary | ICD-10-CM | POA: Diagnosis not present

## 2017-05-08 DIAGNOSIS — F0150 Vascular dementia without behavioral disturbance: Secondary | ICD-10-CM | POA: Diagnosis not present

## 2017-05-08 NOTE — Patient Instructions (Signed)
ABDUCTION: Sitting - Exercise Ball: Resistance Band (Active)   Sit with feet flat in a regular chair. With band tied around both legs, Lift right leg slightly and, against resistance band, draw it out to side. Complete __2_ sets of __15_ repetitions. Perform _2__ sessions per day.  Copyright  VHI. All rights reserved.  FLEXION: Sitting - Resistance Band (Active)   Sit, both feet flat. Have band tied around both legs above knees, lift right knee toward ceiling.Repeat with other knee Complete _2__ sets of _15__ repetitions. Perform _2__ sessions per day.  [email or web address].[email or web address]   Knee Extension: Resisted (Sitting)   With band looped around right ankle and under other foot, straighten leg with ankle loop. Keep other leg bent to increase resistance. Repeat _10___ times per set. Do __2__ sets per session. Do _2___ sessions per day.  http://orth.exer.us/691   Copyright  VHI. All rights reserved.   Copyright  VHI. All rights reserved.  Copyright  VHI. All rights reserved.  FLEXION: Sitting - Resistance Band (Active)   Sit with right foot flat. Have band tied around both feet, bend ankle, bringing toes toward head. Complete __2_ sets of __10_ repetitions. Perform _2__ sessions per day.   HIP / KNEE: Extension - Sit to Stand   Sitting, lean chest forward, raise hips up from surface. Straighten hips and knees. Weight bear equally on left and right sides. Backs of legs should not push off surface. __10_ reps per set, __2_ sets per day, _5__ days per week Use assistive device as needed.

## 2017-05-08 NOTE — Therapy (Signed)
[place] MAIN [facility] SERVICES [address] [place], [place], [address] Phone: [phone]   Fax:  [phone]  Physical Therapy Treatment  Patient Details  Name: [name] MRN: [id number] Date of Birth: [birth year] Referring Provider: Elsie Stain, MD   Encounter Date: 05/08/2017  PT End of Session - 05/08/17 1116    Visit Number  2    Number of Visits  13    Date for PT Re-Evaluation  06/13/17    Authorization Type  G codes    Authorization Time Period  2/10    PT Start Time  1100    PT Stop Time  1145    PT Time Calculation (min)  45 min    Equipment Utilized During Treatment  Gait belt    Activity Tolerance  Patient tolerated treatment well    Behavior During Therapy  [facility] for tasks assessed/performed       Past Medical History:  Diagnosis Date  . Allergy   . Arthritis   . CAD (coronary artery disease)   . Fatty liver    on u/s 2010  . Heart murmur   . Hyperlipidemia   . Hypertension   . Insomnia   . Myocardial infarction ([place])   . Osteoporosis    dxa 2013  . PAF (paroxysmal atrial fibrillation) ([place])   . SSS (sick sinus syndrome) ([place])    St. Jude Permanent Pacemaker 09/23/06  . Symptomatic bradycardia     Past Surgical History:  Procedure Laterality Date  . BACK SURGERY    . CARPAL TUNNEL RELEASE     left  . CHOLECYSTECTOMY    . CORONARY ANGIOPLASTY WITH STENT PLACEMENT  12/03/06   Stent to distal RCA  . CORONARY ANGIOPLASTY WITH STENT PLACEMENT  09/12/06   Stenting mid distal segment & PTCA alone distal RCA prox. to the posterior descending artery takeoff.  . CORONARY STENT PLACEMENT     RCA stent replaced   . FOOT SURGERY     Left  . HERNIA REPAIR     R inguinal  . PACEMAKER INSERTION  09/23/06   St.Jude  . US ECHOCARDIOGRAPHY  11/26/11   prox. septal thickening, mild concentric LVH, mod. ca+ AOV,trace AI,mild to mod valvular aortic stenosis.    There were no vitals filed for this visit.  Subjective  Assessment - 05/08/17 1113    Subjective  Patient reports having a little soreness in her low back; Reports compliance with balance exercises 2x a day; Denies any new falls;     Patient is accompained by:  Family member Daughter, [name]    Pertinent History  Pt is a 83 y/o F who presents due to instability while walking.  She is a poor historian so some imformation provided by her daughter, [name].  Pt reports she has been stumbling when she gets going too fast.  She denies any falls in the past 6 months.  Pt is not using AD.  She has been thinking about starting to use a cane but has not done so yet.  Daughter reports when the pt fatigues she tends to lean forward more and shuffles her feet more, making her unsteady.  Pt likes to wear slip on shoes but daughter has been pushing the patient to wear tennis shoes.  Pt does have a h/o back pain which is mostly due to fatigue after physical activities such as weed eating. Pt is active doing yard work, house activities.  Daughter would like pt  to be more active in the community and has suggested going to the senior center but pt has so far not been interested.  Pt can no longer drive due to diagnosis of Alzheimer's with vascular dementia.  Pt reports having difficulty swallowing.  Her daughter reports she is holding it in her mouth, takes a drink, and has difficulty swallowing.  Daughter believes this happens more when she drinks rather than when she eats.  This has been going on for several years and only her PCP is aware.  Suggestion was made for a speech evaluation and the pt and daughter would like to think about it.     Limitations  Standing;Walking;House hold activities    How long can you sit comfortably?  unlimited    How long can you stand comfortably?  pt unable to answer    How long can you walk comfortably?  10-15 minutes before she needs to lean on something    Patient Stated Goals  to improve her walking and her stability.  To determine if she should be  using a cane.     Currently in Pain?  Yes    Pain Score  3     Pain Location  Back    Pain Orientation  Lower    Pain Descriptors / Indicators  Aching;Sore    Pain Type  Chronic pain    Pain Onset  More than a month ago    Pain Frequency  Intermittent    Aggravating Factors   worse with prolonged activity    Pain Relieving Factors  rest    Effect of Pain on Daily Activities  decreased activity tolerance;     Multiple Pain Sites  No         TREATMENT: Balance: Standing on 1/2 bolster (flat side up) Heel/toe rock with B rail assist x15 with cues to keep knees straight for better ankle stretch; Feet apart, keeping ankles in neutral position, BUE wand flexion x10 reps with min A for safety and cues to improve weight shift for better ankle strategies; Tandem stance on 1/2 bolster with 2-0 rail assist x10 sec hold x3 each foot in front of the other; Patient required min VCs for balance stability, including to increase trunk control for less loss of balance with smaller base of support  Side stepping over 1/2 bolster with red tband around BLE ankles x10 each direction with cues to increase step length for better hip strengthening;   Instructed patient in sitting exercise for LE strengthening: Red tband around BLE: Hip flexion march x15 bilaterally Hip abduction x15 bilaterally; LAQ x10 bilaterally; Ankle DF x10 bilaterally; Patient required min-moderate verbal/tactile cues for correct exercise technique including cues to increase ROM for better strengthening;  Sit<>Stand from regular seat without HHA x10 reps with cues for forward weight shift for better transfer ability;  Balance: Standing on airex: Heel/toe raises x10 Modified tandem stance with BUE ball pass side/side x10 each foot in front; Feet together with BUE bal toss x10 reps;  Feet together with head turns side/side, up/down x10 each; Patient required min VCs for balance stability, including to increase trunk control  for less loss of balance with smaller base of support She required min A for safety and cues to improve core stabilization for less lateral loss of balance;                      PT Education - 05/08/17 1116    Education provided  Yes    Education Details  LE strengthening, balance exercise, HEP advanced;     Person(s) Educated  Patient;Child(ren)    Methods  Explanation;Demonstration;Verbal cues;Handout    Comprehension  Verbalized understanding;Returned demonstration;Verbal cues required;Need further instruction       PT Short Term Goals - 05/02/17 1233      PT SHORT TERM GOAL #1   Title  Pt will be compliant with HEP for carryover between sessions    Time  2    Period  Weeks    Status  New        PT Long Term Goals - 05/02/17 1232      PT LONG TERM GOAL #1   Title  Pt will improve her 5xSTS time to at least 13 seconds to demonstrate improved BLE strength and balance    Baseline  27.13 seconds and pt pushes on knees to stand    Time  4    Period  Weeks    Status  New      PT LONG TERM GOAL #2   Title  Pt will improve her 35mWT distance to at least 1,100 ft to demonstrate improved ambulatory endurance and speed    Baseline  885 ft    Time  6    Period  Weeks    Status  New      PT LONG TERM GOAL #3   Title  Pt will improve her Berg Balance Test score to at least 54/56 to demonstrate improved balance    Baseline  48/56    Time  4    Period  Weeks    Status  New            Plan - 05/08/17 1142    Clinical Impression Statement  Patient instructed in advanced LE strengthening and balance exercise. Advanced HEP with LE strengthening to improve hip control. Patient instructed in advanced balance exercise on compliant surfaces. she required cues for better trunk control and core stabilization for less loss of balance. She does report fatigue at end of session and some back pain but states that her back pain is not worse than when she came in. she would  benefit from additional skilled PT intervention to improve strength, balance and gait safety;     Rehab Potential  Good    PT Frequency  2x / week    PT Duration  6 weeks    PT Treatment/Interventions  ADLs/Self Care Home Management;Aquatic Therapy;Cryotherapy;Electrical Stimulation;Iontophoresis 4mg /ml Dexamethasone;Moist Heat;Traction;Ultrasound;Contrast Bath;DME Instruction;Gait training;Stair training;Functional mobility training;Therapeutic activities;Therapeutic exercise;Balance training;Neuromuscular re-education;Cognitive remediation;Patient/family education;Orthotic Fit/Training;Manual techniques;Passive range of motion;Dry needling;Energy conservation;Taping    PT Next Visit Plan  follow up about potential SLP referral, review HEP and progress as appropriate, complete Berg Balance Test, balance, strength, and gait training    PT Home Exercise Plan  tandem stance, alternating toe taps    Consulted and Agree with Plan of Care  Patient;Family member/caregiver    Family Member Consulted  Daughter and [place], [name]       Patient will benefit from skilled therapeutic intervention in order to improve the following deficits and impairments:  Abnormal gait, Decreased activity tolerance, Decreased balance, Decreased cognition, Decreased endurance, Decreased knowledge of use of DME, Decreased mobility, Decreased safety awareness, Decreased strength, Difficulty walking, Impaired perceived functional ability, Impaired flexibility, Impaired UE functional use, Improper body mechanics  Visit Diagnosis: Unsteadiness on feet  Muscle weakness (generalized)  Other abnormalities of gait and mobility     Problem List  Patient Active Problem List   Diagnosis Date Noted  . Gait abnormality 04/10/2017  . Taste disorder 04/10/2017  . Hives 04/10/2017  . Constipation 04/10/2017  . Healthcare maintenance 03/19/2016  . Memory loss 12/11/2015  . SSS (sick sinus syndrome) ([place]) 05/17/2015  . Chest pain,  rule out acute myocardial infarction 02/06/2015  . Hypothyroidism 11/02/2014  . PAF- none documented in years, not on anticoagulation 10/27/2014  . Troponin level elevated- 0.10 post pacemaker 10/27/2014  . Diaphragmatic stimulation by pacemaker 10/26/2014  . Fall at home 07/14/2014  . Bruit of right carotid artery 01/26/2014  . Medicare annual wellness visit, subsequent 12/22/2013  . Advance care planning 12/22/2013  . Mixed hyperlipidemia 07/29/2013  . Fatty liver 06/04/2012  . Loose stools 02/01/2012  . Murmur, cardiac 11/28/2011  . Back pain 08/29/2011  . Osteoporosis 06/26/2011  . Vertigo 03/05/2011  . Neck pain 03/05/2011  . Rash 11/16/2010  . Essential hypertension, benign 11/16/2010  . CAD S/P PCI 2008 11/16/2010  . Pacemaker - dual chamber St. Jude gen change 2016 11/16/2010  . Tinnitus of both ears 11/16/2010  . Insomnia 11/16/2010    Courtland Coppa PT, DPT 05/08/2017, 11:44 AM  [place] MAIN [facility] SERVICES [address] [place], [place], [address] Phone: [phone]   Fax:  [phone]  Name: [name] MRN: [id number] Date of Birth: Aug 29, 1927

## 2017-05-13 ENCOUNTER — Encounter: Payer: Self-pay | Admitting: Physical Therapy

## 2017-05-13 ENCOUNTER — Ambulatory Visit: Payer: Medicare Other | Admitting: Physical Therapy

## 2017-05-13 DIAGNOSIS — R2681 Unsteadiness on feet: Secondary | ICD-10-CM | POA: Diagnosis not present

## 2017-05-13 DIAGNOSIS — R2689 Other abnormalities of gait and mobility: Secondary | ICD-10-CM

## 2017-05-13 DIAGNOSIS — M6281 Muscle weakness (generalized): Secondary | ICD-10-CM | POA: Diagnosis not present

## 2017-05-13 NOTE — Therapy (Signed)
[place] MAIN [facility] SERVICES [address] [place], [place], [address] Phone: [phone]   Fax:  [phone]  Physical Therapy Treatment  Patient Details  Name: [name] MRN: [id number] Date of Birth: 12/20/1927 Referring Provider: Elsie Stain, MD   Encounter Date: 05/13/2017  PT End of Session - 05/13/17 0931    Visit Number  3    Number of Visits  13    Date for PT Re-Evaluation  06/13/17    Authorization Type  G codes    Authorization Time Period  3/10    PT Start Time  0928    PT Stop Time  1015    PT Time Calculation (min)  47 min    Equipment Utilized During Treatment  Gait belt    Activity Tolerance  Patient tolerated treatment well;No increased pain    Behavior During Therapy  WFL for tasks assessed/performed       Past Medical History:  Diagnosis Date  . Allergy   . Arthritis   . CAD (coronary artery disease)   . Fatty liver    on u/s 2010  . Heart murmur   . Hyperlipidemia   . Hypertension   . Insomnia   . Myocardial infarction ([place])   . Osteoporosis    dxa 2013  . PAF (paroxysmal atrial fibrillation) ([place])   . SSS (sick sinus syndrome) ([place])    St. Jude Permanent Pacemaker 09/23/06  . Symptomatic bradycardia     Past Surgical History:  Procedure Laterality Date  . BACK SURGERY    . CARPAL TUNNEL RELEASE     left  . CHOLECYSTECTOMY    . CORONARY ANGIOPLASTY WITH STENT PLACEMENT  12/03/06   Stent to distal RCA  . CORONARY ANGIOPLASTY WITH STENT PLACEMENT  09/12/06   Stenting mid distal segment & PTCA alone distal RCA prox. to the posterior descending artery takeoff.  . CORONARY STENT PLACEMENT     RCA stent replaced   . FOOT SURGERY     Left  . HERNIA REPAIR     R inguinal  . PACEMAKER INSERTION  09/23/06   St.Jude  . Pacemaker Revision- new RV Lead N/A 10/26/2014   Performed by Sanda Klein, MD at [facility]  . PPM Generator Changeout N/A 10/26/2014   Performed by Sanda Klein, MD at  [facility]  . US ECHOCARDIOGRAPHY  11/26/11   prox. septal thickening, mild concentric LVH, mod. ca+ AOV,trace AI,mild to mod valvular aortic stenosis.    There were no vitals filed for this visit.  Subjective Assessment - 05/13/17 0930    Subjective  Patient reports increased soreness in knees from doing HEP; She reports, "I have a cream that I put on them which does help" She denies any new falls;     Patient is accompained by:  Family member Daughter, [name]    Pertinent History  Pt is a 83 y/o F who presents due to instability while walking.  She is a poor historian so some imformation provided by her daughter, [name].  Pt reports she has been stumbling when she gets going too fast.  She denies any falls in the past 6 months.  Pt is not using AD.  She has been thinking about starting to use a cane but has not done so yet.  Daughter reports when the pt fatigues she tends to lean forward more and shuffles her feet more, making her unsteady.  Pt likes to wear  slip on shoes but daughter has been pushing the patient to wear tennis shoes.  Pt does have a h/o back pain which is mostly due to fatigue after physical activities such as weed eating. Pt is active doing yard work, house activities.  Daughter would like pt to be more active in the community and has suggested going to the senior center but pt has so far not been interested.  Pt can no longer drive due to diagnosis of Alzheimer's with vascular dementia.  Pt reports having difficulty swallowing.  Her daughter reports she is holding it in her mouth, takes a drink, and has difficulty swallowing.  Daughter believes this happens more when she drinks rather than when she eats.  This has been going on for several years and only her PCP is aware.  Suggestion was made for a speech evaluation and the pt and daughter would like to think about it.     Limitations  Standing;Walking;House hold activities    How long can you sit comfortably?  unlimited    How  long can you stand comfortably?  pt unable to answer    How long can you walk comfortably?  10-15 minutes before she needs to lean on something    Patient Stated Goals  to improve her walking and her stability.  To determine if she should be using a cane.     Currently in Pain?  Yes    Pain Score  3     Pain Location  Knee    Pain Orientation  Right;Left    Pain Descriptors / Indicators  Aching;Sore    Pain Type  Chronic pain    Pain Onset  More than a month ago    Pain Frequency  Intermittent    Aggravating Factors   worse with activity/transfers;     Pain Relieving Factors  rest/cream    Effect of Pain on Daily Activities  decreased activity tolerance;     Multiple Pain Sites  No          TREATMENT: Warm up on Nustep BUE/BLE level 2 x4 min (Unbilled);  Balance: Standing on airex beam:  Tandem walk x6 laps with 2-1 rail assist with min A for safety and cues to keep head up for better erect posture and better stance control; Side stepping on beam with 2-1 rail assist x3 laps each direction with close supervision and cues for erect posture/erect head; Tandem stance with BUE ball pass side/side x3 reps each direction with min A for safety x2 each foot in front with cues to increase core stabilization and improve push down through LE for better stance control;  Patient required min VCs for balance stability, including to increase trunk control for less loss of balance with smaller base of support   Standing on airex: Heel/toe raises x15 Modified tandem stance with BUE ball pass toss x10 each foot in front; Feet together with head turns side/side, up/down x10 each; Alternate toe taps to 4 inch step with 1-0 rail assist x15 bilaterally; Patient required min VCs for balance stability, including to increase trunk control for less loss of balance with smaller base of support She required min A for safety and cues to improve core stabilization for less lateral loss of balance;     Standing on rockerboard, forward/backward, side/side x2 min each with 2-1 rail assist with CGA for balance and cues to improve weight shift for better teeter; Patient reports increased pulling when going side/side due to weakness in  LEs.  Resisted walking, 12.5# forward/backward, side/side, x 2 way (x2 laps each) with min A for safety and cues to increase step length and slow down eccentric return for better balance;   4 square stepping, clockwise/counterclockise x4 laps each; multiple direction unsupported x2-3 min with cues to improve step length and to keep head/posture erect for better balance control;                  PT Education - 05/13/17 0931    Education provided  Yes    Education Details  LE strengthening, balance exercise, HEP reinforced;     Person(s) Educated  Patient;Child(ren)    Methods  Explanation;Demonstration;Verbal cues    Comprehension  Verbalized understanding;Returned demonstration;Verbal cues required;Need further instruction       PT Short Term Goals - 05/02/17 1233      PT SHORT TERM GOAL #1   Title  Pt will be compliant with HEP for carryover between sessions    Time  2    Period  Weeks    Status  New        PT Long Term Goals - 05/02/17 1232      PT LONG TERM GOAL #1   Title  Pt will improve her 5xSTS time to at least 13 seconds to demonstrate improved BLE strength and balance    Baseline  27.13 seconds and pt pushes on knees to stand    Time  4    Period  Weeks    Status  New      PT LONG TERM GOAL #2   Title  Pt will improve her 75mWT distance to at least 1,100 ft to demonstrate improved ambulatory endurance and speed    Baseline  885 ft    Time  6    Period  Weeks    Status  New      PT LONG TERM GOAL #3   Title  Pt will improve her Berg Balance Test score to at least 54/56 to demonstrate improved balance    Baseline  48/56    Time  4    Period  Weeks    Status  New            Plan - 05/13/17 1014     Clinical Impression Statement  Patient instructed in advanced balance exercise. She requires increased cues to improve erect posture and to keep head up for better balance. Patient reports feeling more balanced when looking down. She was able to reduce rail assist today which is improvement. Patient reports increased fatigue at end of session. she would benefit from additional skilled PT intervention to improve balance, strength and mobility; Recommended patient continue with HEP as her soreness is likely related to doing new exercise and should decrease with time.     Rehab Potential  Good    PT Frequency  2x / week    PT Duration  6 weeks    PT Treatment/Interventions  ADLs/Self Care Home Management;Aquatic Therapy;Cryotherapy;Electrical Stimulation;Iontophoresis 4mg /ml Dexamethasone;Moist Heat;Traction;Ultrasound;Contrast Bath;DME Instruction;Gait training;Stair training;Functional mobility training;Therapeutic activities;Therapeutic exercise;Balance training;Neuromuscular re-education;Cognitive remediation;Patient/family education;Orthotic Fit/Training;Manual techniques;Passive range of motion;Dry needling;Energy conservation;Taping    PT Next Visit Plan  follow up about potential SLP referral, review HEP and progress as appropriate, complete Berg Balance Test, balance, strength, and gait training    PT Home Exercise Plan  continue as given;     Consulted and Agree with Plan of Care  Patient;Family member/caregiver    Family Member Consulted  Daughter  and HCPOA, Tina       Patient will benefit from skilled therapeutic intervention in order to improve the following deficits and impairments:  Abnormal gait, Decreased activity tolerance, Decreased balance, Decreased cognition, Decreased endurance, Decreased knowledge of use of DME, Decreased mobility, Decreased safety awareness, Decreased strength, Difficulty walking, Impaired perceived functional ability, Impaired flexibility, Impaired UE functional  use, Improper body mechanics  Visit Diagnosis: Unsteadiness on feet  Muscle weakness (generalized)  Other abnormalities of gait and mobility     Problem List Patient Active Problem List   Diagnosis Date Noted  . Gait abnormality 04/10/2017  . Taste disorder 04/10/2017  . Hives 04/10/2017  . Constipation 04/10/2017  . Healthcare maintenance 03/19/2016  . Memory loss 12/11/2015  . SSS (sick sinus syndrome) ([place]) 05/17/2015  . Chest pain, rule out acute myocardial infarction 02/06/2015  . Hypothyroidism 11/02/2014  . PAF- none documented in years, not on anticoagulation 10/27/2014  . Troponin level elevated- 0.10 post pacemaker 10/27/2014  . Diaphragmatic stimulation by pacemaker 10/26/2014  . Fall at home 07/14/2014  . Bruit of right carotid artery 01/26/2014  . Medicare annual wellness visit, subsequent 12/22/2013  . Advance care planning 12/22/2013  . Mixed hyperlipidemia 07/29/2013  . Fatty liver 06/04/2012  . Loose stools 02/01/2012  . Murmur, cardiac 11/28/2011  . Back pain 08/29/2011  . Osteoporosis 06/26/2011  . Vertigo 03/05/2011  . Neck pain 03/05/2011  . Rash 11/16/2010  . Essential hypertension, benign 11/16/2010  . CAD S/P PCI 2008 11/16/2010  . Pacemaker - dual chamber St. Jude gen change 2016 11/16/2010  . Tinnitus of both ears 11/16/2010  . Insomnia 11/16/2010    Derelle Cockrell PT, DPT 05/13/2017, 10:18 AM  [place] MAIN [facility] SERVICES [address] [place], [place], [address] Phone: [phone]   Fax:  [phone]  Name: ELVIA AYDIN MRN: [id number] Date of Birth: 1927-07-11

## 2017-05-15 ENCOUNTER — Ambulatory Visit: Payer: Medicare Other | Admitting: Physical Therapy

## 2017-05-20 ENCOUNTER — Ambulatory Visit: Payer: Medicare Other

## 2017-05-23 ENCOUNTER — Ambulatory Visit: Payer: Medicare Other

## 2017-05-23 DIAGNOSIS — M6281 Muscle weakness (generalized): Secondary | ICD-10-CM | POA: Diagnosis not present

## 2017-05-23 DIAGNOSIS — R2681 Unsteadiness on feet: Secondary | ICD-10-CM

## 2017-05-23 DIAGNOSIS — R2689 Other abnormalities of gait and mobility: Secondary | ICD-10-CM

## 2017-05-23 NOTE — Therapy (Signed)
[place] MAIN [facility] SERVICES [address] [place], [place], [address] Phone: [phone]   Fax:  [phone]  Physical Therapy Treatment  Patient Details  Name: [name] MRN: [id number] Date of Birth: 1927/09/01 Referring Provider: Elsie Stain, MD   Encounter Date: 05/23/2017  PT End of Session - 05/23/17 0934    Visit Number  4    Number of Visits  13    Date for PT Re-Evaluation  06/13/17    Authorization Type  G codes    Authorization Time Period  4/10    PT Start Time  0930    PT Stop Time  1015    PT Time Calculation (min)  45 min    Equipment Utilized During Treatment  Gait belt    Activity Tolerance  Patient tolerated treatment well;No increased pain    Behavior During Therapy  WFL for tasks assessed/performed       Past Medical History:  Diagnosis Date  . Allergy   . Arthritis   . CAD (coronary artery disease)   . Fatty liver    on u/s 2010  . Heart murmur   . Hyperlipidemia   . Hypertension   . Insomnia   . Myocardial infarction ([place])   . Osteoporosis    dxa 2013  . PAF (paroxysmal atrial fibrillation) ([place])   . SSS (sick sinus syndrome) ([place])    St. Jude Permanent Pacemaker 09/23/06  . Symptomatic bradycardia     Past Surgical History:  Procedure Laterality Date  . BACK SURGERY    . CARPAL TUNNEL RELEASE     left  . CHOLECYSTECTOMY    . CORONARY ANGIOPLASTY WITH STENT PLACEMENT  12/03/06   Stent to distal RCA  . CORONARY ANGIOPLASTY WITH STENT PLACEMENT  09/12/06   Stenting mid distal segment & PTCA alone distal RCA prox. to the posterior descending artery takeoff.  . CORONARY STENT PLACEMENT     RCA stent replaced   . EP IMPLANTABLE DEVICE N/A 10/26/2014   Procedure: PPM Generator Changeout;  Surgeon: Sanda Klein, MD;  Location: [facility];  Service: Cardiovascular;  Laterality: N/A;  . EP IMPLANTABLE DEVICE N/A 10/26/2014   Procedure: Pacemaker Revision- new RV Lead;  Surgeon: Sanda Klein, MD;  Location: [facility];  Service: Cardiovascular;  Laterality: N/A;  . FOOT SURGERY     Left  . HERNIA REPAIR     R inguinal  . PACEMAKER INSERTION  09/23/06   St.Jude  . US ECHOCARDIOGRAPHY  11/26/11   prox. septal thickening, mild concentric LVH, mod. ca+ AOV,trace AI,mild to mod valvular aortic stenosis.    There were no vitals filed for this visit.  Subjective Assessment - 05/23/17 0933    Subjective  Patient has some back pain. Reports no new falls or stumbles since last session.     Patient is accompained by:  Family member Daughter, [name]    Pertinent History  Pt is a 83 y/o F who presents due to instability while walking.  She is a poor historian so some imformation provided by her daughter, [name].  Pt reports she has been stumbling when she gets going too fast.  She denies any falls in the past 6 months.  Pt is not using AD.  She has been thinking about starting to use a cane but has not done so yet.  Daughter reports when the pt fatigues she tends to lean forward more and shuffles her feet more,  making her unsteady.  Pt likes to wear slip on shoes but daughter has been pushing the patient to wear tennis shoes.  Pt does have a h/o back pain which is mostly due to fatigue after physical activities such as weed eating. Pt is active doing yard work, house activities.  Daughter would like pt to be more active in the community and has suggested going to the senior center but pt has so far not been interested.  Pt can no longer drive due to diagnosis of Alzheimer's with vascular dementia.  Pt reports having difficulty swallowing.  Her daughter reports she is holding it in her mouth, takes a drink, and has difficulty swallowing.  Daughter believes this happens more when she drinks rather than when she eats.  This has been going on for several years and only her PCP is aware.  Suggestion was made for a speech evaluation and the pt and daughter would like to think about it.      Limitations  Standing;Walking;House hold activities    How long can you sit comfortably?  unlimited    How long can you stand comfortably?  pt unable to answer    How long can you walk comfortably?  10-15 minutes before she needs to lean on something    Patient Stated Goals  to improve her walking and her stability.  To determine if she should be using a cane.     Currently in Pain?  Yes    Pain Score  4     Pain Location  Back    Pain Orientation  Lower    Pain Descriptors / Indicators  Aching    Pain Type  Chronic pain    Pain Onset  More than a month ago       TREATMENT: Warm up on Nustep BUE/BLE level 2 x4 min (Unbilled);   Neuro Re-ed Standing marches in // bars Airex pad:   Eyes closed  Head turns : vertical and horizontal  Marching 20x   Diona Foley toss with family member while PT holds   Walking in hallway passing ball up to self 200 ft with CGA, Min A to regain/retain COM. Frequent LOB and forward weight shift.  Walking in hallway reading playing cards on walls 200 ft , limited picking up of feet. Pt. Requires frequent sessions  Step over and back half foam roller 10x each leg 2x each leg, cues for not using UE assistance  Side step over and back half foam roller 10x each leg, 2x each leg, both knees started feeling sore/hurting at 10th step.    6" toe taps without UE support 20x each leg , cues for lifting hips and knees for improved flexion  Walking backwards in // bars CGA, cues for increased step length.    Patient required min VCs for balance stability, including to increase trunk control for less loss of balance with smaller base of support                PT Education - 05/23/17 0933    Education provided  Yes    Education Details  LE strengthening, balance, body mechanics    Person(s) Educated  Patient    Methods  Explanation;Demonstration;Verbal cues    Comprehension  Verbalized understanding;Returned demonstration       PT Short Term Goals -  05/02/17 1233      PT SHORT TERM GOAL #1   Title  Pt will be compliant with HEP for carryover between sessions  Time  2    Period  Weeks    Status  New        PT Long Term Goals - 05/02/17 1232      PT LONG TERM GOAL #1   Title  Pt will improve her 5xSTS time to at least 13 seconds to demonstrate improved BLE strength and balance    Baseline  27.13 seconds and pt pushes on knees to stand    Time  4    Period  Weeks    Status  New      PT LONG TERM GOAL #2   Title  Pt will improve her 3mWT distance to at least 1,100 ft to demonstrate improved ambulatory endurance and speed    Baseline  885 ft    Time  6    Period  Weeks    Status  New      PT LONG TERM GOAL #3   Title  Pt will improve her Berg Balance Test score to at least 54/56 to demonstrate improved balance    Baseline  48/56    Time  4    Period  Weeks    Status  New            Plan - 05/23/17 1016    Clinical Impression Statement  Patient require min A to retain COM when ambulating with dynamic balance elements. Pt. Continues to require frequent verbal cues for task orientation and sequencing due to poor short term memory. Interventions to increase hip and knee flexion for carryover to ambulatory mechanics performed with patient fatiguing quickly. Patient will continue to benefit from skilled physical therapy to improve strength, balance, and gait safety.     Rehab Potential  Good    PT Frequency  2x / week    PT Duration  6 weeks    PT Treatment/Interventions  ADLs/Self Care Home Management;Aquatic Therapy;Cryotherapy;Electrical Stimulation;Iontophoresis 4mg /ml Dexamethasone;Moist Heat;Traction;Ultrasound;Contrast Bath;DME Instruction;Gait training;Stair training;Functional mobility training;Therapeutic activities;Therapeutic exercise;Balance training;Neuromuscular re-education;Cognitive remediation;Patient/family education;Orthotic Fit/Training;Manual techniques;Passive range of motion;Dry needling;Energy  conservation;Taping    PT Next Visit Plan  follow up about potential SLP referral, review HEP and progress as appropriate, complete Berg Balance Test, balance, strength, and gait training    PT Home Exercise Plan  continue as given;     Consulted and Agree with Plan of Care  Patient;Family member/caregiver    Family Member Consulted  Daughter and Center, [name]       Patient will benefit from skilled therapeutic intervention in order to improve the following deficits and impairments:  Abnormal gait, Decreased activity tolerance, Decreased balance, Decreased cognition, Decreased endurance, Decreased knowledge of use of DME, Decreased mobility, Decreased safety awareness, Decreased strength, Difficulty walking, Impaired perceived functional ability, Impaired flexibility, Impaired UE functional use, Improper body mechanics  Visit Diagnosis: Unsteadiness on feet  Muscle weakness (generalized)  Other abnormalities of gait and mobility     Problem List Patient Active Problem List   Diagnosis Date Noted  . Gait abnormality 04/10/2017  . Taste disorder 04/10/2017  . Hives 04/10/2017  . Constipation 04/10/2017  . Healthcare maintenance 03/19/2016  . Memory loss 12/11/2015  . SSS (sick sinus syndrome) ([place]) 05/17/2015  . Chest pain, rule out acute myocardial infarction 02/06/2015  . Hypothyroidism 11/02/2014  . PAF- none documented in years, not on anticoagulation 10/27/2014  . Troponin level elevated- 0.10 post pacemaker 10/27/2014  . Diaphragmatic stimulation by pacemaker 10/26/2014  . Fall at home 07/14/2014  . Bruit of right carotid artery 01/26/2014  .  Medicare annual wellness visit, subsequent 12/22/2013  . Advance care planning 12/22/2013  . Mixed hyperlipidemia 07/29/2013  . Fatty liver 06/04/2012  . Loose stools 02/01/2012  . Murmur, cardiac 11/28/2011  . Back pain 08/29/2011  . Osteoporosis 06/26/2011  . Vertigo 03/05/2011  . Neck pain 03/05/2011  . Rash 11/16/2010  .  Essential hypertension, benign 11/16/2010  . CAD S/P PCI 2008 11/16/2010  . Pacemaker - dual chamber St. Jude gen change 2016 11/16/2010  . Tinnitus of both ears 11/16/2010  . Insomnia 11/16/2010   Janna Arch, PT, DPT   Janna Arch 05/23/2017, 10:17 AM  [place] MAIN [facility] SERVICES [address] [place], [place], [address] Phone: [phone]   Fax:  [phone]  Name: [name] MRN: [id number] Date of Birth: 08/12/1927

## 2017-05-27 ENCOUNTER — Ambulatory Visit: Payer: Medicare Other | Attending: Family Medicine

## 2017-05-27 DIAGNOSIS — R2689 Other abnormalities of gait and mobility: Secondary | ICD-10-CM | POA: Insufficient documentation

## 2017-05-27 DIAGNOSIS — M6281 Muscle weakness (generalized): Secondary | ICD-10-CM | POA: Insufficient documentation

## 2017-05-27 DIAGNOSIS — R2681 Unsteadiness on feet: Secondary | ICD-10-CM | POA: Diagnosis not present

## 2017-05-27 NOTE — Therapy (Signed)
[place] MAIN [facility] SERVICES [address] [place], [place], [address] Phone: [phone]   Fax:  [phone]  Physical Therapy Treatment  Patient Details  Name: [name] MRN: [id number] Date of Birth: 18-Nov-1927 Referring Provider: Elsie Stain, MD   Encounter Date: 05/27/2017  PT End of Session - 05/27/17 0911    Visit Number  5    Number of Visits  13    Date for PT Re-Evaluation  06/13/17    Authorization Type  G codes    Authorization Time Period  5/10    PT Start Time  0915    PT Stop Time  1000    PT Time Calculation (min)  45 min    Equipment Utilized During Treatment  Gait belt    Activity Tolerance  Patient tolerated treatment well;No increased pain    Behavior During Therapy  WFL for tasks assessed/performed       Past Medical History:  Diagnosis Date  . Allergy   . Arthritis   . CAD (coronary artery disease)   . Fatty liver    on u/s 2010  . Heart murmur   . Hyperlipidemia   . Hypertension   . Insomnia   . Myocardial infarction ([place])   . Osteoporosis    dxa 2013  . PAF (paroxysmal atrial fibrillation) ([place])   . SSS (sick sinus syndrome) ([place])    St. Jude Permanent Pacemaker 09/23/06  . Symptomatic bradycardia     Past Surgical History:  Procedure Laterality Date  . BACK SURGERY    . CARPAL TUNNEL RELEASE     left  . CHOLECYSTECTOMY    . CORONARY ANGIOPLASTY WITH STENT PLACEMENT  12/03/06   Stent to distal RCA  . CORONARY ANGIOPLASTY WITH STENT PLACEMENT  09/12/06   Stenting mid distal segment & PTCA alone distal RCA prox. to the posterior descending artery takeoff.  . CORONARY STENT PLACEMENT     RCA stent replaced   . EP IMPLANTABLE DEVICE N/A 10/26/2014   Procedure: PPM Generator Changeout;  Surgeon: Sanda Klein, MD;  Location: [facility];  Service: Cardiovascular;  Laterality: N/A;  . EP IMPLANTABLE DEVICE N/A 10/26/2014   Procedure: Pacemaker Revision- new RV Lead;  Surgeon: Sanda Klein, MD;  Location: [facility];  Service: Cardiovascular;  Laterality: N/A;  . FOOT SURGERY     Left  . HERNIA REPAIR     R inguinal  . PACEMAKER INSERTION  09/23/06   St.Jude  . US ECHOCARDIOGRAPHY  11/26/11   prox. septal thickening, mild concentric LVH, mod. ca+ AOV,trace AI,mild to mod valvular aortic stenosis.    There were no vitals filed for this visit.  Subjective Assessment - 05/27/17 0916    Subjective  Pt. had a fall Saturday when picking up twigs in the yard. She fell forward, got up by herself, and reports picking up more twigs again after fall.     Patient is accompained by:  Family member Daughter, [name]    Pertinent History  Pt is a 83 y/o F who presents due to instability while walking.  She is a poor historian so some imformation provided by her daughter, [name].  Pt reports she has been stumbling when she gets going too fast.  She denies any falls in the past 6 months.  Pt is not using AD.  She has been thinking about starting to use a cane but has not done so yet.  Daughter reports when  the pt fatigues she tends to lean forward more and shuffles her feet more, making her unsteady.  Pt likes to wear slip on shoes but daughter has been pushing the patient to wear tennis shoes.  Pt does have a h/o back pain which is mostly due to fatigue after physical activities such as weed eating. Pt is active doing yard work, house activities.  Daughter would like pt to be more active in the community and has suggested going to the senior center but pt has so far not been interested.  Pt can no longer drive due to diagnosis of Alzheimer's with vascular dementia.  Pt reports having difficulty swallowing.  Her daughter reports she is holding it in her mouth, takes a drink, and has difficulty swallowing.  Daughter believes this happens more when she drinks rather than when she eats.  This has been going on for several years and only her PCP is aware.  Suggestion was made for a speech  evaluation and the pt and daughter would like to think about it.     Limitations  Standing;Walking;House hold activities    How long can you sit comfortably?  unlimited    How long can you stand comfortably?  pt unable to answer    How long can you walk comfortably?  10-15 minutes before she needs to lean on something    Patient Stated Goals  to improve her walking and her stability.  To determine if she should be using a cane.     Currently in Pain?  No/denies       Treat Airex pad: 6 inch step toe taps 20x, occasional RUE pushing against bars for support.  Airex pad with 3000 G weighted ball overhead raises 2x12 Airex pad: eyes closed 3x 60 seconds, frequent posterior, anterior, and Right ward sway with correction to COM.  Squatting down to pick up cones in //bars , cues for squatting down, keeping chest up so can see stripes of sweater in mirror, opp hand on LE for control of decent and ascent- frequent practice throughout session to ensure carryover of sequencing    Ambulating in hallway reading cards left and right, cueing for upright chest to prevent forward LOB , increased forward shuffling with fatigue.   Step over and back hurdle 15x each leg. Occasional LOB, Side step over and back hurdle 15x each leg. Height drop when standing on RLE in SLS.   seated scapular retractions 15x for upright posture to decrease forward trunk lean   Tandem walk in // bars 6x, occasional tripping over self  Lunge onto blue bosu 10x each leg ; ankle stability challenged bilaterally.                    PT Education - 05/27/17 0911    Education provided  Yes    Education Details  LE strengthening and body mechanics for increased step height, proper body mechanics for picking up objects from floor    Person(s) Educated  Patient    Methods  Explanation;Demonstration    Comprehension  Need further instruction;Returned demonstration       PT Short Term Goals - 05/02/17 1233       PT SHORT TERM GOAL #1   Title  Pt will be compliant with HEP for carryover between sessions    Time  2    Period  Weeks    Status  New        PT Long Term Goals - 05/02/17  [place] #1   Title  Pt will improve her 5xSTS time to at least 13 seconds to demonstrate improved BLE strength and balance    Baseline  27.13 seconds and pt pushes on knees to stand    Time  4    Period  Weeks    Status  New      PT LONG TERM GOAL #2   Title  Pt will improve her 37mWT distance to at least 1,100 ft to demonstrate improved ambulatory endurance and speed    Baseline  885 ft    Time  6    Period  Weeks    Status  New      PT LONG TERM GOAL #3   Title  Pt will improve her Berg Balance Test score to at least 54/56 to demonstrate improved balance    Baseline  48/56    Time  4    Period  Weeks    Status  New            Plan - 05/27/17 1007    Clinical Impression Statement  Patient required frequent repetition of education on how to properly pick up objects from floor to decrease fall risk when performing tasks at home. Daughter and pt. Educated on safe body mechanics due to patient's unwillingness to use her reacher's and refusal to stop performing tasks that require her to pick up objects from floor despite her daughter requesting her not to. Continued education on utilizing proper body mechanics will benefit patient's safety at this time. Patient will continue to benefit from skilled physical therapy to improve strength, balance, and gait safety.     Rehab Potential  Good    PT Frequency  2x / week    PT Duration  6 weeks    PT Treatment/Interventions  ADLs/Self Care Home Management;Aquatic Therapy;Cryotherapy;Electrical Stimulation;Iontophoresis 4mg /ml Dexamethasone;Moist Heat;Traction;Ultrasound;Contrast Bath;DME Instruction;Gait training;Stair training;Functional mobility training;Therapeutic activities;Therapeutic exercise;Balance training;Neuromuscular  re-education;Cognitive remediation;Patient/family education;Orthotic Fit/Training;Manual techniques;Passive range of motion;Dry needling;Energy conservation;Taping    PT Next Visit Plan  follow up about potential SLP referral, review HEP and progress as appropriate, complete Berg Balance Test, balance, strength, and gait training    PT Home Exercise Plan  continue as given;     Consulted and Agree with Plan of Care  Patient;Family member/caregiver    Family Member Consulted  Daughter and [place], [name]       Patient will benefit from skilled therapeutic intervention in order to improve the following deficits and impairments:  Abnormal gait, Decreased activity tolerance, Decreased balance, Decreased cognition, Decreased endurance, Decreased knowledge of use of DME, Decreased mobility, Decreased safety awareness, Decreased strength, Difficulty walking, Impaired perceived functional ability, Impaired flexibility, Impaired UE functional use, Improper body mechanics  Visit Diagnosis: Unsteadiness on feet  Muscle weakness (generalized)  Other abnormalities of gait and mobility     Problem List Patient Active Problem List   Diagnosis Date Noted  . Gait abnormality 04/10/2017  . Taste disorder 04/10/2017  . Hives 04/10/2017  . Constipation 04/10/2017  . Healthcare maintenance 03/19/2016  . Memory loss 12/11/2015  . SSS (sick sinus syndrome) ([place]) 05/17/2015  . Chest pain, rule out acute myocardial infarction 02/06/2015  . Hypothyroidism 11/02/2014  . PAF- none documented in years, not on anticoagulation 10/27/2014  . Troponin level elevated- 0.10 post pacemaker 10/27/2014  . Diaphragmatic stimulation by pacemaker 10/26/2014  . Fall at home 07/14/2014  . Bruit of right carotid artery 01/26/2014  .  Medicare annual wellness visit, subsequent 12/22/2013  . Advance care planning 12/22/2013  . Mixed hyperlipidemia 07/29/2013  . Fatty liver 06/04/2012  . Loose stools 02/01/2012  . Murmur,  cardiac 11/28/2011  . Back pain 08/29/2011  . Osteoporosis 06/26/2011  . Vertigo 03/05/2011  . Neck pain 03/05/2011  . Rash 11/16/2010  . Essential hypertension, benign 11/16/2010  . CAD S/P PCI 2008 11/16/2010  . Pacemaker - dual chamber St. Jude gen change 2016 11/16/2010  . Tinnitus of both ears 11/16/2010  . Insomnia 11/16/2010   Janna Arch, PT, DPT   Janna Arch 05/27/2017, 10:08 AM  [place] MAIN [facility] SERVICES [address] [place], [place], [address] Phone: [phone]   Fax:  [phone]  Name: [name] MRN: [id number] Date of Birth: 05/04/1928

## 2017-05-30 ENCOUNTER — Ambulatory Visit: Payer: Medicare Other | Admitting: Physical Therapy

## 2017-05-30 ENCOUNTER — Encounter: Payer: Self-pay | Admitting: Physical Therapy

## 2017-05-30 ENCOUNTER — Telehealth: Payer: Self-pay

## 2017-05-30 DIAGNOSIS — M6281 Muscle weakness (generalized): Secondary | ICD-10-CM | POA: Diagnosis not present

## 2017-05-30 DIAGNOSIS — R2689 Other abnormalities of gait and mobility: Secondary | ICD-10-CM | POA: Diagnosis not present

## 2017-05-30 DIAGNOSIS — R2681 Unsteadiness on feet: Secondary | ICD-10-CM | POA: Diagnosis not present

## 2017-05-30 NOTE — Telephone Encounter (Signed)
Daughter called about pt.'s losartan. Instructed to call her pharmacist. Rosezena Sensor understanding.

## 2017-05-30 NOTE — Therapy (Signed)
[place] MAIN [facility] SERVICES [address] [place], [place], [address] Phone: [phone]   Fax:  [phone]  Physical Therapy Treatment  Patient Details  Name: [name] MRN: [id number] Date of Birth: [birth year] Referring Provider: Elsie Stain, MD   Encounter Date: 05/30/2017  PT End of Session - 05/30/17 0936    Visit Number  6    Number of Visits  13    Date for PT Re-Evaluation  06/13/17    Authorization Type  G codes    Authorization Time Period  6/10    PT Start Time  0930    PT Stop Time  1015    PT Time Calculation (min)  45 min    Equipment Utilized During Treatment  Gait belt    Activity Tolerance  Patient tolerated treatment well;No increased pain    Behavior During Therapy  WFL for tasks assessed/performed       Past Medical History:  Diagnosis Date  . Allergy   . Arthritis   . CAD (coronary artery disease)   . Fatty liver    on u/s 2010  . Heart murmur   . Hyperlipidemia   . Hypertension   . Insomnia   . Myocardial infarction ([place])   . Osteoporosis    dxa 2013  . PAF (paroxysmal atrial fibrillation) ([place])   . SSS (sick sinus syndrome) ([place])    St. Jude Permanent Pacemaker 09/23/06  . Symptomatic bradycardia     Past Surgical History:  Procedure Laterality Date  . BACK SURGERY    . CARPAL TUNNEL RELEASE     left  . CHOLECYSTECTOMY    . CORONARY ANGIOPLASTY WITH STENT PLACEMENT  12/03/06   Stent to distal RCA  . CORONARY ANGIOPLASTY WITH STENT PLACEMENT  09/12/06   Stenting mid distal segment & PTCA alone distal RCA prox. to the posterior descending artery takeoff.  . CORONARY STENT PLACEMENT     RCA stent replaced   . EP IMPLANTABLE DEVICE N/A 10/26/2014   Procedure: PPM Generator Changeout;  Surgeon: Sanda Klein, MD;  Location: [place] [facility];  Service: Cardiovascular;  Laterality: N/A;  . EP IMPLANTABLE DEVICE N/A 10/26/2014   Procedure: Pacemaker Revision- new RV Lead;  Surgeon: Sanda Klein, MD;  Location: [place] [facility];  Service: Cardiovascular;  Laterality: N/A;  . FOOT SURGERY     Left  . HERNIA REPAIR     R inguinal  . PACEMAKER INSERTION  09/23/06   St.Jude  . US ECHOCARDIOGRAPHY  11/26/11   prox. septal thickening, mild concentric LVH, mod. ca+ AOV,trace AI,mild to mod valvular aortic stenosis.    There were no vitals filed for this visit.  Subjective Assessment - 05/30/17 0935    Subjective  Patient is doing well today; reports no soreness today; she denies any new falls this week; She reports still doing exercises at home with some difficulty;     Patient is accompained by:  Family member Daughter, [name]    Pertinent History  Pt is a 83 y/o F who presents due to instability while walking.  She is a poor historian so some imformation provided by her daughter, [name].  Pt reports she has been stumbling when she gets going too fast.  She denies any falls in the past 6 months.  Pt is not using AD.  She has been thinking about starting to use a cane but has not done so yet.  Daughter reports when the pt  fatigues she tends to lean forward more and shuffles her feet more, making her unsteady.  Pt likes to wear slip on shoes but daughter has been pushing the patient to wear tennis shoes.  Pt does have a h/o back pain which is mostly due to fatigue after physical activities such as weed eating. Pt is active doing yard work, house activities.  Daughter would like pt to be more active in the community and has suggested going to the senior center but pt has so far not been interested.  Pt can no longer drive due to diagnosis of Alzheimer's with vascular dementia.  Pt reports having difficulty swallowing.  Her daughter reports she is holding it in her mouth, takes a drink, and has difficulty swallowing.  Daughter believes this happens more when she drinks rather than when she eats.  This has been going on for several years and only her PCP is aware.  Suggestion was made for a speech  evaluation and the pt and daughter would like to think about it.     Limitations  Walking;House hold activities    How long can you sit comfortably?  unlimited    How long can you stand comfortably?  pt unable to answer    How long can you walk comfortably?  10-15 minutes before she needs to lean on something    Patient Stated Goals  to improve her walking and her stability.  To determine if she should be using a cane.     Currently in Pain?  No/denies         TREATMENT: Warm up on Nustep BUE/BLE level 2 x4 min (Unbilled);  Balance: Standing on airex beam:  Tandem stance with 2- 0 rail assist, 10 sec hold x2 each foot in front; tandem stance without rail assist with head turns side/side, up/down, with visual cues for gaze stabilization x1 rep each foot in front; Pt required cues to increase core stabilization and improve push down through LE for better stance control;  Side stepping x3 laps each direction unsupported with CGA and cues to improve erect posture for better dynamic balance control;   Standing on airex: Modified tandem stance with BUE ball pass toss x5 each foot in front; She required min A for safety and cues to improve core stabilization for less lateral loss of balance;   Standing outside parallel bars: Forward/backward step over 1/2 bolster with 1-0 rail assist x10 reps each direction; Side step over 1/2 bolster with 0 rail assist x10 each direction Patient required close supervision and cues to increase step length for better foot clearance;  Sit<>Stand with yellow weighted ball overhead lift x10 reps with cues for forward weight shift for better transfer ability;  Resisted walking, 12.5# forward/backward, side/side, x 2 way (x2 laps each) with min A for safety and cues to increase step length and slow down eccentric return for better balance;  4 square stepping, clockwise/counterclockise x4 laps each; multiple direction unsupported x2-3 min with cues to  improve step length and to keep head/posture erect for better balance control; Patient got confused during multiple directional stepping requiring instruction for correct box; She also had difficulty with head in erect posture often wanting to look down for better balance;                       PT Education - 05/30/17 0936    Education provided  Yes    Education Details  LE strengthening, balance, HEP reinforced;  Person(s) Educated  Patient    Methods  Explanation;Demonstration;Verbal cues    Comprehension  Verbalized understanding;Returned demonstration;Verbal cues required;Need further instruction       PT Short Term Goals - 05/02/17 1233      PT SHORT TERM GOAL #1   Title  Pt will be compliant with HEP for carryover between sessions    Time  2    Period  Weeks    Status  New        PT Long Term Goals - 05/02/17 1232      PT LONG TERM GOAL #1   Title  Pt will improve her 5xSTS time to at least 13 seconds to demonstrate improved BLE strength and balance    Baseline  27.13 seconds and pt pushes on knees to stand    Time  4    Period  Weeks    Status  New      PT LONG TERM GOAL #2   Title  Pt will improve her 31mWT distance to at least 1,100 ft to demonstrate improved ambulatory endurance and speed    Baseline  885 ft    Time  6    Period  Weeks    Status  New      PT LONG TERM GOAL #3   Title  Pt will improve her Berg Balance Test score to at least 54/56 to demonstrate improved balance    Baseline  48/56    Time  4    Period  Weeks    Status  New            Plan - 05/30/17 1056    Clinical Impression Statement  Patient instructed in dynamic balance exercise reducing rail assist for increased balance challenge; She does require frequent cues to improve erect head/posture for better balance challenge and improved control; When patient does stand up straight she exhibits less loss of balance as compared to when looking at the floor. Patient  does report fatigue at end of session. She would benefit from additional skilled PT intervention to improve strength, balance and gait safety;     Rehab Potential  Good    PT Frequency  2x / week    PT Duration  6 weeks    PT Treatment/Interventions  ADLs/Self Care Home Management;Aquatic Therapy;Cryotherapy;Electrical Stimulation;Iontophoresis 4mg /ml Dexamethasone;Moist Heat;Traction;Ultrasound;Contrast Bath;DME Instruction;Gait training;Stair training;Functional mobility training;Therapeutic activities;Therapeutic exercise;Balance training;Neuromuscular re-education;Cognitive remediation;Patient/family education;Orthotic Fit/Training;Manual techniques;Passive range of motion;Dry needling;Energy conservation;Taping    PT Next Visit Plan  follow up about potential SLP referral, review HEP and progress as appropriate, complete Berg Balance Test, balance, strength, and gait training    PT Home Exercise Plan  continue as given;     Consulted and Agree with Plan of Care  Patient;Family member/caregiver    Family Member Consulted  Daughter and [place], [name]       Patient will benefit from skilled therapeutic intervention in order to improve the following deficits and impairments:  Abnormal gait, Decreased activity tolerance, Decreased balance, Decreased cognition, Decreased endurance, Decreased knowledge of use of DME, Decreased mobility, Decreased safety awareness, Decreased strength, Difficulty walking, Impaired perceived functional ability, Impaired flexibility, Impaired UE functional use, Improper body mechanics  Visit Diagnosis: Unsteadiness on feet  Muscle weakness (generalized)  Other abnormalities of gait and mobility     Problem List Patient Active Problem List   Diagnosis Date Noted  . Gait abnormality 04/10/2017  . Taste disorder 04/10/2017  . Hives 04/10/2017  . Constipation 04/10/2017  .  Healthcare maintenance 03/19/2016  . Memory loss 12/11/2015  . SSS (sick sinus syndrome)  ([place]) 05/17/2015  . Chest pain, rule out acute myocardial infarction 02/06/2015  . Hypothyroidism 11/02/2014  . PAF- none documented in years, not on anticoagulation 10/27/2014  . Troponin level elevated- 0.10 post pacemaker 10/27/2014  . Diaphragmatic stimulation by pacemaker 10/26/2014  . Fall at home 07/14/2014  . Bruit of right carotid artery 01/26/2014  . Medicare annual wellness visit, subsequent 12/22/2013  . Advance care planning 12/22/2013  . Mixed hyperlipidemia 07/29/2013  . Fatty liver 06/04/2012  . Loose stools 02/01/2012  . Murmur, cardiac 11/28/2011  . Back pain 08/29/2011  . Osteoporosis 06/26/2011  . Vertigo 03/05/2011  . Neck pain 03/05/2011  . Rash 11/16/2010  . Essential hypertension, benign 11/16/2010  . CAD S/P PCI 2008 11/16/2010  . Pacemaker - dual chamber St. Jude gen change 2016 11/16/2010  . Tinnitus of both ears 11/16/2010  . Insomnia 11/16/2010    Maico Mulvehill PT, DPT 05/30/2017, 10:57 AM  [place] MAIN [facility] SERVICES [address] [place], [place], [address] Phone: [phone]   Fax:  [phone]  Name: [name] MRN: [id number] Date of Birth: 07-07-1927

## 2017-06-03 ENCOUNTER — Ambulatory Visit: Payer: Medicare Other | Admitting: Physical Therapy

## 2017-06-04 ENCOUNTER — Ambulatory Visit (INDEPENDENT_AMBULATORY_CARE_PROVIDER_SITE_OTHER): Payer: Medicare Other | Admitting: *Deleted

## 2017-06-04 DIAGNOSIS — I495 Sick sinus syndrome: Secondary | ICD-10-CM | POA: Diagnosis not present

## 2017-06-04 NOTE — Progress Notes (Signed)
Remote pacemaker transmission.   

## 2017-06-05 ENCOUNTER — Ambulatory Visit: Payer: Medicare Other | Admitting: Physical Therapy

## 2017-06-07 ENCOUNTER — Encounter: Payer: Self-pay | Admitting: Cardiology

## 2017-06-10 ENCOUNTER — Ambulatory Visit: Payer: Medicare Other | Admitting: Physical Therapy

## 2017-06-10 ENCOUNTER — Encounter: Payer: Self-pay | Admitting: Physical Therapy

## 2017-06-10 DIAGNOSIS — R2681 Unsteadiness on feet: Secondary | ICD-10-CM

## 2017-06-10 DIAGNOSIS — M6281 Muscle weakness (generalized): Secondary | ICD-10-CM

## 2017-06-10 DIAGNOSIS — R2689 Other abnormalities of gait and mobility: Secondary | ICD-10-CM

## 2017-06-10 NOTE — Therapy (Signed)
[place] MAIN [facility] SERVICES [address] [place], [place], [address] Phone: [phone]   Fax:  [phone]  Physical Therapy Treatment/Progress Note  Patient Details  Name: [name] MRN: [id number] Date of Birth: 1928/02/16 Referring Provider: Elsie Stain, MD   Encounter Date: 06/10/2017  PT End of Session - 06/10/17 0939    Visit Number  7    Number of Visits  21    Date for PT Re-Evaluation  07/08/17    Authorization Type  G codes    Authorization Time Period  7/10    PT Start Time  0934    PT Stop Time  1025    PT Time Calculation (min)  51 min    Equipment Utilized During Treatment  Gait belt    Activity Tolerance  Patient tolerated treatment well;No increased pain    Behavior During Therapy  WFL for tasks assessed/performed       Past Medical History:  Diagnosis Date  . Allergy   . Arthritis   . CAD (coronary artery disease)   . Fatty liver    on u/s 2010  . Heart murmur   . Hyperlipidemia   . Hypertension   . Insomnia   . Myocardial infarction ([place])   . Osteoporosis    dxa 2013  . PAF (paroxysmal atrial fibrillation) ([place])   . SSS (sick sinus syndrome) ([place])    St. Jude Permanent Pacemaker 09/23/06  . Symptomatic bradycardia     Past Surgical History:  Procedure Laterality Date  . BACK SURGERY    . CARPAL TUNNEL RELEASE     left  . CHOLECYSTECTOMY    . CORONARY ANGIOPLASTY WITH STENT PLACEMENT  12/03/06   Stent to distal RCA  . CORONARY ANGIOPLASTY WITH STENT PLACEMENT  09/12/06   Stenting mid distal segment & PTCA alone distal RCA prox. to the posterior descending artery takeoff.  . CORONARY STENT PLACEMENT     RCA stent replaced   . EP IMPLANTABLE DEVICE N/A 10/26/2014   Procedure: PPM Generator Changeout;  Surgeon: Sanda Klein, MD;  Location: [facility];  Service: Cardiovascular;  Laterality: N/A;  . EP IMPLANTABLE DEVICE N/A 10/26/2014   Procedure: Pacemaker Revision- new RV Lead;  Surgeon:  Sanda Klein, MD;  Location: [place] [facility];  Service: Cardiovascular;  Laterality: N/A;  . FOOT SURGERY     Left  . HERNIA REPAIR     R inguinal  . PACEMAKER INSERTION  09/23/06   St.Jude  . US ECHOCARDIOGRAPHY  11/26/11   prox. septal thickening, mild concentric LVH, mod. ca+ AOV,trace AI,mild to mod valvular aortic stenosis.    There were no vitals filed for this visit.  Subjective Assessment - 06/10/17 0944    Subjective  patient reports feeling "blah"; She denies any pain and denies any new falls; She presents to therapy without any assistive device;     Patient is accompained by:  Family member Daughter, [name]    Pertinent History  Pt is a 83 y/o F who presents due to instability while walking.  She is a poor historian so some imformation provided by her daughter, [name].  Pt reports she has been stumbling when she gets going too fast.  She denies any falls in the past 6 months.  Pt is not using AD.  She has been thinking about starting to use a cane but has not done so yet.  Daughter reports when the pt fatigues she tends to  lean forward more and shuffles her feet more, making her unsteady.  Pt likes to wear slip on shoes but daughter has been pushing the patient to wear tennis shoes.  Pt does have a h/o back pain which is mostly due to fatigue after physical activities such as weed eating. Pt is active doing yard work, house activities.  Daughter would like pt to be more active in the community and has suggested going to the senior center but pt has so far not been interested.  Pt can no longer drive due to diagnosis of Alzheimer's with vascular dementia.  Pt reports having difficulty swallowing.  Her daughter reports she is holding it in her mouth, takes a drink, and has difficulty swallowing.  Daughter believes this happens more when she drinks rather than when she eats.  This has been going on for several years and only her PCP is aware.  Suggestion was made for a speech evaluation and the  pt and daughter would like to think about it.     Limitations  Walking;House hold activities    How long can you sit comfortably?  unlimited    How long can you stand comfortably?  pt unable to answer    How long can you walk comfortably?  10-15 minutes before she needs to lean on something    Patient Stated Goals  to improve her walking and her stability.  To determine if she should be using a cane.     Currently in Pain?  No/denies         [facility] PT Assessment - 06/10/17 0001      Observation/Other Assessments   Activities of Balance Confidence Scale (ABC Scale)   65%, limited functional mobility; Improved from 50% on 05/02/17      6 Minute Walk- Baseline   BP (mmHg)  140/70    HR (bpm)  79    02 Sat (%RA)  100 %      6 Minute walk- Post Test   BP (mmHg)  145/46    HR (bpm)  88    02 Sat (%RA)  97 %      6 minute walk test results    Aerobic Endurance Distance Walked  1115    Endurance additional comments  without AD, community ambulator distance; improved from 05/02/17 which was 885 feet      Standardized Balance Assessment   Five times sit to stand comments   15 sec without HHA (15 sec indicates slight risk for falls improved from 27 sec on 05/02/17)      Berg Balance Test   Sit to Stand  Able to stand without using hands and stabilize independently    Standing Unsupported  Able to stand safely 2 minutes    Sitting with Back Unsupported but Feet Supported on Floor or Stool  Able to sit safely and securely 2 minutes    Stand to Sit  Sits safely with minimal use of hands    Transfers  Able to transfer safely, minor use of hands    Standing Unsupported with Eyes Closed  Able to stand 10 seconds safely    Standing Ubsupported with Feet Together  Able to place feet together independently and stand 1 minute safely    From Standing, Reach Forward with Outstretched Arm  Can reach confidently >25 cm (10")    From Standing Position, Pick up Object from Floor  Able to pick up shoe  safely and easily    From Standing  Position, Turn to Look Behind Over each Shoulder  Looks behind from both sides and weight shifts well    Turn 360 Degrees  Able to turn 360 degrees safely but slowly    Standing Unsupported, Alternately Place Feet on Step/Stool  Able to stand independently and safely and complete 8 steps in 20 seconds    Standing Unsupported, One Foot in Front  Able to plae foot ahead of the other independently and hold 30 seconds    Standing on One Leg  Able to lift leg independently and hold equal to or more than 3 seconds    Total Score  51    Berg comment:  >50% risk for falls, improved from 48/56 on 05/02/17        TREATMENT PT instructed patient in ABC scale, Berg Balance assessment 6 min walk and other outcome measures; see above;  Patient required min Vcs for correct activity technique;  Educated patient on progress towards goals and recommendations;   Patient educated on importance of using heat to joints when feeling increased pain related to OA; Also discussed importance of using RW when walking at night or when walking outside for better safety awareness; Will address using walker at next visit;  Educated patient on ways to reduce fall risk at home;                   PT Education - 06/10/17 0945    Education provided  Yes    Education Details  progress towards goals, recommendations, balance, HEP reinforced;     Person(s) Educated  Patient    Methods  Explanation;Demonstration;Verbal cues    Comprehension  Verbalized understanding;Returned demonstration;Verbal cues required;Need further instruction       PT Short Term Goals - 06/10/17 0945      PT SHORT TERM GOAL #1   Title  Pt will be compliant with HEP for carryover between sessions    Time  2    Period  Weeks    Status  Achieved        PT Long Term Goals - 06/10/17 0946      PT LONG TERM GOAL #1   Title  Pt will improve her 5xSTS time to <15 seconds to demonstrate improved  BLE strength and balance    Baseline  27.13 seconds and pt pushes on knees to stand    Time  4    Period  Weeks    Status  Partially Met    Target Date  07/08/17      PT LONG TERM GOAL #2   Title  Pt will improve her 68mT distance to at least 1,100 ft to demonstrate improved ambulatory endurance and speed    Baseline  885 ft    Time  6    Period  Weeks    Status  Achieved    Target Date  07/08/17      PT LONG TERM GOAL #3   Title  Pt will improve her Berg Balance Test score to at least 54/56 to demonstrate improved balance    Baseline  48/56    Time  4    Period  Weeks    Status  Partially Met    Target Date  07/08/17            Plan - 06/10/17 1102    Clinical Impression Statement  Patient has made progress towards goals exhibiting improved gait ability, balance and sit<>Stand ability; She does still test as a  slight risk for falls; patient reports not being adherent to HEP over last week or so due to increased knee pain and fatigued. When PT suggested patient try using heating pad the  patient agreed but the daughter states that she won't use it. There seems to be inconsistency in what the patient is saying vs what the patient is actually doing. She would benefit from additional skilled PT intervention to improve strength, balance and gait safety and reduce fall risk;     Rehab Potential  Good    PT Frequency  2x / week    PT Duration  4 weeks    PT Treatment/Interventions  ADLs/Self Care Home Management;Aquatic Therapy;Cryotherapy;Electrical Stimulation;Iontophoresis 49m/ml Dexamethasone;Moist Heat;Traction;Ultrasound;Contrast Bath;DME Instruction;Gait training;Stair training;Functional mobility training;Therapeutic activities;Therapeutic exercise;Balance training;Neuromuscular re-education;Cognitive remediation;Patient/family education;Orthotic Fit/Training;Manual techniques;Passive range of motion;Dry needling;Energy conservation;Taping    PT Next Visit Plan  address gait;  work on safety with RW for nighttime use and when outside;     PT Home Exercise Plan  continue as given;     Consulted and Agree with Plan of Care  Patient;Family member/caregiver    Family Member Consulted  Daughter and [place] [name]      Patient will benefit from skilled therapeutic intervention in order to improve the following deficits and impairments:  Abnormal gait, Decreased activity tolerance, Decreased balance, Decreased cognition, Decreased endurance, Decreased knowledge of use of DME, Decreased mobility, Decreased safety awareness, Decreased strength, Difficulty walking, Impaired perceived functional ability, Impaired flexibility, Impaired UE functional use, Improper body mechanics  Visit Diagnosis: Unsteadiness on feet  Muscle weakness (generalized)  Other abnormalities of gait and mobility     Problem List Patient Active Problem List   Diagnosis Date Noted  . Gait abnormality 04/10/2017  . Taste disorder 04/10/2017  . Hives 04/10/2017  . Constipation 04/10/2017  . Healthcare maintenance 03/19/2016  . Memory loss 12/11/2015  . [place]  . Chest pain, rule out acute myocardial infarction 02/06/2015  . Hypothyroidism 11/02/2014  . PAF- none documented in years, not on anticoagulation 10/27/2014  . Troponin level elevated- 0.10 post pacemaker 10/27/2014  . Diaphragmatic stimulation by pacemaker 10/26/2014  . Fall at home 07/14/2014  . Bruit of right carotid artery 01/26/2014  . Medicare annual wellness visit, subsequent 12/22/2013  . Advance care planning 12/22/2013  . Mixed hyperlipidemia 07/29/2013  . Fatty liver 06/04/2012  . Loose stools 02/01/2012  . Murmur, cardiac 11/28/2011  . Back pain 08/29/2011  . Osteoporosis 06/26/2011  . Vertigo 03/05/2011  . Neck pain 03/05/2011  . Rash 11/16/2010  . Essential hypertension, benign 11/16/2010  . CAD S/P PCI 2008 11/16/2010  . Pacemaker - dual chamber St. Jude gen change 2016  11/16/2010  . Tinnitus of both ears 11/16/2010  . Insomnia 11/16/2010    [name],Kerri PT, DPT 06/10/2017, 11:08 AM  [place] [facility] [address] [address]Phone: [phone]  Fax:  [phone] Name: EMANNIE WINELANDMRN: [id number] of Birth: 108/06/1927

## 2017-06-11 NOTE — Progress Notes (Signed)
I agree.  Thanks 06/11/17 6:00 AM

## 2017-06-12 ENCOUNTER — Encounter: Payer: Self-pay | Admitting: Physical Therapy

## 2017-06-12 ENCOUNTER — Ambulatory Visit: Payer: Medicare Other | Admitting: Physical Therapy

## 2017-06-12 DIAGNOSIS — R2689 Other abnormalities of gait and mobility: Secondary | ICD-10-CM

## 2017-06-12 DIAGNOSIS — M6281 Muscle weakness (generalized): Secondary | ICD-10-CM | POA: Diagnosis not present

## 2017-06-12 DIAGNOSIS — R2681 Unsteadiness on feet: Secondary | ICD-10-CM

## 2017-06-12 NOTE — Therapy (Signed)
[place] MAIN [facility] SERVICES [address] [place], [place], [address] Phone: [phone]   Fax:  [phone]  Physical Therapy Treatment  Patient Details  Name: [name] MRN: [id number] Date of Birth: 09/19/1927 Referring Provider: Elsie Stain, MD   Encounter Date: 06/12/2017  PT End of Session - 06/12/17 0929    Visit Number  8    Number of Visits  21    Date for PT Re-Evaluation  07/08/17    Authorization Type  G codes    Authorization Time Period  8/10    PT Start Time  0925    PT Stop Time  1010    PT Time Calculation (min)  45 min    Equipment Utilized During Treatment  Gait belt    Activity Tolerance  Patient tolerated treatment well;No increased pain    Behavior During Therapy  WFL for tasks assessed/performed       Past Medical History:  Diagnosis Date  . Allergy   . Arthritis   . CAD (coronary artery disease)   . Fatty liver    on u/s 2010  . Heart murmur   . Hyperlipidemia   . Hypertension   . Insomnia   . Myocardial infarction ([place])   . Osteoporosis    dxa 2013  . PAF (paroxysmal atrial fibrillation) ([place])   . SSS (sick sinus syndrome) ([place])    St. Jude Permanent Pacemaker 09/23/06  . Symptomatic bradycardia     Past Surgical History:  Procedure Laterality Date  . BACK SURGERY    . CARPAL TUNNEL RELEASE     left  . CHOLECYSTECTOMY    . CORONARY ANGIOPLASTY WITH STENT PLACEMENT  12/03/06   Stent to distal RCA  . CORONARY ANGIOPLASTY WITH STENT PLACEMENT  09/12/06   Stenting mid distal segment & PTCA alone distal RCA prox. to the posterior descending artery takeoff.  . CORONARY STENT PLACEMENT     RCA stent replaced   . EP IMPLANTABLE DEVICE N/A 10/26/2014   Procedure: PPM Generator Changeout;  Surgeon: Sanda Klein, MD;  Location: [place] [facility];  Service: Cardiovascular;  Laterality: N/A;  . EP IMPLANTABLE DEVICE N/A 10/26/2014   Procedure: Pacemaker Revision- new RV Lead;  Surgeon: Sanda Klein, MD;  Location: [place] [facility];  Service: Cardiovascular;  Laterality: N/A;  . FOOT SURGERY     Left  . HERNIA REPAIR     R inguinal  . PACEMAKER INSERTION  09/23/06   St.Jude  . US ECHOCARDIOGRAPHY  11/26/11   prox. septal thickening, mild concentric LVH, mod. ca+ AOV,trace AI,mild to mod valvular aortic stenosis.    There were no vitals filed for this visit.  Subjective Assessment - 06/12/17 0927    Subjective  Patient reports feeling pretty good this morning, she is just a little tired;     Patient is accompained by:  Family member Daughter, [name]    Pertinent History  Pt is a 83 y/o F who presents due to instability while walking.  She is a poor historian so some imformation provided by her daughter, [name].  Pt reports she has been stumbling when she gets going too fast.  She denies any falls in the past 6 months.  Pt is not using AD.  She has been thinking about starting to use a cane but has not done so yet.  Daughter reports when the pt fatigues she tends to lean forward more and shuffles her feet more, making  her unsteady.  Pt likes to wear slip on shoes but daughter has been pushing the patient to wear tennis shoes.  Pt does have a h/o back pain which is mostly due to fatigue after physical activities such as weed eating. Pt is active doing yard work, house activities.  Daughter would like pt to be more active in the community and has suggested going to the senior center but pt has so far not been interested.  Pt can no longer drive due to diagnosis of Alzheimer's with vascular dementia.  Pt reports having difficulty swallowing.  Her daughter reports she is holding it in her mouth, takes a drink, and has difficulty swallowing.  Daughter believes this happens more when she drinks rather than when she eats.  This has been going on for several years and only her PCP is aware.  Suggestion was made for a speech evaluation and the pt and daughter would like to think about it.      Limitations  Walking;House hold activities    How long can you sit comfortably?  unlimited    How long can you stand comfortably?  pt unable to answer    How long can you walk comfortably?  10-15 minutes before she needs to lean on something    Patient Stated Goals  to improve her walking and her stability.  To determine if she should be using a cane.     Currently in Pain?  No/denies         TREATMENT: Warm up on Nustep BUE/BLE level 2 x4 min (Unbilled);  Patient educated in safety with using walker at times at home including using it at night, or when feeling pain/fatigue and/or when walking outside;  Patient would benefit from reinforcement as she does not seem to think that she needs to use the walker  Patient educated on stair negotiation with RW for safe entry/exit into home; Patient required mod Vcs for walker placement and sequencing for safety; She was able to negotiate 4 steps with RW forward non-reciprocal x3 sets with supervision;  Patient negotiated cones weaving #5 with RW x2 sets with cues to stay close to RW for safety;  Patient educated on safe rollator use with locking/unlocking; She required mod-max VCs and instruction for safety break use; Often when walking patient will forget to lock when negotiating curb or trying to sit requiring reminders; She would benefit from reinstruction with safe rollator use to reduce falls;  Patient negotiated curb x3 reps with rollator with supervision and mod VCs for walker placement/sequencing; Instructed patient to pick up walker from front center for better lifting ease;  Patient ambulated on grass x50 feet with rollator exhibiting better gait speed and safety; She expressed that she was walking faster with rollator as compared to without;  Patient educated on safe transfers with rollator x3 sets with cues for locking/unlocking; Instructed patient that the benefit of rollator is to be able to sit when out in public and fatigued;  Patient nodded in agreement;   Following session, answered patient's questions regarding safe exercise use. Recommended patient not use treadmill or stationary bike due to poor safety awareness and history of back pain; Also educated patient on how a lumbar brace would probably not help reduce pain as there is no evidence on lumbar brace use for OA.                      PT Education - 06/12/17 [phone]    Education  provided  Yes    Education Details  dynamic balance, gait safety;     Person(s) Educated  Patient    Methods  Explanation;Demonstration;Verbal cues    Comprehension  Verbalized understanding;Returned demonstration;Verbal cues required;Need further instruction       PT Short Term Goals - 06/10/17 0945      PT SHORT TERM GOAL #1   Title  Pt will be compliant with HEP for carryover between sessions    Time  2    Period  Weeks    Status  Achieved        PT Long Term Goals - 06/10/17 0946      PT LONG TERM GOAL #1   Title  Pt will improve her 5xSTS time to <15 seconds to demonstrate improved BLE strength and balance    Baseline  27.13 seconds and pt pushes on knees to stand    Time  4    Period  Weeks    Status  Partially Met    Target Date  07/08/17      PT LONG TERM GOAL #2   Title  Pt will improve her 21mT distance to at least 1,100 ft to demonstrate improved ambulatory endurance and speed    Baseline  885 ft    Time  6    Period  Weeks    Status  Achieved    Target Date  07/08/17      PT LONG TERM GOAL #3   Title  Pt will improve her Berg Balance Test score to at least 54/56 to demonstrate improved balance    Baseline  48/56    Time  4    Period  Weeks    Status  Partially Met    Target Date  07/08/17            Plan - 06/12/17 1207    Clinical Impression Statement  Patient and caregiver instructed and educated in safe walker use for inside and outside of home. Patient expressed that she doesn't think she needs a walker. PT  reinforced importance of using RW at night and first thing in the morning to help improve gait safety and reduce fall risk in the home. Patient has OA which leads to stiffness and shuffled gait in the morning; Using a walker will improve safety; Patient disagrees. Patient also educated in use of rollator when outside for better ease of gait; She did require increased cues for locking/unlocking rollator; would benefit from additional skilled PT intervention to improve gait safety and reduce fall risk;     Rehab Potential  Good    PT Frequency  2x / week    PT Duration  4 weeks    PT Treatment/Interventions  ADLs/Self Care Home Management;Aquatic Therapy;Cryotherapy;Electrical Stimulation;Iontophoresis '4mg'$ /ml Dexamethasone;Moist Heat;Traction;Ultrasound;Contrast Bath;DME Instruction;Gait training;Stair training;Functional mobility training;Therapeutic activities;Therapeutic exercise;Balance training;Neuromuscular re-education;Cognitive remediation;Patient/family education;Orthotic Fit/Training;Manual techniques;Passive range of motion;Dry needling;Energy conservation;Taping    PT Next Visit Plan  address gait; work on safety with RW for nighttime use and when outside;     PT Home Exercise Plan  continue as given;     Consulted and Agree with Plan of Care  Patient;Family member/caregiver    Family Member Consulted  Daughter and HGood Thunder TOtila [name]      Patient will benefit from skilled therapeutic intervention in order to improve the following deficits and impairments:  Abnormal gait, Decreased activity tolerance, Decreased balance, Decreased cognition, Decreased endurance, Decreased knowledge of use of DME, Decreased mobility, Decreased safety awareness,  Decreased strength, Difficulty walking, Impaired perceived functional ability, Impaired flexibility, Impaired UE functional use, Improper body mechanics  Visit Diagnosis: Unsteadiness on feet  Muscle weakness (generalized)  Other abnormalities of gait and  mobility     Problem List Patient Active Problem List   Diagnosis Date Noted  . Gait abnormality 04/10/2017  . Taste disorder 04/10/2017  . Hives 04/10/2017  . Constipation 04/10/2017  . Healthcare maintenance 03/19/2016  . Memory loss 12/11/2015  . SSS (sick sinus syndrome) ([place]) 05/17/2015  . Chest pain, rule out acute myocardial infarction 02/06/2015  . Hypothyroidism 11/02/2014  . PAF- none documented in years, not on anticoagulation 10/27/2014  . Troponin level elevated- 0.10 post pacemaker 10/27/2014  . Diaphragmatic stimulation by pacemaker 10/26/2014  . Fall at home 07/14/2014  . Bruit of right carotid artery 01/26/2014  . Medicare annual wellness visit, subsequent 12/22/2013  . Advance care planning 12/22/2013  . Mixed hyperlipidemia 07/29/2013  . Fatty liver 06/04/2012  . Loose stools 02/01/2012  . Murmur, cardiac 11/28/2011  . Back pain 08/29/2011  . Osteoporosis 06/26/2011  . Vertigo 03/05/2011  . Neck pain 03/05/2011  . Rash 11/16/2010  . Essential hypertension, benign 11/16/2010  . CAD S/P PCI 2008 11/16/2010  . Pacemaker - dual chamber St. Jude gen change 2016 11/16/2010  . Tinnitus of both ears 11/16/2010  . Insomnia 11/16/2010    Trotter,[name] PT, DPT 06/12/2017, 12:10 PM  [place] MAIN [facility] SERVICES [address] [place], [place], [address] Phone: [phone]   Fax:  [phone]  Name: KAMBER VIGNOLA MRN: [id number] Date of Birth: 1928/05/28

## 2017-06-13 ENCOUNTER — Telehealth: Payer: Self-pay | Admitting: *Deleted

## 2017-06-13 NOTE — Telephone Encounter (Signed)
Daughter [name]) says patient has been feeling unusually tired and fatigued so they have taken her BP: 147/67   And 147/75 yesterday 131/59 today.  Daughter says she would like to get a general range that her BP should be.

## 2017-06-14 NOTE — Telephone Encounter (Signed)
Patient's daughter [name]) notified as instructed and verbalized understanding.

## 2017-06-14 NOTE — Telephone Encounter (Signed)
Given her age I do not want to make her blood pressure go too low.  If she is at all lightheaded, then we need to lower the dose of her losartan.  The goal would be to keep her systolic in the 826E, not lower.  The general goal would be to keep her diastolic in the 15A or 30N.  If her diastolic is lower than 80 and she is feeling fine, then that is likely okay.  There is a chance that her fatigue is related to a low blood pressure, so it would be reasonable to cut her losartan in half for a few days and see how she does.  Please update me as needed.  Thanks.

## 2017-06-19 ENCOUNTER — Ambulatory Visit: Payer: Medicare Other | Admitting: Physical Therapy

## 2017-06-24 ENCOUNTER — Ambulatory Visit: Payer: Medicare Other

## 2017-06-24 DIAGNOSIS — M6281 Muscle weakness (generalized): Secondary | ICD-10-CM | POA: Diagnosis not present

## 2017-06-24 DIAGNOSIS — R2689 Other abnormalities of gait and mobility: Secondary | ICD-10-CM | POA: Diagnosis not present

## 2017-06-24 DIAGNOSIS — R2681 Unsteadiness on feet: Secondary | ICD-10-CM

## 2017-06-24 NOTE — Therapy (Signed)
[place] MAIN [facility] SERVICES [address] [place], [place], [address] Phone: [phone]   Fax:  [phone]  Physical Therapy Treatment  Patient Details  Name: [name] MRN: [id number] Date of Birth: 12/24/1927 Referring Provider: Elsie Stain, MD   Encounter Date: 06/24/2017  PT End of Session - 06/24/17 0906    Visit Number  9    Number of Visits  21    Date for PT Re-Evaluation  07/08/17    Authorization Type  G codes    Authorization Time Period  9/10    PT Start Time  9470    PT Stop Time  0959    PT Time Calculation (min)  45 min    Equipment Utilized During Treatment  Gait belt    Activity Tolerance  Patient tolerated treatment well;No increased pain    Behavior During Therapy  WFL for tasks assessed/performed       Past Medical History:  Diagnosis Date  . Allergy   . Arthritis   . CAD (coronary artery disease)   . Fatty liver    on u/s 2010  . Heart murmur   . Hyperlipidemia   . Hypertension   . Insomnia   . Myocardial infarction ([place])   . Osteoporosis    dxa 2013  . PAF (paroxysmal atrial fibrillation) ([place])   . SSS (sick sinus syndrome) ([place])    St. Jude Permanent Pacemaker 09/23/06  . Symptomatic bradycardia     Past Surgical History:  Procedure Laterality Date  . BACK SURGERY    . CARPAL TUNNEL RELEASE     left  . CHOLECYSTECTOMY    . CORONARY ANGIOPLASTY WITH STENT PLACEMENT  12/03/06   Stent to distal RCA  . CORONARY ANGIOPLASTY WITH STENT PLACEMENT  09/12/06   Stenting mid distal segment & PTCA alone distal RCA prox. to the posterior descending artery takeoff.  . CORONARY STENT PLACEMENT     RCA stent replaced   . EP IMPLANTABLE DEVICE N/A 10/26/2014   Procedure: PPM Generator Changeout;  Surgeon: Sanda Klein, MD;  Location: [place] [facility];  Service: Cardiovascular;  Laterality: N/A;  . EP IMPLANTABLE DEVICE N/A 10/26/2014   Procedure: Pacemaker Revision- new RV Lead;  Surgeon: Sanda Klein, MD;  Location: [place] [facility];  Service: Cardiovascular;  Laterality: N/A;  . FOOT SURGERY     Left  . HERNIA REPAIR     R inguinal  . PACEMAKER INSERTION  09/23/06   St.Jude  . US ECHOCARDIOGRAPHY  11/26/11   prox. septal thickening, mild concentric LVH, mod. ca+ AOV,trace AI,mild to mod valvular aortic stenosis.    There were no vitals filed for this visit.  Subjective Assessment - 06/24/17 0918    Subjective  Patient's daughter reports that she has not been compliant with HEP at all and has not been using her walker at night. Has new walker. Refuses to use it at night, does not remember being taught last session about walker.     Patient is accompained by:  Family member Daughter, [name]    Pertinent History  Pt is a 83 y/o F who presents due to instability while walking.  She is a poor historian so some imformation provided by her daughter, [name].  Pt reports she has been stumbling when she gets going too fast.  She denies any falls in the past 6 months.  Pt is not using AD.  She has been thinking about starting to use  a cane but has not done so yet.  Daughter reports when the pt fatigues she tends to lean forward more and shuffles her feet more, making her unsteady.  Pt likes to wear slip on shoes but daughter has been pushing the patient to wear tennis shoes.  Pt does have a h/o back pain which is mostly due to fatigue after physical activities such as weed eating. Pt is active doing yard work, house activities.  Daughter would like pt to be more active in the community and has suggested going to the senior center but pt has so far not been interested.  Pt can no longer drive due to diagnosis of Alzheimer's with vascular dementia.  Pt reports having difficulty swallowing.  Her daughter reports she is holding it in her mouth, takes a drink, and has difficulty swallowing.  Daughter believes this happens more when she drinks rather than when she eats.  This has been going on for several  years and only her PCP is aware.  Suggestion was made for a speech evaluation and the pt and daughter would like to think about it.     Limitations  Walking;House hold activities    How long can you sit comfortably?  unlimited    How long can you stand comfortably?  pt unable to answer    How long can you walk comfortably?  10-15 minutes before she needs to lean on something    Patient Stated Goals  to improve her walking and her stability.  To determine if she should be using a cane.     Currently in Pain?  No/denies       Nustep Lvl 3 4 minutes    Walker fitting/adjustment  Sit to stand with walker, safety cueing for hand placement, one hand on bed one hand on walker. 2x 15 with task in the middle of it  Ambulate around 5 cones with RW , maintained good COM and stayed within walker.   Walk in hallway with head turns and RW,cues for horizontal head turns and vertical head turns. No LOB, good control of RW.   Step over orange hurdle 20x each leg with BUE support, repeated ABC's while performing with no LOB    side step over hurdle 15x each side with SUE support to replicate side stepping into bathroom over lip of doorway.  Cone taps with PT directing cone and color, max cueing for foot and orientation to task due to confusion and poor memory.   Patient and daughter educated on safe mobility with RW.                     PT Education - 06/24/17 1020    Education provided  Yes    Education Details  safe ambulation and mobility with RW,     Person(s) Educated  Patient    Methods  Explanation;Demonstration;Verbal cues    Comprehension  Verbalized understanding;Returned demonstration       PT Short Term Goals - 06/10/17 0945      PT SHORT TERM GOAL #1   Title  Pt will be compliant with HEP for carryover between sessions    Time  2    Period  Weeks    Status  Achieved        PT Long Term Goals - 06/10/17 0946      PT LONG TERM GOAL #1   Title  Pt will  improve her 5xSTS time to <15 seconds to demonstrate improved  BLE strength and balance    Baseline  27.13 seconds and pt pushes on knees to stand    Time  4    Period  Weeks    Status  Partially Met    Target Date  07/08/17      PT LONG TERM GOAL #2   Title  Pt will improve her 61mT distance to at least 1,100 ft to demonstrate improved ambulatory endurance and speed    Baseline  885 ft    Time  6    Period  Weeks    Status  Achieved    Target Date  07/08/17      PT LONG TERM GOAL #3   Title  Pt will improve her BMerrilee JanskyBalance Test score to at least 54/56 to demonstrate improved balance    Baseline  48/56    Time  4    Period  Weeks    Status  Partially Met    Target Date  07/08/17            Plan - 06/24/17 1023    Clinical Impression Statement  This is patient's last session due to patient preference. Patient's poor memory and compliance with HEP limited effect of therapy. Patient's daughter frustrated with patient compliance. Patient and daughter educated on safe ambulation with RW and sit to stand transfers utilizing one hand on chair and one hand on walker. I will be happy to see patient again in the future.     Rehab Potential  Good    PT Frequency  2x / week    PT Duration  4 weeks    PT Treatment/Interventions  ADLs/Self Care Home Management;Aquatic Therapy;Cryotherapy;Electrical Stimulation;Iontophoresis 4107mml Dexamethasone;Moist Heat;Traction;Ultrasound;Contrast Bath;DME Instruction;Gait training;Stair training;Functional mobility training;Therapeutic activities;Therapeutic exercise;Balance training;Neuromuscular re-education;Cognitive remediation;Patient/family education;Orthotic Fit/Training;Manual techniques;Passive range of motion;Dry needling;Energy conservation;Taping    PT Next Visit Plan  --    PT Home Exercise Plan  continue as given;     Consulted and Agree with Plan of Care  Patient;Family member/caregiver    Family Member Consulted  Daughter and HCYah-ta-heyTiOtila [name]      Patient will benefit from skilled therapeutic intervention in order to improve the following deficits and impairments:  Abnormal gait, Decreased activity tolerance, Decreased balance, Decreased cognition, Decreased endurance, Decreased knowledge of use of DME, Decreased mobility, Decreased safety awareness, Decreased strength, Difficulty walking, Impaired perceived functional ability, Impaired flexibility, Impaired UE functional use, Improper body mechanics  Visit Diagnosis: Unsteadiness on feet  Muscle weakness (generalized)  Other abnormalities of gait and mobility     Problem List Patient Active Problem List   Diagnosis Date Noted  . Gait abnormality 04/10/2017  . Taste disorder 04/10/2017  . Hives 04/10/2017  . Constipation 04/10/2017  . Healthcare maintenance 03/19/2016  . Memory loss 12/11/2015  . [place]  . Chest pain, rule out acute myocardial infarction 02/06/2015  . Hypothyroidism 11/02/2014  . PAF- none documented in years, not on anticoagulation 10/27/2014  . Troponin level elevated- 0.10 post pacemaker 10/27/2014  . Diaphragmatic stimulation by pacemaker 10/26/2014  . Fall at home 07/14/2014  . Bruit of right carotid artery 01/26/2014  . Medicare annual wellness visit, subsequent 12/22/2013  . Advance care planning 12/22/2013  . Mixed hyperlipidemia 07/29/2013  . Fatty liver 06/04/2012  . Loose stools 02/01/2012  . Murmur, cardiac 11/28/2011  . Back pain 08/29/2011  . Osteoporosis 06/26/2011  . Vertigo 03/05/2011  . Neck pain 03/05/2011  . Rash 11/16/2010  .  Essential hypertension, benign 11/16/2010  . CAD S/P PCI 2008 11/16/2010  . Pacemaker - dual chamber St. Jude gen change 2016 11/16/2010  . Tinnitus of both ears 11/16/2010  . Insomnia 11/16/2010   Janna Arch, PT, DPT   Janna Arch 06/24/2017, 10:25 AM  [place] MAIN [facility] SERVICES [address] [place],  [place], [address] Phone: [phone]   Fax:  [phone]  Name: GUDELIA EUGENE MRN: [id number] Date of Birth: 05/07/1928

## 2017-06-26 ENCOUNTER — Ambulatory Visit: Payer: Medicare Other | Admitting: Physical Therapy

## 2017-06-27 LAB — CUP PACEART REMOTE DEVICE CHECK
Battery Remaining Longevity: 122 mo
Battery Remaining Percentage: 95.5 %
Battery Voltage: 3.01 V
Brady Statistic AP VS Percent: 53 %
Brady Statistic AS VS Percent: 47 %
Date Time Interrogation Session: 20181211070016
Implantable Lead Implant Date: 20080331
Implantable Pulse Generator Implant Date: 20160503
Lead Channel Impedance Value: 340 Ohm
Lead Channel Pacing Threshold Amplitude: 0.75 V
Lead Channel Pacing Threshold Amplitude: 1.25 V
Lead Channel Pacing Threshold Pulse Width: 0.4 ms
Lead Channel Sensing Intrinsic Amplitude: 1.5 mV
Lead Channel Setting Pacing Amplitude: 2.5 V
Lead Channel Setting Pacing Pulse Width: 0.4 ms
Lead Channel Setting Sensing Sensitivity: 2 mV
MDC IDC LEAD IMPLANT DT: 20160503
MDC IDC LEAD LOCATION: 753859
MDC IDC LEAD LOCATION: 753860
MDC IDC MSMT LEADCHNL RA PACING THRESHOLD PULSEWIDTH: 0.4 ms
MDC IDC MSMT LEADCHNL RV IMPEDANCE VALUE: 440 Ohm
MDC IDC MSMT LEADCHNL RV SENSING INTR AMPL: 12 mV
MDC IDC PG SERIAL: 7766577
MDC IDC SET LEADCHNL RA PACING AMPLITUDE: 1.75 V
MDC IDC STAT BRADY AP VP PERCENT: 1 %
MDC IDC STAT BRADY AS VP PERCENT: 1 %
MDC IDC STAT BRADY RA PERCENT PACED: 52 %
MDC IDC STAT BRADY RV PERCENT PACED: 1 %

## 2017-07-03 ENCOUNTER — Ambulatory Visit: Payer: Medicare Other | Admitting: Physical Therapy

## 2017-07-03 ENCOUNTER — Ambulatory Visit (INDEPENDENT_AMBULATORY_CARE_PROVIDER_SITE_OTHER)
Admission: RE | Admit: 2017-07-03 | Discharge: 2017-07-03 | Disposition: A | Discharge status: Home / Self Care | Payer: Medicare Other | Source: Ambulatory Visit | Attending: Family Medicine | Admitting: Family Medicine

## 2017-07-03 ENCOUNTER — Ambulatory Visit (INDEPENDENT_AMBULATORY_CARE_PROVIDER_SITE_OTHER): Payer: Medicare Other | Admitting: Family Medicine

## 2017-07-03 ENCOUNTER — Encounter: Payer: Self-pay | Admitting: Family Medicine

## 2017-07-03 VITALS — BP 122/60 | HR 75 | Temp 98.2°F | Wt 123.0 lb

## 2017-07-03 DIAGNOSIS — S4991XA Unspecified injury of right shoulder and upper arm, initial encounter: Secondary | ICD-10-CM

## 2017-07-03 DIAGNOSIS — S42451D Displaced fracture of lateral condyle of right humerus, subsequent encounter for fracture with routine healing: Secondary | ICD-10-CM | POA: Insufficient documentation

## 2017-07-03 DIAGNOSIS — R413 Other amnesia: Secondary | ICD-10-CM

## 2017-07-03 DIAGNOSIS — W19XXXA Unspecified fall, initial encounter: Secondary | ICD-10-CM

## 2017-07-03 DIAGNOSIS — M25421 Effusion, right elbow: Secondary | ICD-10-CM | POA: Diagnosis not present

## 2017-07-03 DIAGNOSIS — Y92009 Unspecified place in unspecified non-institutional (private) residence as the place of occurrence of the external cause: Secondary | ICD-10-CM

## 2017-07-03 DIAGNOSIS — R269 Unspecified abnormalities of gait and mobility: Secondary | ICD-10-CM | POA: Diagnosis not present

## 2017-07-03 NOTE — Assessment & Plan Note (Addendum)
Fall suffered last night with resultant R elbow/forearm injury. Given some limitation in ROM of elbow, will check xrays to r/o fracture. Doubt forearm fracture. Supportive care for now - tylenol PRN. Update if not improving with time.  See below for fall prevention recommendations.  Xray with possible displaced radial head fracture - will place in sling, await formal radiology read, and likely refer to ortho. Pt and daughter agree with plan.

## 2017-07-03 NOTE — Progress Notes (Signed)
BP 122/60 (BP Location: Left Arm, Patient Position: Sitting, Cuff Size: Normal)   Pulse 75   Temp 98.2 F (36.8 C) (Oral)   Wt 123 lb (55.8 kg)   SpO2 97%   BMI 24.02 kg/m    CC: arm injury after fall Subjective:    Patient ID: Royetta Crochet, female    DOB: 06/25/1928, 84 y.o.   MRN: [id number]  HPI: REET SCHARRER is a 84 y.o. female presenting on 07/03/2017 for Fall [place] yesterday evening at home. Bent over in the yard and fell face forward. Was not using walker. Per pt's daughter, [name], pt has fallen x2 in last 2 mos. Says the previous fall happened the same way doing the same thing after being told not to by PT and herself. ) and Arm Injury (Injured right elbow, wrist and right knee. Also, has injury to chin. Has taken either Aleve or Tramadol)   Here with daughter today.   Fall last evening at home in yard - fell forward onto face after losing balance while walking in the yard picking up some tall grass. Landed on R forearm and has some bruising, painful with movement and unable to fully extend arm at elbow. Also hit knee and chin - with abrasions to both areas, no significant pain at these areas. No lightheadedness or dizziness. "I always fall forward".   Doesn't regularly use walker - wasn't using yesterday.  Just completed fall prevention program through outpatient PT. She did not follow HEP or listen to advice given - according to her daughter. Has not been doing home exercises.  3rd fall in the past 2 months.   H/o alz and vascular dementia per daughter Lives alone. Daughter is neighbor.   Relevant past medical, surgical, family and social history reviewed and updated as indicated. Interim medical history since our last visit reviewed. Allergies and medications reviewed and updated. Outpatient Medications Prior to Visit  Medication Sig Dispense Refill  . aspirin 81 MG tablet Take 81 mg by mouth daily.     Marland Kitchen augmented betamethasone dipropionate (DIPROLENE-AF) 0.05 %  ointment Apply 1 application topically as needed.     . cholecalciferol (VITAMIN D) 1000 units tablet Take 1,000 Units by mouth daily.    . cyanocobalamin 1000 MCG tablet Take 1,000 mcg by mouth daily.    Marland Kitchen donepezil (ARICEPT) 10 MG tablet Take 1 tablet (10 mg total) by mouth at bedtime. 90 tablet 3  . fluticasone (FLONASE) 50 MCG/ACT nasal spray Place 2 sprays into both nostrils daily as needed for allergies or rhinitis. 16 g prn  . levothyroxine (SYNTHROID, LEVOTHROID) 25 MCG tablet TAKE 1 TABLET BY MOUTH  DAILY BEFORE BREAKFAST 90 tablet 3  . loratadine (CLARITIN) 10 MG tablet Take 1 tablet (10 mg total) by mouth daily.    Marland Kitchen losartan (COZAAR) 50 MG tablet Take 1 tablet (50 mg total) by mouth daily. 90 tablet 3  . Melatonin 3 MG TABS Take 3-6 mg by mouth at bedtime as needed.    . naproxen sodium (RA NAPROXEN SODIUM) 220 MG tablet Take by mouth as needed.     . nitroGLYCERIN (NITROSTAT) 0.4 MG SL tablet Place 1 tablet (0.4 mg total) under the tongue every 5 (five) minutes as needed. 25 tablet 12  . omeprazole (PRILOSEC) 40 MG capsule TAKE 1 CAPSULE BY MOUTH  DAILY AS NEEDED 90 capsule 0  . simvastatin (ZOCOR) 20 MG tablet TAKE 1 TABLET BY MOUTH AT  BEDTIME 90 tablet 1  .  traMADol (ULTRAM) 50 MG tablet Take 1 tablet (50 mg total) by mouth 3 (three) times daily as needed. 90 tablet 1   No facility-administered medications prior to visit.      Per HPI unless specifically indicated in ROS section below Review of Systems     Objective:    BP 122/60 (BP Location: Left Arm, Patient Position: Sitting, Cuff Size: Normal)   Pulse 75   Temp 98.2 F (36.8 C) (Oral)   Wt 123 lb (55.8 kg)   SpO2 97%   BMI 24.02 kg/m   Wt Readings from Last 3 Encounters:  07/03/17 123 lb (55.8 kg)  04/09/17 119 lb (54 kg)  03/14/17 117 lb 2 oz (53.1 kg)    Physical Exam  Constitutional: She appears well-developed and well-nourished. No distress.  Musculoskeletal: She exhibits edema (R elbow).  2+ rad  pulses L arm WNL R arm: Slightly limited ROM in full extension at R elbow, pain anterior and posterior elbow with full flexion Tender to palpation medial forearm along ulna without frank point tenderness.  No pain at carpals, preserved ROM at R wrist  Skin: Skin is warm and dry. Bruising and ecchymosis noted.  Small abrasion R chin Small abrasion R knee with surrounding ecchymosis Bruising of R medial forearm  Nursing note and vitals reviewed.     Assessment & Plan:   Problem List Items Addressed This Visit    Arm injury, right, initial encounter - Primary    Fall suffered last night with resultant R elbow/forearm injury. Given some limitation in ROM of elbow, will check xrays to r/o fracture. Doubt forearm fracture. Supportive care for now - tylenol PRN. Update if not improving with time.  See below for fall prevention recommendations.  Xray with possible displaced radial head fracture - will place in sling, await formal radiology read, and likely refer to ortho. Pt and daughter agree with plan.       Relevant Orders   DG Forearm Right   DG Elbow Complete Right   Fall at home    Recurrent fall - 3rd in the past 2 months, despite just completing fall prevention program. She has not been following PT recommendations and daughter is frustrated.  Again emphasized importance of regular walker use (recommended by PT), discussed risks of falls including major bone fracture which can be life threatening.  Discussed concerns with home safety if recurrent falls at home. Suggested they look into life alert system.       Gait abnormality   Memory loss       Follow up plan: Return if symptoms worsen or fail to improve.  Ria Bush, MD

## 2017-07-03 NOTE — Assessment & Plan Note (Addendum)
Recurrent fall - 3rd in the past 2 months, despite just completing fall prevention program. She has not been following PT recommendations and daughter is frustrated.  Again emphasized importance of regular walker use (recommended by PT), discussed risks of falls including major bone fracture which can be life threatening.  Discussed concerns with home safety if recurrent falls at home. Suggested they look into life alert system.

## 2017-07-03 NOTE — Patient Instructions (Addendum)
I am worried about elbow fracture - use sling starting now. We will await radiology read of xray and may refer you to orthopedist tomorrow. Use walker regularly. Do home exercise program recommended by physical therapy. Consider life alert system. Continue tylenol as needed for pain.  Good to see you today, call us with questions.   Fall Prevention in the Home Falls can cause injuries. They can happen to people of all ages. There are many things you can do to make your home safe and to help prevent falls. What can I do on the outside of my home?  Regularly fix the edges of walkways and driveways and fix any cracks.  Remove anything that might make you trip as you walk through a door, such as a raised step or threshold.  Trim any bushes or trees on the path to your home.  Use bright outdoor lighting.  Clear any walking paths of anything that might make someone trip, such as rocks or tools.  Regularly check to see if handrails are loose or broken. Make sure that both sides of any steps have handrails.  Any raised decks and porches should have guardrails on the edges.  Have any leaves, snow, or ice cleared regularly.  Use sand or salt on walking paths during winter.  Clean up any spills in your garage right away. This includes oil or grease spills. What can I do in the bathroom?  Use night lights.  Install grab bars by the toilet and in the tub and shower. Do not use towel bars as grab bars.  Use non-skid mats or decals in the tub or shower.  If you need to sit down in the shower, use a plastic, non-slip stool.  Keep the floor dry. Clean up any water that spills on the floor as soon as it happens.  Remove soap buildup in the tub or shower regularly.  Attach bath mats securely with double-sided non-slip rug tape.  Do not have throw rugs and other things on the floor that can make you trip. What can I do in the bedroom?  Use night lights.  Make sure that you have a light  by your bed that is easy to reach.  Do not use any sheets or blankets that are too big for your bed. They should not hang down onto the floor.  Have a firm chair that has side arms. You can use this for support while you get dressed.  Do not have throw rugs and other things on the floor that can make you trip. What can I do in the kitchen?  Clean up any spills right away.  Avoid walking on wet floors.  Keep items that you use a lot in easy-to-reach places.  If you need to reach something above you, use a strong step stool that has a grab bar.  Keep electrical cords out of the way.  Do not use floor polish or wax that makes floors slippery. If you must use wax, use non-skid floor wax.  Do not have throw rugs and other things on the floor that can make you trip. What can I do with my stairs?  Do not leave any items on the stairs.  Make sure that there are handrails on both sides of the stairs and use them. Fix handrails that are broken or loose. Make sure that handrails are as long as the stairways.  Check any carpeting to make sure that it is firmly attached to the stairs. Fix  any carpet that is loose or worn.  Avoid having throw rugs at the top or bottom of the stairs. If you do have throw rugs, attach them to the floor with carpet tape.  Make sure that you have a light switch at the top of the stairs and the bottom of the stairs. If you do not have them, ask someone to add them for you. What else can I do to help prevent falls?  Wear shoes that: ? Do not have high heels. ? Have rubber bottoms. ? Are comfortable and fit you well. ? Are closed at the toe. Do not wear sandals.  If you use a stepladder: ? Make sure that it is fully opened. Do not climb a closed stepladder. ? Make sure that both sides of the stepladder are locked into place. ? Ask someone to hold it for you, if possible.  Clearly mark and make sure that you can see: ? Any grab bars or handrails. ? First  and last steps. ? Where the edge of each step is.  Use tools that help you move around (mobility aids) if they are needed. These include: ? Canes. ? Walkers. ? Scooters. ? Crutches.  Turn on the lights when you go into a dark area. Replace any light bulbs as soon as they burn out.  Set up your furniture so you have a clear path. Avoid moving your furniture around.  If any of your floors are uneven, fix them.  If there are any pets around you, be aware of where they are.  Review your medicines with your doctor. Some medicines can make you feel dizzy. This can increase your chance of falling. Ask your doctor what other things that you can do to help prevent falls. This information is not intended to replace advice given to you by your health care provider. Make sure you discuss any questions you have with your health care provider. Document Released: 04/07/2009 Document Revised: 11/17/2015 Document Reviewed: 07/16/2014 Elsevier Interactive Patient Education  [employer].

## 2017-07-04 ENCOUNTER — Other Ambulatory Visit: Payer: Self-pay | Admitting: Family Medicine

## 2017-07-04 DIAGNOSIS — S4991XA Unspecified injury of right shoulder and upper arm, initial encounter: Secondary | ICD-10-CM

## 2017-07-04 DIAGNOSIS — S52121A Displaced fracture of head of right radius, initial encounter for closed fracture: Secondary | ICD-10-CM

## 2017-07-05 DIAGNOSIS — S72421A Displaced fracture of lateral condyle of right femur, initial encounter for closed fracture: Secondary | ICD-10-CM | POA: Diagnosis not present

## 2017-07-18 DIAGNOSIS — S59801A Other specified injuries of right elbow, initial encounter: Secondary | ICD-10-CM | POA: Diagnosis not present

## 2017-07-18 DIAGNOSIS — M25521 Pain in right elbow: Secondary | ICD-10-CM | POA: Diagnosis not present

## 2017-07-18 DIAGNOSIS — S42451A Displaced fracture of lateral condyle of right humerus, initial encounter for closed fracture: Secondary | ICD-10-CM | POA: Diagnosis not present

## 2017-08-14 DIAGNOSIS — M25521 Pain in right elbow: Secondary | ICD-10-CM | POA: Diagnosis not present

## 2017-08-14 DIAGNOSIS — M79632 Pain in left forearm: Secondary | ICD-10-CM | POA: Diagnosis not present

## 2017-08-14 DIAGNOSIS — S59801D Other specified injuries of right elbow, subsequent encounter: Secondary | ICD-10-CM | POA: Diagnosis not present

## 2017-08-14 DIAGNOSIS — S59801A Other specified injuries of right elbow, initial encounter: Secondary | ICD-10-CM | POA: Diagnosis not present

## 2017-08-23 ENCOUNTER — Encounter: Payer: Self-pay | Admitting: Family Medicine

## 2017-08-23 ENCOUNTER — Ambulatory Visit (INDEPENDENT_AMBULATORY_CARE_PROVIDER_SITE_OTHER): Payer: Medicare Other | Admitting: Family Medicine

## 2017-08-23 VITALS — BP 132/64 | HR 73 | Temp 98.4°F | Wt 123.0 lb

## 2017-08-23 DIAGNOSIS — J22 Unspecified acute lower respiratory infection: Secondary | ICD-10-CM | POA: Insufficient documentation

## 2017-08-23 DIAGNOSIS — S42451D Displaced fracture of lateral condyle of right humerus, subsequent encounter for fracture with routine healing: Secondary | ICD-10-CM | POA: Diagnosis not present

## 2017-08-23 MED ORDER — AMOXICILLIN 875 MG PO TABS: 875.0000 mg | ORAL_TABLET | Freq: Two times a day (BID) | ORAL | 0 refills | Status: DC

## 2017-08-23 NOTE — Progress Notes (Addendum)
BP 132/64 (BP Location: Left Arm, Patient Position: Sitting, Cuff Size: Normal)   Pulse 73   Temp 98.4 F (36.9 C) (Oral)   Wt 123 lb (55.8 kg)   SpO2 96%   BMI 24.02 kg/m    CC: "I've got this old cold" Subjective:    Patient ID: [name], female    DOB: 19-May-1928, 84 y.o.   MRN: [id number]  HPI: [name] is a 84 y.o. female presenting on 08/23/2017 for URI (Productive cough, sneezing, nasal congestion and runny nose. Denies any fever, N/V/D. Started about 2 wks ago. Taking Claritin and Flonase. Per daughter, pt has sniffles all the time. Wants to know if Claritin and Flonase should be continued. Pt accompanied by daughter, [name]. )   2 wk h/o productive cough, sneezing, rhinorrhea and chest > head congestion, hoarseness. Mild headache. Mild PNDrainage. Not improving.   No fevers/chills, ST, nausea.  No sick contacts at home. No smokers at home.  No h/o asthma.  She regularly takes claritin and flonase. She has been taking tylenol recently.   Suffered fall at home 06/2017 with resultant closed displaced fracture of lateral R condyle of elbow - referred to Dr Amedeo Plenty. No surgery, conservative management.   Relevant past medical, surgical, family and social history reviewed and updated as indicated. Interim medical history since our last visit reviewed. Allergies and medications reviewed and updated. Outpatient Medications Prior to Visit  Medication Sig Dispense Refill  . aspirin 81 MG tablet Take 81 mg by mouth daily.     Marland Kitchen augmented betamethasone dipropionate (DIPROLENE-AF) 0.05 % ointment Apply 1 application topically as needed.     . cholecalciferol (VITAMIN D) 1000 units tablet Take 1,000 Units by mouth daily.    . cyanocobalamin 1000 MCG tablet Take 1,000 mcg by mouth daily.    Marland Kitchen donepezil (ARICEPT) 10 MG tablet Take 1 tablet (10 mg total) by mouth at bedtime. 90 tablet 3  . fluticasone (FLONASE) 50 MCG/ACT nasal spray Place 2 sprays into both nostrils daily as  needed for allergies or rhinitis. 16 g prn  . levothyroxine (SYNTHROID, LEVOTHROID) 25 MCG tablet TAKE 1 TABLET BY MOUTH  DAILY BEFORE BREAKFAST 90 tablet 3  . loratadine (CLARITIN) 10 MG tablet Take 1 tablet (10 mg total) by mouth daily.    Marland Kitchen losartan (COZAAR) 50 MG tablet Take 1 tablet (50 mg total) by mouth daily. 90 tablet 3  . Melatonin 3 MG TABS Take 3-6 mg by mouth at bedtime as needed.    . naproxen sodium (RA NAPROXEN SODIUM) 220 MG tablet Take by mouth as needed.     . nitroGLYCERIN (NITROSTAT) 0.4 MG SL tablet Place 1 tablet (0.4 mg total) under the tongue every 5 (five) minutes as needed. 25 tablet 12  . omeprazole (PRILOSEC) 40 MG capsule TAKE 1 CAPSULE BY MOUTH  DAILY AS NEEDED 90 capsule 0  . simvastatin (ZOCOR) 20 MG tablet TAKE 1 TABLET BY MOUTH AT  BEDTIME 90 tablet 1  . traMADol (ULTRAM) 50 MG tablet Take 1 tablet (50 mg total) by mouth 3 (three) times daily as needed. 90 tablet 1   No facility-administered medications prior to visit.      Per HPI unless specifically indicated in ROS section below Review of Systems     Objective:    BP 132/64 (BP Location: Left Arm, Patient Position: Sitting, Cuff Size: Normal)   Pulse 73   Temp 98.4 F (36.9 C) (Oral)   Wt 123 lb (  55.8 kg)   SpO2 96%   BMI 24.02 kg/m   Wt Readings from Last 3 Encounters:  08/23/17 123 lb (55.8 kg)  07/03/17 123 lb (55.8 kg)  04/09/17 119 lb (54 kg)    Physical Exam  Constitutional: She appears well-developed and well-nourished. No distress.  HENT:  Head: Normocephalic and atraumatic.  Right Ear: Hearing, tympanic membrane, external ear and ear canal normal.  Left Ear: Hearing, tympanic membrane, external ear and ear canal normal.  Nose: Mucosal edema and rhinorrhea present. Right sinus exhibits no maxillary sinus tenderness and no frontal sinus tenderness. Left sinus exhibits no maxillary sinus tenderness and no frontal sinus tenderness.  Mouth/Throat: Uvula is midline, oropharynx is clear  and moist and mucous membranes are normal. No oropharyngeal exudate, posterior oropharyngeal edema, posterior oropharyngeal erythema or tonsillar abscesses.  Eyes: Conjunctivae and EOM are normal. Pupils are equal, round, and reactive to light. No scleral icterus.  Neck: Normal range of motion. Neck supple.  Cardiovascular: Normal rate, regular rhythm and intact distal pulses.  Murmur (3/6 systolic) heard. Pulmonary/Chest: Effort normal and breath sounds normal. No respiratory distress. She has no wheezes. She has no rales.  Lymphadenopathy:    She has no cervical adenopathy.  Skin: Skin is warm and dry. No rash noted.  Nursing note and vitals reviewed.  Lab Results  Component Value Date   CREATININE 1.07 01/24/2017   BUN 18 01/24/2017   NA 139 01/24/2017   K 4.2 01/24/2017   CL 105 01/24/2017   CO2 25 01/24/2017       Assessment & Plan:   Problem List Items Addressed This Visit    Acute respiratory infection - Primary    Symptoms ongoing 2 wks. Given duration and age/comorbidities, will treat aggressively with antibiotic course - amoxicillin 875mg  bid x 7 days.  Further supportive measures reviewed.  Red flags to seek further care reviewed.      Displaced fracture of lateral condyle of right humerus with routine healing    Was recommended against surgery - healing well.           Meds ordered this encounter  Medications  . amoxicillin (AMOXIL) 875 MG tablet    Sig: Take 1 tablet (875 mg total) by mouth 2 (two) times daily.    Dispense:  14 tablet    Refill:  0   No orders of the defined types were placed in this encounter.   Follow up plan: Return if symptoms worsen or fail to improve.  Ria Bush, MD

## 2017-08-23 NOTE — Patient Instructions (Signed)
We will treat your respiratory infection with antibiotic given prolonged symptoms.  Push fluids and rest.  May continue claritin and flonase May take delsym or robitussin over the counter to help suppress cough as needed Let us know if fever >101, worsening productive cough, or not improving with treatment.

## 2017-08-23 NOTE — Assessment & Plan Note (Signed)
Symptoms ongoing 2 wks. Given duration and age/comorbidities, will treat aggressively with antibiotic course - amoxicillin 875mg  bid x 7 days.  Further supportive measures reviewed.  Red flags to seek further care reviewed.

## 2017-08-23 NOTE — Assessment & Plan Note (Signed)
Was recommended against surgery - healing well.

## 2017-09-03 ENCOUNTER — Ambulatory Visit (INDEPENDENT_AMBULATORY_CARE_PROVIDER_SITE_OTHER): Payer: Medicare Other | Admitting: *Deleted

## 2017-09-03 DIAGNOSIS — I495 Sick sinus syndrome: Secondary | ICD-10-CM | POA: Diagnosis not present

## 2017-09-03 NOTE — Progress Notes (Signed)
Remote pacemaker transmission.   

## 2017-09-04 ENCOUNTER — Encounter: Payer: Self-pay | Admitting: Cardiology

## 2017-09-21 LAB — CUP PACEART REMOTE DEVICE CHECK
Battery Remaining Longevity: 122 mo
Battery Remaining Percentage: 95.5 %
Battery Voltage: 3.01 V
Brady Statistic AS VS Percent: 46 %
Implantable Lead Implant Date: 20080331
Implantable Pulse Generator Implant Date: 20160503
Lead Channel Impedance Value: 340 Ohm
Lead Channel Impedance Value: 410 Ohm
Lead Channel Pacing Threshold Amplitude: 0.75 V
Lead Channel Pacing Threshold Pulse Width: 0.4 ms
Lead Channel Sensing Intrinsic Amplitude: 0.8 mV
Lead Channel Setting Pacing Amplitude: 2.5 V
Lead Channel Setting Sensing Sensitivity: 2 mV
MDC IDC LEAD IMPLANT DT: 20160503
MDC IDC LEAD LOCATION: 753859
MDC IDC LEAD LOCATION: 753860
MDC IDC MSMT LEADCHNL RA PACING THRESHOLD PULSEWIDTH: 0.4 ms
MDC IDC MSMT LEADCHNL RV PACING THRESHOLD AMPLITUDE: 1.25 V
MDC IDC MSMT LEADCHNL RV SENSING INTR AMPL: 12 mV
MDC IDC PG SERIAL: 7766577
MDC IDC SESS DTM: 20190312060608
MDC IDC SET LEADCHNL RA PACING AMPLITUDE: 1.75 V
MDC IDC SET LEADCHNL RV PACING PULSEWIDTH: 0.4 ms
MDC IDC STAT BRADY AP VP PERCENT: 1 %
MDC IDC STAT BRADY AP VS PERCENT: 53 %
MDC IDC STAT BRADY AS VP PERCENT: 1 %
MDC IDC STAT BRADY RA PERCENT PACED: 53 %
MDC IDC STAT BRADY RV PERCENT PACED: 1 %

## 2017-12-03 ENCOUNTER — Ambulatory Visit (INDEPENDENT_AMBULATORY_CARE_PROVIDER_SITE_OTHER): Payer: Medicare Other | Admitting: *Deleted

## 2017-12-03 DIAGNOSIS — I495 Sick sinus syndrome: Secondary | ICD-10-CM | POA: Diagnosis not present

## 2017-12-03 NOTE — Progress Notes (Signed)
Remote pacemaker transmission.   

## 2017-12-04 ENCOUNTER — Encounter: Payer: Self-pay | Admitting: Cardiology

## 2017-12-11 LAB — CUP PACEART REMOTE DEVICE CHECK
Battery Remaining Longevity: 122 mo
Battery Remaining Percentage: 95.5 %
Battery Voltage: 3.01 V
Brady Statistic AP VS Percent: 53 %
Brady Statistic RA Percent Paced: 52 %
Brady Statistic RV Percent Paced: 1 %
Date Time Interrogation Session: 20190611060026
Implantable Lead Implant Date: 20080331
Implantable Lead Location: 753859
Implantable Lead Location: 753860
Implantable Pulse Generator Implant Date: 20160503
Lead Channel Impedance Value: 340 Ohm
Lead Channel Pacing Threshold Amplitude: 0.75 V
Lead Channel Pacing Threshold Amplitude: 1.25 V
Lead Channel Pacing Threshold Pulse Width: 0.4 ms
Lead Channel Sensing Intrinsic Amplitude: 1.9 mV
Lead Channel Setting Pacing Pulse Width: 0.4 ms
Lead Channel Setting Sensing Sensitivity: 2 mV
MDC IDC LEAD IMPLANT DT: 20160503
MDC IDC MSMT LEADCHNL RV IMPEDANCE VALUE: 430 Ohm
MDC IDC MSMT LEADCHNL RV PACING THRESHOLD PULSEWIDTH: 0.4 ms
MDC IDC MSMT LEADCHNL RV SENSING INTR AMPL: 12 mV
MDC IDC SET LEADCHNL RA PACING AMPLITUDE: 1.75 V
MDC IDC SET LEADCHNL RV PACING AMPLITUDE: 2.5 V
MDC IDC STAT BRADY AP VP PERCENT: 1 %
MDC IDC STAT BRADY AS VP PERCENT: 1 %
MDC IDC STAT BRADY AS VS PERCENT: 47 %
Pulse Gen Model: 2240
Pulse Gen Serial Number: 7766577

## 2017-12-13 ENCOUNTER — Other Ambulatory Visit: Payer: Self-pay | Admitting: Family Medicine

## 2017-12-17 ENCOUNTER — Encounter: Payer: Self-pay | Admitting: Internal Medicine

## 2017-12-17 ENCOUNTER — Ambulatory Visit (INDEPENDENT_AMBULATORY_CARE_PROVIDER_SITE_OTHER): Payer: Medicare Other | Admitting: Internal Medicine

## 2017-12-17 VITALS — BP 140/58 | HR 60 | Ht <= 58 in | Wt 126.5 lb

## 2017-12-17 DIAGNOSIS — Z950 Presence of cardiac pacemaker: Secondary | ICD-10-CM | POA: Diagnosis not present

## 2017-12-17 DIAGNOSIS — I495 Sick sinus syndrome: Secondary | ICD-10-CM

## 2017-12-17 NOTE — Progress Notes (Signed)
Patient Care Team: Tonia Ghent, MD as PCP - General (Family Medicine) Estill Cotta, MD as Consulting Physician (Ophthalmology) Ronald Lobo, MD as Consulting Physician (Gastroenterology) Dasher, Rayvon Char, MD as Consulting Physician (Dermatology)   HPI  [name] is a 84 y.o. female Previously followed by Dr. Thermon Leyland with a pacemaker implanted for sinus node dysfunction and a history of coronary artery disease. She has a history of stenting to her RCA 2008 in the context of an inferior wall MI.  She has a history of atrial fibrillation remotely but none documented of late and is not on anticoagulation  Echo EF 2014 normal aortic sclerosis without stenosis Myoview 2016 no ischemia  Records and Results Reviewed  Date Cr K  8/18 1.07 4.2          The patient denies chest pain, shortness of breath, nocturnal dyspnea, orthopnea or peripheral edema.  There have been no palpitations, lightheadedness or syncope.   She is bored and lonely  Past Medical History:  Diagnosis Date  . Allergy   . Arthritis   . CAD (coronary artery disease)   . Fatty liver    on u/s 2010  . Heart murmur   . Hyperlipidemia   . Hypertension   . Insomnia   . Myocardial infarction ([place])   . Osteoporosis    dxa 2013  . PAF (paroxysmal atrial fibrillation) ([place])   . SSS (sick sinus syndrome) ([place])    St. Jude Permanent Pacemaker 09/23/06  . Symptomatic bradycardia     Past Surgical History:  Procedure Laterality Date  . BACK SURGERY    . CARPAL TUNNEL RELEASE     left  . CHOLECYSTECTOMY    . CORONARY ANGIOPLASTY WITH STENT PLACEMENT  12/03/06   Stent to distal RCA  . CORONARY ANGIOPLASTY WITH STENT PLACEMENT  09/12/06   Stenting mid distal segment & PTCA alone distal RCA prox. to the posterior descending artery takeoff.  . CORONARY STENT PLACEMENT     RCA stent replaced   . EP IMPLANTABLE DEVICE N/A 10/26/2014   Procedure: PPM Generator Changeout;  Surgeon: Sanda Kellianne Ek, MD;   Location: [facility];  Service: Cardiovascular;  Laterality: N/A;  . EP IMPLANTABLE DEVICE N/A 10/26/2014   Procedure: Pacemaker Revision- new RV Lead;  Surgeon: Sanda Correne Lalani, MD;  Location: [facility];  Service: Cardiovascular;  Laterality: N/A;  . FOOT SURGERY     Left  . HERNIA REPAIR     R inguinal  . PACEMAKER INSERTION  09/23/06   St.Jude  . US ECHOCARDIOGRAPHY  11/26/11   prox. septal thickening, mild concentric LVH, mod. ca+ AOV,trace AI,mild to mod valvular aortic stenosis.    Current Outpatient Medications  Medication Sig Dispense Refill  . Acetaminophen (TYLENOL 8 HOUR PO) Take by mouth as needed.    Marland Kitchen aspirin 81 MG tablet Take 81 mg by mouth daily.     Marland Kitchen augmented betamethasone dipropionate (DIPROLENE-AF) 0.05 % ointment Apply 1 application topically as needed.     . cholecalciferol (VITAMIN D) 1000 units tablet Take 1,000 Units by mouth daily.    . cyanocobalamin 1000 MCG tablet Take 1,000 mcg by mouth daily.    Marland Kitchen donepezil (ARICEPT) 10 MG tablet Take 1 tablet (10 mg total) by mouth at bedtime. 90 tablet 3  . fluticasone (FLONASE) 50 MCG/ACT nasal spray Place 2 sprays into both nostrils daily as needed for allergies or rhinitis. 16 g prn  . levothyroxine (SYNTHROID,  LEVOTHROID) 25 MCG tablet TAKE 1 TABLET BY MOUTH  DAILY BEFORE BREAKFAST 90 tablet 3  . loratadine (CLARITIN) 10 MG tablet Take 1 tablet (10 mg total) by mouth daily.    Marland Kitchen losartan (COZAAR) 50 MG tablet Take 1 tablet (50 mg total) by mouth daily. 90 tablet 3  . Melatonin 3 MG TABS Take 3-6 mg by mouth at bedtime as needed.    . naproxen sodium (RA NAPROXEN SODIUM) 220 MG tablet Take by mouth as needed.     . nitroGLYCERIN (NITROSTAT) 0.4 MG SL tablet Place 1 tablet (0.4 mg total) under the tongue every 5 (five) minutes as needed. 25 tablet 12  . omeprazole (PRILOSEC) 40 MG capsule TAKE 1 CAPSULE BY MOUTH  DAILY AS NEEDED 90 capsule 0  . simvastatin (ZOCOR) 20 MG tablet TAKE 1 TABLET BY MOUTH AT   BEDTIME 90 tablet 1  . traMADol (ULTRAM) 50 MG tablet Take 1 tablet (50 mg total) by mouth 3 (three) times daily as needed. 90 tablet 1   No current facility-administered medications for this visit.     Allergies  Allergen Reactions  . Fosamax [Alendronate Sodium] Other (See Comments)    Anxious and "I just didn't feel right" after each dose; 2.5 month trial      Review of Systems negative except from HPI and PMH  Physical Exam BP (!) 140/58 (BP Location: Left Arm, Patient Position: Sitting, Cuff Size: Normal)   Pulse 60   Ht 4\' 9"  (1.448 m)   Wt 126 lb 8 oz (57.4 kg)   BMI 27.37 kg/m  Well developed and nourished in no acute distress HENT normal Neck supple with JVP-flat  carotids brisk  Clear Regular rate and rhythm, 2/6 m Abd-soft with active BS No Clubbing cyanosis edema Skin-warm and dry A & Oriented  Grossly normal sensory and motor function  ECG A paced @ 60 05/31/41   Assessment and  Plan  Coronary artery disease prior stenting  Pacemaker-St. Jude  Sinus node dysfunction   Without symptoms of ischemia  Euvolemic continue current meds  Continue current meds  Invited her to come out at the office if she gets too bored    Current medicines are reviewed at length with the patient today .  The patient does  Have not  concerns regarding medicines.

## 2017-12-17 NOTE — Patient Instructions (Signed)

## 2018-02-03 DIAGNOSIS — M461 Sacroiliitis, not elsewhere classified: Secondary | ICD-10-CM | POA: Diagnosis not present

## 2018-02-03 DIAGNOSIS — M9903 Segmental and somatic dysfunction of lumbar region: Secondary | ICD-10-CM | POA: Diagnosis not present

## 2018-02-03 DIAGNOSIS — M545 Low back pain: Secondary | ICD-10-CM | POA: Diagnosis not present

## 2018-02-03 DIAGNOSIS — M9904 Segmental and somatic dysfunction of sacral region: Secondary | ICD-10-CM | POA: Diagnosis not present

## 2018-02-06 DIAGNOSIS — M461 Sacroiliitis, not elsewhere classified: Secondary | ICD-10-CM | POA: Diagnosis not present

## 2018-02-06 DIAGNOSIS — M9904 Segmental and somatic dysfunction of sacral region: Secondary | ICD-10-CM | POA: Diagnosis not present

## 2018-02-06 DIAGNOSIS — M545 Low back pain: Secondary | ICD-10-CM | POA: Diagnosis not present

## 2018-02-06 DIAGNOSIS — M9903 Segmental and somatic dysfunction of lumbar region: Secondary | ICD-10-CM | POA: Diagnosis not present

## 2018-02-11 DIAGNOSIS — M9904 Segmental and somatic dysfunction of sacral region: Secondary | ICD-10-CM | POA: Diagnosis not present

## 2018-02-11 DIAGNOSIS — M461 Sacroiliitis, not elsewhere classified: Secondary | ICD-10-CM | POA: Diagnosis not present

## 2018-02-11 DIAGNOSIS — M545 Low back pain: Secondary | ICD-10-CM | POA: Diagnosis not present

## 2018-02-11 DIAGNOSIS — M9903 Segmental and somatic dysfunction of lumbar region: Secondary | ICD-10-CM | POA: Diagnosis not present

## 2018-02-13 ENCOUNTER — Telehealth: Payer: Self-pay | Admitting: Family Medicine

## 2018-02-13 NOTE — Telephone Encounter (Signed)
Copied from [place] [id number]. Topic: Quick Communication - Rx Refill/Question >> Feb 13, 2018  9:25 AM Reyne Dumas L wrote: Medication: simvastatin (ZOCOR) 20 MG tablet  Pt's daughter, [name], calling in.  She states the bottle states that pt should take this medication at night.  Pt's daughter wants to know if there is a reason this must be taken at night or can pt take it in the morning. The main reason [name] is asking is because pt takes morning and night medicine and it would be easier on pt an daughter if medication could be taken in the morning with the rest of things.   Pt's daughter reports that pt often forgets night medicine. [name] can be reached at [phone]

## 2018-02-13 NOTE — Telephone Encounter (Signed)
I had read that simvastatin taken at night has a greater reduction in LDL than if taken in morning. What do you want pt to do? Thank you.

## 2018-02-14 NOTE — Telephone Encounter (Signed)
You are right.  Med acts by blocking the enzyme that controls cholesterol production in the liver.  Recommend that they are taken at night, since most cholesterol is produced when dietary intake is at its lowest (meaning at night).  That said, if the patient has trouble taking it at night, she is better off being on the med in the AM than not at all.  Thanks.

## 2018-02-14 NOTE — Telephone Encounter (Signed)
Left detailed message on voicemail.  

## 2018-02-25 DIAGNOSIS — H2513 Age-related nuclear cataract, bilateral: Secondary | ICD-10-CM | POA: Diagnosis not present

## 2018-02-28 ENCOUNTER — Other Ambulatory Visit: Payer: Self-pay | Admitting: Family Medicine

## 2018-03-03 LAB — CUP PACEART INCLINIC DEVICE CHECK
Battery Voltage: 3.01 V
Brady Statistic RA Percent Paced: 52 %
Brady Statistic RV Percent Paced: 0.37 %
Date Time Interrogation Session: 20190625142927
Implantable Lead Implant Date: 20080331
Implantable Lead Location: 753859
Lead Channel Impedance Value: 450 Ohm
Lead Channel Pacing Threshold Amplitude: 0.75 V
Lead Channel Pacing Threshold Pulse Width: 0.4 ms
Lead Channel Sensing Intrinsic Amplitude: 1.9 mV
Lead Channel Sensing Intrinsic Amplitude: 12 mV
MDC IDC LEAD IMPLANT DT: 20160503
MDC IDC LEAD LOCATION: 753860
MDC IDC MSMT BATTERY REMAINING LONGEVITY: 134 mo
MDC IDC MSMT LEADCHNL RA IMPEDANCE VALUE: 337.5 Ohm
MDC IDC MSMT LEADCHNL RV PACING THRESHOLD AMPLITUDE: 1.25 V
MDC IDC MSMT LEADCHNL RV PACING THRESHOLD PULSEWIDTH: 0.4 ms
MDC IDC PG IMPLANT DT: 20160503
MDC IDC SET LEADCHNL RA PACING AMPLITUDE: 1.75 V
MDC IDC SET LEADCHNL RV PACING AMPLITUDE: 2.5 V
MDC IDC SET LEADCHNL RV PACING PULSEWIDTH: 0.4 ms
MDC IDC SET LEADCHNL RV SENSING SENSITIVITY: 2 mV
Pulse Gen Model: 2240
Pulse Gen Serial Number: 7766577

## 2018-03-04 ENCOUNTER — Ambulatory Visit (INDEPENDENT_AMBULATORY_CARE_PROVIDER_SITE_OTHER): Payer: Medicare Other | Admitting: *Deleted

## 2018-03-04 DIAGNOSIS — I495 Sick sinus syndrome: Secondary | ICD-10-CM | POA: Diagnosis not present

## 2018-03-04 NOTE — Progress Notes (Signed)
Remote pacemaker transmission.   

## 2018-03-05 DIAGNOSIS — M9904 Segmental and somatic dysfunction of sacral region: Secondary | ICD-10-CM | POA: Diagnosis not present

## 2018-03-05 DIAGNOSIS — M9903 Segmental and somatic dysfunction of lumbar region: Secondary | ICD-10-CM | POA: Diagnosis not present

## 2018-03-05 DIAGNOSIS — M461 Sacroiliitis, not elsewhere classified: Secondary | ICD-10-CM | POA: Diagnosis not present

## 2018-03-05 DIAGNOSIS — M545 Low back pain: Secondary | ICD-10-CM | POA: Diagnosis not present

## 2018-03-16 ENCOUNTER — Other Ambulatory Visit: Payer: Self-pay | Admitting: Family Medicine

## 2018-03-16 DIAGNOSIS — I10 Essential (primary) hypertension: Secondary | ICD-10-CM

## 2018-03-16 DIAGNOSIS — M810 Age-related osteoporosis without current pathological fracture: Secondary | ICD-10-CM

## 2018-03-17 ENCOUNTER — Ambulatory Visit: Payer: Medicare Other

## 2018-03-19 ENCOUNTER — Ambulatory Visit (INDEPENDENT_AMBULATORY_CARE_PROVIDER_SITE_OTHER): Payer: Medicare Other

## 2018-03-19 VITALS — BP 122/64 | HR 71 | Temp 97.7°F | Ht 60.0 in | Wt 123.5 lb

## 2018-03-19 DIAGNOSIS — I10 Essential (primary) hypertension: Secondary | ICD-10-CM | POA: Diagnosis not present

## 2018-03-19 DIAGNOSIS — M810 Age-related osteoporosis without current pathological fracture: Secondary | ICD-10-CM | POA: Diagnosis not present

## 2018-03-19 DIAGNOSIS — Z0000 Encounter for general adult medical examination without abnormal findings: Secondary | ICD-10-CM

## 2018-03-19 DIAGNOSIS — Z23 Encounter for immunization: Secondary | ICD-10-CM

## 2018-03-19 LAB — COMPREHENSIVE METABOLIC PANEL
ALK PHOS: 47 U/L (ref 39–117)
ALT: 47 U/L — ABNORMAL HIGH (ref 0–35)
AST: 33 U/L (ref 0–37)
Albumin: 4.8 g/dL (ref 3.5–5.2)
BILIRUBIN TOTAL: 1.4 mg/dL — AB (ref 0.2–1.2)
BUN: 28 mg/dL — AB (ref 6–23)
CHLORIDE: 104 meq/L (ref 96–112)
CO2: 26 meq/L (ref 19–32)
CREATININE: 1.08 mg/dL (ref 0.40–1.20)
Calcium: 10.4 mg/dL (ref 8.4–10.5)
GFR: 50.81 mL/min — ABNORMAL LOW (ref >60.00)
GLUCOSE: 103 mg/dL — AB (ref 70–99)
POTASSIUM: 4.6 meq/L (ref 3.5–5.1)
SODIUM: 139 meq/L (ref 135–145)
TOTAL PROTEIN: 7.7 g/dL (ref 6.0–8.3)

## 2018-03-19 LAB — LIPID PANEL
CHOL/HDL RATIO: 4
Cholesterol: 166 mg/dL (ref 0–200)
HDL: 45.6 mg/dL (ref >39.00)
LDL Cholesterol: 84 mg/dL (ref 0–99)
NonHDL: 120.87
Triglycerides: 186 mg/dL — ABNORMAL HIGH (ref 0.0–149.0)
VLDL: 37.2 mg/dL (ref 0.0–40.0)

## 2018-03-19 LAB — TSH: TSH: 3.14 u[IU]/mL (ref 0.35–4.50)

## 2018-03-19 LAB — VITAMIN D 25 HYDROXY (VIT D DEFICIENCY, FRACTURES): VITD: 38.18 ng/mL (ref 30.00–100.00)

## 2018-03-19 NOTE — Progress Notes (Signed)
Subjective:   [name] is a 84 y.o. female who presents for Medicare Annual (Subsequent) preventive examination.  Review of Systems:  N/A Cardiac Risk Factors include: advanced age (>68men, >1 women);dyslipidemia;hypertension     Objective:     Vitals: BP 122/64 (BP Location: Left Arm, Patient Position: Sitting, Cuff Size: Normal)   Pulse 71   Temp 97.7 F (36.5 C) (Oral)   Ht 5' (1.524 m) Comment: shoes  Wt 123 lb 8 oz (56 kg)   SpO2 96%   BMI 24.12 kg/m   Body mass index is 24.12 kg/m.  Advanced Directives 03/19/2018 05/02/2017 03/14/2017 03/12/2016 02/07/2015 02/06/2015 10/26/2014  Does Patient Have a Medical Advance Directive? No;Yes Yes Yes No Yes No Yes  Type of Paramedic of [place];Living will Healthcare Power of [place];Living will - [place];Living will - [place];Living will  Does patient want to make changes to medical advance directive? No - Patient declined No - Patient declined - - No - Patient declined - No - Patient declined  Copy of Dasher in Chart? Yes - Yes - No - copy requested - -  Would patient like information on creating a medical advance directive? No - Patient declined - - No - patient declined information - No - patient declined information -    Tobacco Social History   Tobacco Use  Smoking Status Never Smoker  Smokeless Tobacco Never Used     Counseling given: No   Clinical Intake:  Pre-visit preparation completed: Yes  Pain : No/denies pain Pain Score: 0-No pain     Nutritional Status: BMI of 19-24  Normal Nutritional Risks: None Diabetes: No  How often do you need to have someone help you when you read instructions, pamphlets, or other written materials from your doctor or pharmacy?: 2 - Rarely What is the last grade level you completed in school?: 11th grade  Interpreter Needed?: No  Information entered by ::  LPinson, LPN  Past Medical History:  Diagnosis Date  . Allergy   . Arthritis   . CAD (coronary artery disease)   . Fatty liver    on u/s 2010  . Heart murmur   . Hyperlipidemia   . Hypertension   . Insomnia   . Myocardial infarction ([place])   . Osteoporosis    dxa 2013  . PAF (paroxysmal atrial fibrillation) ([place])   . SSS (sick sinus syndrome) ([place])    St. Jude Permanent Pacemaker 09/23/06  . Symptomatic bradycardia    Past Surgical History:  Procedure Laterality Date  . BACK SURGERY    . CARPAL TUNNEL RELEASE     left  . CHOLECYSTECTOMY    . CORONARY ANGIOPLASTY WITH STENT PLACEMENT  12/03/06   Stent to distal RCA  . CORONARY ANGIOPLASTY WITH STENT PLACEMENT  09/12/06   Stenting mid distal segment & PTCA alone distal RCA prox. to the posterior descending artery takeoff.  . CORONARY STENT PLACEMENT     RCA stent replaced   . EP IMPLANTABLE DEVICE N/A 10/26/2014   Procedure: PPM Generator Changeout;  Surgeon: Sanda Klein, MD;  Location: [facility];  Service: Cardiovascular;  Laterality: N/A;  . EP IMPLANTABLE DEVICE N/A 10/26/2014   Procedure: Pacemaker Revision- new RV Lead;  Surgeon: Sanda Klein, MD;  Location: [facility];  Service: Cardiovascular;  Laterality: N/A;  . FOOT SURGERY     Left  . HERNIA REPAIR  R inguinal  . PACEMAKER INSERTION  09/23/06   St.Jude  . US ECHOCARDIOGRAPHY  11/26/11   prox. septal thickening, mild concentric LVH, mod. ca+ AOV,trace AI,mild to mod valvular aortic stenosis.   Family History  Problem Relation Age of Onset  . Early death Mother        died at age 68, unknown cause  . Heart disease Father   . Diabetes Father    Social History   Socioeconomic History  . Marital status: Widowed    Spouse name: Not on file  . Number of children: 2  . Years of education: 10  . Highest education level: Not on file  Occupational History  . Occupation: N/A  Social Needs  . Financial resource strain: Not on file  . Food  insecurity:    Worry: Not on file    Inability: Not on file  . Transportation needs:    Medical: Not on file    Non-medical: Not on file  Tobacco Use  . Smoking status: Never Smoker  . Smokeless tobacco: Never Used  Substance and Sexual Activity  . Alcohol use: No  . Drug use: No  . Sexual activity: Never  Lifestyle  . Physical activity:    Days per week: Not on file    Minutes per session: Not on file  . Stress: Not on file  Relationships  . Social connections:    Talks on phone: Not on file    Gets together: Not on file    Attends religious service: Not on file    Active member of club or organization: Not on file    Attends meetings of clubs or organizations: Not on file    Relationship status: Not on file  Other Topics Concern  . Not on file  Social History Narrative   Widowed in 1992, lives alone   1 daughter alive, local.  Other daughter died suddenly 05/17/16 (patient found her daughter at time of death)   Retired from assembly work   Enjoys church, yardwork   Right-handed    Outpatient Encounter Medications as of 03/19/2018  Medication Sig  . Acetaminophen (TYLENOL 8 HOUR PO) Take by mouth as needed.  Marland Kitchen aspirin 81 MG tablet Take 81 mg by mouth daily.   Marland Kitchen augmented betamethasone dipropionate (DIPROLENE-AF) 0.05 % ointment Apply 1 application topically as needed.   . cholecalciferol (VITAMIN D) 1000 units tablet Take 1,000 Units by mouth daily.  . cyanocobalamin 1000 MCG tablet Take 1,000 mcg by mouth daily.  . fluticasone (FLONASE) 50 MCG/ACT nasal spray Place 2 sprays into both nostrils daily as needed for allergies or rhinitis.  Marland Kitchen levothyroxine (SYNTHROID, LEVOTHROID) 25 MCG tablet TAKE 1 TABLET BY MOUTH  DAILY BEFORE BREAKFAST  . loratadine (CLARITIN) 10 MG tablet Take 1 tablet (10 mg total) by mouth daily. (Patient taking differently: Take 10 mg by mouth daily as needed. )  . losartan (COZAAR) 50 MG tablet TAKE 1 TABLET BY MOUTH  DAILY  . Melatonin 3 MG TABS  Take 3-6 mg by mouth at bedtime as needed.  . naproxen sodium (RA NAPROXEN SODIUM) 220 MG tablet Take by mouth as needed.   . nitroGLYCERIN (NITROSTAT) 0.4 MG SL tablet Place 1 tablet (0.4 mg total) under the tongue every 5 (five) minutes as needed.  Marland Kitchen omeprazole (PRILOSEC) 40 MG capsule TAKE 1 CAPSULE BY MOUTH  DAILY AS NEEDED  . simvastatin (ZOCOR) 20 MG tablet TAKE 1 TABLET BY MOUTH AT  BEDTIME (Patient taking differently:  every morning. )  . traMADol (ULTRAM) 50 MG tablet Take 1 tablet (50 mg total) by mouth 3 (three) times daily as needed.  . [DISCONTINUED] donepezil (ARICEPT) 10 MG tablet Take 1 tablet (10 mg total) by mouth at bedtime.   No facility-administered encounter medications on file as of 03/19/2018.     Activities of Daily Living In your present state of health, do you have any difficulty performing the following activities: 03/19/2018  Hearing? Y  Vision? N  Difficulty concentrating or making decisions? Y  Walking or climbing stairs? Y  Dressing or bathing? N  Doing errands, shopping? Y  Preparing Food and eating ? N  Using the Toilet? N  In the past six months, have you accidently leaked urine? N  Do you have problems with loss of bowel control? N  Managing your Medications? Y  Managing your Finances? Y  Housekeeping or managing your Housekeeping? Y  Some recent data might be hidden    Patient Care Team: Tonia Ghent, MD as PCP - General (Family Medicine) Dingeldein, Remo Lipps, MD as Consulting Physician (Ophthalmology) Ronald Lobo, MD as Consulting Physician (Gastroenterology) Dasher, Rayvon Char, MD as Consulting Physician (Dermatology)    Assessment:   This is a routine wellness examination for Stormi.   Hearing Screening   125Hz  250Hz  500Hz  1000Hz  2000Hz  3000Hz  4000Hz  6000Hz  8000Hz   Right ear:   40 40 0  0    Left ear:   40 40 0  40    Vision Screening Comments: August 2019 @ [facility]   Exercise Activities and Dietary  recommendations Current Exercise Habits: The patient does not participate in regular exercise at present, Exercise limited by: None identified  Goals    . Increase physical activity     Starting 03/19/2018, I will attempt to walk at least 15 min daily, if weather permits.        Fall Risk Fall Risk  03/19/2018 03/14/2017 03/12/2016 02/04/2015 12/21/2013  Falls in the past year? No No Yes Yes Yes  Comment - - pt fell on front porch of home - patient states she was putting clothes and slipped and bumped head--patient states she had no known injuries  Number falls in past yr: - - 1 1 1   Injury with Fall? - - Yes Yes -  Follow up - - Falls evaluation completed;Falls prevention discussed - -   Depression Screen PHQ 2/9 Scores 03/19/2018 03/14/2017 03/12/2016 02/04/2015  PHQ - 2 Score 0 5 5 0  PHQ- 9 Score 0 12 12 -     Cognitive Function MMSE - Mini Mental State Exam 03/19/2018 03/14/2017 03/12/2016 01/04/2016  Not completed: - - (No Data) -  Orientation to time 5 5 - 4  Orientation to Place 5 5 - 5  Registration 3 3 - 3  Attention/ Calculation 0 0 - 5  Recall 0 0 - 2  Recall-comments unable to recall 3 of 3 words pt was unable to recall 3 of 3 words - -  Language- name 2 objects 0 0 - 2  Language- repeat 1 1 - 1  Language- follow 3 step command 3 2 - 3  Language- follow 3 step command-comments - pt was unable to follow 1 step of 3 step command - -  Language- read & follow direction 0 0 - 1  Write a sentence 0 0 - 1  Copy design 0 0 - 1  Total score 17 16 - 28     PLEASE NOTE:  A Mini-Cog screen was completed. Maximum score is 20. A value of 0 denotes this part of [place] MMSE was not completed or the patient failed this part of the Mini-Cog screening.   Mini-Cog Screening Orientation to Time - Max 5 pts Orientation to Place - Max 5 pts Registration - Max 3 pts Recall - Max 3 pts Language Repeat - Max 1 pts Language Follow 3 Step Command - Max 3 pts     Immunization History   Administered Date(s) Administered  . Influenza Split 05/13/2012  . Influenza,inj,Quad PF,6+ Mos 03/19/2016, 03/14/2017, 03/19/2018  . Pneumococcal Conjugate-13 03/19/2016  . Pneumococcal Polysaccharide-23 03/14/2017  . Tdap 03/05/2011    Screening Tests Health Maintenance  Topic Date Due  . MAMMOGRAM  03/19/2049 (Originally 07/04/2017)  . TETANUS/TDAP  03/04/2021  . INFLUENZA VACCINE  Completed  . DEXA SCAN  Completed  . PNA vac Low Risk Adult  Completed       Plan:     I have personally reviewed, addressed, and noted the following in the patient's chart:  A. Medical and social history B. Use of alcohol, tobacco or illicit drugs  C. Current medications and supplements D. Functional ability and status E.  Nutritional status F.  Physical activity G. Advance directives H. List of other physicians I.  Hospitalizations, surgeries, and ER visits in previous 12 months J.  [place] to include hearing, vision, cognitive, depression L. Referrals and appointments - none  In addition, I have reviewed and discussed with patient certain preventive protocols, quality metrics, and best practice recommendations. A written personalized care plan for preventive services as well as general preventive health recommendations were provided to patient.  See attached scanned questionnaire for additional information.   Signed,   Lindell Noe, MHA, BS, LPN Health Coach

## 2018-03-19 NOTE — Patient Instructions (Signed)
[name] , Thank you for taking time to come for your Medicare Wellness Visit. I appreciate your ongoing commitment to your health goals. Please review the following plan we discussed and let me know if I can assist you in the future.   These are the goals we discussed: Goals    . Increase physical activity     Starting 03/19/2018, I will attempt to walk at least 15 min daily, if weather permits.        This is a list of the screening recommended for you and due dates:  Health Maintenance  Topic Date Due  . Mammogram  03/19/2049*  . Tetanus Vaccine  03/04/2021  . Flu Shot  Completed  . DEXA scan (bone density measurement)  Completed  . Pneumonia vaccines  Completed  *Topic was postponed. The date shown is not the original due date.    [name] , Thank you for taking time to come for your Medicare Wellness Visit. I appreciate your ongoing commitment to your health goals. Please review the following plan we discussed and let me know if I can assist you in the future.   These are the goals we discussed: Goals    . Increase physical activity     Starting 03/19/2018, I will attempt to walk at least 15 min daily, if weather permits.        This is a list of the screening recommended for you and due dates:  Health Maintenance  Topic Date Due  . Mammogram  03/19/2049*  . Tetanus Vaccine  03/04/2021  . Flu Shot  Completed  . DEXA scan (bone density measurement)  Completed  . Pneumonia vaccines  Completed  *Topic was postponed. The date shown is not the original due date.   Preventive Care for Adults  A healthy lifestyle and preventive care can promote health and wellness. Preventive health guidelines for adults include the following key practices.  . A routine yearly physical is a good way to check with your health care provider about your health and preventive screening. It is a chance to share any concerns and updates on your health and to receive a thorough exam.  . Visit  your dentist for a routine exam and preventive care every 6 months. Brush your teeth twice a day and floss once a day. Good oral hygiene prevents tooth decay and gum disease.  . The frequency of eye exams is based on your age, health, family medical history, use  of contact lenses, and other factors. Follow your health care provider's recommendations for frequency of eye exams.  . Eat a healthy diet. Foods like vegetables, fruits, whole grains, low-fat dairy products, and lean protein foods contain the nutrients you need without too many calories. Decrease your intake of foods high in solid fats, added sugars, and salt. Eat the right amount of calories for you. Get information about a proper diet from your health care provider, if necessary.  . Regular physical exercise is one of the most important things you can do for your health. Most adults should get at least 150 minutes of moderate-intensity exercise (any activity that increases your heart rate and causes you to sweat) each week. In addition, most adults need muscle-strengthening exercises on 2 or more days a week.  Silver Sneakers may be a benefit available to you. To determine eligibility, you may visit the website: www.silversneakers.com or contact program at [phone] Mon-Fri between 8AM-8PM.   . Maintain a healthy weight. The body mass  index (BMI) is a screening tool to identify possible weight problems. It provides an estimate of body fat based on height and weight. Your health care provider can find your BMI and can help you achieve or maintain a healthy weight.   For adults 20 years and older: ? A BMI below 18.5 is considered underweight. ? A BMI of 18.5 to 24.9 is normal. ? A BMI of 25 to 29.9 is considered overweight. ? A BMI of 30 and above is considered obese.   . Maintain normal blood lipids and cholesterol levels by exercising and minimizing your intake of saturated fat. Eat a balanced diet with plenty of fruit and  vegetables. Blood tests for lipids and cholesterol should begin at age 59 and be repeated every 5 years. If your lipid or cholesterol levels are high, you are over 50, or you are at high risk for heart disease, you may need your cholesterol levels checked more frequently. Ongoing high lipid and cholesterol levels should be treated with medicines if diet and exercise are not working.  . If you smoke, find out from your health care provider how to quit. If you do not use tobacco, please do not start.  . If you choose to drink alcohol, please do not consume more than 2 drinks per day. One drink is considered to be 12 ounces (355 mL) of beer, 5 ounces (148 mL) of wine, or 1.5 ounces (44 mL) of liquor.  . If you are 23-31 years old, ask your health care provider if you should take aspirin to prevent strokes.  . Use sunscreen. Apply sunscreen liberally and repeatedly throughout the day. You should seek shade when your shadow is shorter than you. Protect yourself by wearing long sleeves, pants, a wide-brimmed hat, and sunglasses year round, whenever you are outdoors.  . Once a month, do a whole body skin exam, using a mirror to look at the skin on your back. Tell your health care provider of new moles, moles that have irregular borders, moles that are larger than a pencil eraser, or moles that have changed in shape or color.

## 2018-03-19 NOTE — Progress Notes (Signed)
PCP notes:   Health maintenance:  Flu vaccine - administered  Abnormal screenings:   Hearing - failed  Hearing Screening   125Hz  250Hz  500Hz  1000Hz  2000Hz  3000Hz  4000Hz  6000Hz  8000Hz   Right ear:   40 40 0  0    Left ear:   40 40 0  40    Vision Screening Comments: August 2019 @ [facility]  Mini-Cog score: 17/20 MMSE - Mini Mental State Exam 03/19/2018 03/14/2017 03/12/2016 01/04/2016  Not completed: - - (No Data) -  Orientation to time 5 5 - 4  Orientation to Place 5 5 - 5  Registration 3 3 - 3  Attention/ Calculation 0 0 - 5  Recall 0 0 - 2  Recall-comments unable to recall 3 of 3 words pt was unable to recall 3 of 3 words - -  Language- name 2 objects 0 0 - 2  Language- repeat 1 1 - 1  Language- follow 3 step command 3 2 - 3  Language- follow 3 step command-comments - pt was unable to follow 1 step of 3 step command - -  Language- read & follow direction 0 0 - 1  Write a sentence 0 0 - 1  Copy design 0 0 - 1  Total score 17 16 - 28    Patient concerns:   Daughter wants to discuss Ultram and if this is best medication for patient.  Daughter wants to discuss measles immunization for patient.   Nurse concerns:  None  Next PCP appt:   04/10/18 @ 0945

## 2018-03-20 NOTE — Progress Notes (Signed)
   Subjective:    Patient ID: [name], female    DOB: 25-Apr-1928, 84 y.o.   MRN: [id number]  HPI  I reviewed health advisor's note, was available for consultation, and agree with documentation and plan.   Review of Systems     Objective:   Physical Exam         Assessment & Plan:

## 2018-03-21 ENCOUNTER — Telehealth: Payer: Self-pay | Admitting: Family Medicine

## 2018-03-21 NOTE — Telephone Encounter (Signed)
Left message on voicemail for pt's daughter to return call to office for results.

## 2018-03-21 NOTE — Telephone Encounter (Signed)
Copied from [place] [id number]. Topic: Quick Communication - See Telephone Encounter >> Mar 21, 2018  4:25 PM Sheran Luz wrote: CRM for notification. See Telephone encounter for: 03/21/18. Pt daughter calling back to check status of pts lab results, states that she received a voicemail but the message did not go through. Pt daughter is requesting a call back to get lab results. Please advise.

## 2018-03-24 ENCOUNTER — Encounter: Payer: Medicare Other | Admitting: Family Medicine

## 2018-03-25 LAB — CUP PACEART REMOTE DEVICE CHECK
Battery Remaining Longevity: 123 mo
Brady Statistic AP VP Percent: 1 %
Brady Statistic AP VS Percent: 55 %
Brady Statistic AS VP Percent: 1 %
Implantable Lead Implant Date: 20080331
Implantable Lead Location: 753860
Lead Channel Impedance Value: 360 Ohm
Lead Channel Impedance Value: 450 Ohm
Lead Channel Pacing Threshold Amplitude: 1.25 V
Lead Channel Sensing Intrinsic Amplitude: 12 mV
Lead Channel Sensing Intrinsic Amplitude: 2.2 mV
Lead Channel Setting Pacing Amplitude: 1.75 V
Lead Channel Setting Pacing Pulse Width: 0.4 ms
MDC IDC LEAD IMPLANT DT: 20160503
MDC IDC LEAD LOCATION: 753859
MDC IDC MSMT BATTERY REMAINING PERCENTAGE: 95.5 %
MDC IDC MSMT BATTERY VOLTAGE: 3.01 V
MDC IDC MSMT LEADCHNL RA PACING THRESHOLD AMPLITUDE: 0.75 V
MDC IDC MSMT LEADCHNL RA PACING THRESHOLD PULSEWIDTH: 0.4 ms
MDC IDC MSMT LEADCHNL RV PACING THRESHOLD PULSEWIDTH: 0.4 ms
MDC IDC PG IMPLANT DT: 20160503
MDC IDC SESS DTM: 20190910060015
MDC IDC SET LEADCHNL RV PACING AMPLITUDE: 2.5 V
MDC IDC SET LEADCHNL RV SENSING SENSITIVITY: 2 mV
MDC IDC STAT BRADY AS VS PERCENT: 45 %
MDC IDC STAT BRADY RA PERCENT PACED: 55 %
MDC IDC STAT BRADY RV PERCENT PACED: 1 %
Pulse Gen Serial Number: 7766577

## 2018-04-10 ENCOUNTER — Ambulatory Visit (INDEPENDENT_AMBULATORY_CARE_PROVIDER_SITE_OTHER): Payer: Medicare Other | Admitting: Family Medicine

## 2018-04-10 ENCOUNTER — Encounter: Payer: Self-pay | Admitting: Family Medicine

## 2018-04-10 VITALS — BP 128/60 | HR 66 | Temp 97.5°F | Ht 60.0 in | Wt 125.5 lb

## 2018-04-10 DIAGNOSIS — R413 Other amnesia: Secondary | ICD-10-CM | POA: Diagnosis not present

## 2018-04-10 DIAGNOSIS — E785 Hyperlipidemia, unspecified: Secondary | ICD-10-CM | POA: Diagnosis not present

## 2018-04-10 DIAGNOSIS — H9190 Unspecified hearing loss, unspecified ear: Secondary | ICD-10-CM

## 2018-04-10 DIAGNOSIS — K760 Fatty (change of) liver, not elsewhere classified: Secondary | ICD-10-CM | POA: Diagnosis not present

## 2018-04-10 DIAGNOSIS — E039 Hypothyroidism, unspecified: Secondary | ICD-10-CM

## 2018-04-10 DIAGNOSIS — K644 Residual hemorrhoidal skin tags: Secondary | ICD-10-CM

## 2018-04-10 DIAGNOSIS — K5900 Constipation, unspecified: Secondary | ICD-10-CM

## 2018-04-10 DIAGNOSIS — I10 Essential (primary) hypertension: Secondary | ICD-10-CM

## 2018-04-10 DIAGNOSIS — R21 Rash and other nonspecific skin eruption: Secondary | ICD-10-CM

## 2018-04-10 DIAGNOSIS — M549 Dorsalgia, unspecified: Secondary | ICD-10-CM | POA: Diagnosis not present

## 2018-04-10 DIAGNOSIS — Z7189 Other specified counseling: Secondary | ICD-10-CM

## 2018-04-10 MED ORDER — TRAMADOL HCL 50 MG PO TABS: 50.0000 mg | ORAL_TABLET | Freq: Three times a day (TID) | ORAL | 1 refills | Status: DC | PRN

## 2018-04-10 MED ORDER — LEVOTHYROXINE SODIUM 25 MCG PO TABS
ORAL_TABLET | ORAL | 3 refills | Status: DC
Start: 2018-04-10 — End: 2019-02-16

## 2018-04-10 MED ORDER — SIMVASTATIN 20 MG PO TABS: 20.0000 mg | ORAL_TABLET | ORAL | Status: DC

## 2018-04-10 MED ORDER — LORATADINE 10 MG PO TABS: 10.0000 mg | ORAL_TABLET | Freq: Every day | ORAL | Status: DC | PRN

## 2018-04-10 NOTE — Progress Notes (Signed)
Hearing screening d/w pt.  Declined hearing aids.  She had seen ENT.    Memory d/w pt.  Some days she has more troubles than others.  Changes in schedule are harder for patient.  She'll occ confuse the day of an appointment.  Family helps with medicine.  No clear/sudden changes.  She isn't cooking much but fixes her plates.  She showers independently.  She is using a walker at baseline.  She couldn't tell a difference coming off aricept.    Family has tried to simplify med regimen.  She is still taking her thyroid medicine early on an empty stomach.  She is usually taking other meds in the AM  Discussed MMR status.  Given her age, she shouldn't need extra vaccination.    Discussed tylenol vs aleve vs tramadol.  For back pain.  Reasonable to start with Tylenol.  If she still has pain she can use Aleve with GI cautions.  If still in pain after that she can use tramadol with sedation caution.  Mild ALT elevation noted. H/o fatty liver.    TSH wnl. Compliant with med. No ADE on med.    Hypertension:    Using medication without problems or lightheadedness: yes Chest pain with exertion:no Edema:no Short of breath:no Labs d/w pt.   Likely reasonable to continue aspirin as is.  No ADE on med.    Some constipation.  Using OTC meds.  Had to use an enema recently.  D/w pt about miralax use.    Elevated Cholesterol: Using medications without problems: no Muscle aches: no Diet compliance: encouraged.   Exercise: limited, walking with walker.    She has some irritation on her perineum.  She has had some irritation in her armpits bilaterally.  See plan.  Mammogram declined. D/w pt.   Discussed fall cautions, fall button.   Family and patient think she can still live in her current situation as is.    PMH and SH reviewed  ROS: Per HPI unless specifically indicated in ROS section   Meds, vitals, and allergies reviewed.   GEN: nad, alert, pleasant in conversation. HEENT: mucous membranes  moist NECK: supple w/o LA CV: rrr PULM: ctab, no inc wob ABD: soft, +bs EXT: no edema SKIN: minimal axillary irritation.  Ext hemorrhoids with some irritation but not yeast.

## 2018-04-10 NOTE — Patient Instructions (Addendum)
For pain, start with tylenol as needed.  Then use aleve with food if needed.  Use tramadol last.    Use miralax daily as needed.  Try to increase high fiber foods.    Try plain hydrocortisone as needed in the armpits.   Use Prep H has needed.    Update me as needed.  Take care.  Glad to see you.

## 2018-04-13 DIAGNOSIS — K644 Residual hemorrhoidal skin tags: Secondary | ICD-10-CM | POA: Insufficient documentation

## 2018-04-13 DIAGNOSIS — H9190 Unspecified hearing loss, unspecified ear: Secondary | ICD-10-CM | POA: Insufficient documentation

## 2018-04-13 NOTE — Assessment & Plan Note (Signed)
Appears to have benign irritation in the axilla.  Can use topical hydrocortisone as needed.

## 2018-04-13 NOTE — Assessment & Plan Note (Signed)
Reasonable to continue as is.  No change in meds.  Labs discussed with patient.

## 2018-04-13 NOTE — Assessment & Plan Note (Signed)
TSH normal.  Continue as is.  No change in meds.  Compliant.

## 2018-04-13 NOTE — Assessment & Plan Note (Signed)
She can use Preparation H as needed.  Update me as needed.

## 2018-04-13 NOTE — Assessment & Plan Note (Signed)
Declined hearing aids.  Has seen ENT previously.

## 2018-04-13 NOTE — Assessment & Plan Note (Signed)
She could not tell a difference on or off Aricept.  Would continue as is for now.  Family is helping her maintain her current situation at home.  Encouraged using a walker.  Encouraged having a fall button.  Routine cautions discussed.  Family has help simplify her medication regimen.

## 2018-04-13 NOTE — Assessment & Plan Note (Signed)
Fatty liver noted.  Would continue to try to treat this with healthy diet and limit high sugar high-fat foods.  No change in meds at this point.  No abdominal symptoms.  No jaundice.  I would be hesitant to put her through more extensive work-up given her age and other conditions.

## 2018-04-13 NOTE — Assessment & Plan Note (Signed)
Reasonable to increase fiber intake and use MiraLAX as needed.

## 2018-04-13 NOTE — Assessment & Plan Note (Addendum)
Reasonable to start with Tylenol.  If she still has pain she can use Aleve with GI cautions.  If still in pain after that she can use tramadol with sedation caution.  Update me as needed.   >25 minutes spent in face to face time with patient, >50% spent in counselling or coordination of care.

## 2018-04-13 NOTE — Assessment & Plan Note (Signed)
Continue statin.  Labs discussed with patient.

## 2018-04-15 ENCOUNTER — Telehealth: Payer: Self-pay | Admitting: *Deleted

## 2018-04-15 NOTE — Telephone Encounter (Addendum)
PA submitted thru [facility] for Tramadol, awaiting response.  Approved thru [facility], awaiting faxed response to scan.

## 2018-04-22 DIAGNOSIS — D2272 Melanocytic nevi of left lower limb, including hip: Secondary | ICD-10-CM | POA: Diagnosis not present

## 2018-04-22 DIAGNOSIS — X32XXXA Exposure to sunlight, initial encounter: Secondary | ICD-10-CM | POA: Diagnosis not present

## 2018-04-22 DIAGNOSIS — D2262 Melanocytic nevi of left upper limb, including shoulder: Secondary | ICD-10-CM | POA: Diagnosis not present

## 2018-04-22 DIAGNOSIS — Z85828 Personal history of other malignant neoplasm of skin: Secondary | ICD-10-CM | POA: Diagnosis not present

## 2018-04-22 DIAGNOSIS — L570 Actinic keratosis: Secondary | ICD-10-CM | POA: Diagnosis not present

## 2018-04-22 DIAGNOSIS — Z08 Encounter for follow-up examination after completed treatment for malignant neoplasm: Secondary | ICD-10-CM | POA: Diagnosis not present

## 2018-04-22 DIAGNOSIS — D225 Melanocytic nevi of trunk: Secondary | ICD-10-CM | POA: Diagnosis not present

## 2018-05-21 ENCOUNTER — Telehealth: Payer: Self-pay | Admitting: Family Medicine

## 2018-05-21 NOTE — Telephone Encounter (Signed)
Wanting to know the status of the forms for the right hip brace that was faxed to [place] on 11.24.19. Please advise

## 2018-05-26 NOTE — Telephone Encounter (Signed)
Faxed our letter informing them that the request must come from the patient.

## 2018-05-27 ENCOUNTER — Encounter: Payer: Self-pay | Admitting: Family Medicine

## 2018-05-27 ENCOUNTER — Ambulatory Visit (INDEPENDENT_AMBULATORY_CARE_PROVIDER_SITE_OTHER): Payer: Medicare Other | Admitting: Family Medicine

## 2018-05-27 DIAGNOSIS — M542 Cervicalgia: Secondary | ICD-10-CM

## 2018-05-27 NOTE — Patient Instructions (Signed)
Likely an irritated muscle that attaches behind the ear.   Try heat and raising the head of your bed a little.   You can try ice if needed.    Don't fall asleep on the heating pad.   Update me as needed.  Take care.  Glad to see you.

## 2018-05-27 NOTE — Progress Notes (Signed)
Headaches.  Noted in the last week or so.  R occiput, posterior to R ear.  Less notes on the L side.  Tramadol and tylenol didn't help.  Local cream didn't help much.  She hasn't tried heat yet.  No sx on the top of the scalp.  She has a flat pillow, that is her preference.  She has an adjustable bed.    A form had come into the clinic asking about a hip brace for the patient.  We did not give approval for this.  I checked with the patient and her family today.  They did not request this.  This could have been some type of attempted scam by a third party.  We did not originate the order.  We did not sign off on the order.  After discussion today I shredded the form.  Patient's family is aware of the situation.  Meds, vitals, and allergies reviewed.   ROS: Per HPI unless specifically indicated in ROS section   nad ncat Neck supple, not tender to palpation except for the superior portion of the right SCM near the right mastoid.  No overlying skin changes.  Midline neck is not tender. Tympanic membranes within normal limits bilaterally. No TMJ pain on range of motion OP within normal limits No lymphadenopathy in the neck

## 2018-05-28 NOTE — Assessment & Plan Note (Signed)
It looks like she is having muscle pain along the posterior portion of the right SCM.  Discussed trying heat versus ice and repositioning in the bed at night.  Can use Tylenol as needed.  Update me as needed.  No alarming findings noted otherwise.  Okay for outpatient follow-up.

## 2018-06-03 ENCOUNTER — Ambulatory Visit (INDEPENDENT_AMBULATORY_CARE_PROVIDER_SITE_OTHER): Payer: Medicare Other

## 2018-06-03 DIAGNOSIS — R001 Bradycardia, unspecified: Secondary | ICD-10-CM | POA: Diagnosis not present

## 2018-06-04 NOTE — Progress Notes (Signed)
Remote pacemaker transmission.   

## 2018-06-09 ENCOUNTER — Other Ambulatory Visit: Payer: Self-pay | Admitting: Family Medicine

## 2018-07-10 ENCOUNTER — Encounter: Payer: Self-pay | Admitting: Family Medicine

## 2018-07-10 ENCOUNTER — Ambulatory Visit (INDEPENDENT_AMBULATORY_CARE_PROVIDER_SITE_OTHER): Payer: Medicare Other | Admitting: Family Medicine

## 2018-07-10 DIAGNOSIS — R05 Cough: Secondary | ICD-10-CM | POA: Diagnosis not present

## 2018-07-10 DIAGNOSIS — R059 Cough, unspecified: Secondary | ICD-10-CM | POA: Insufficient documentation

## 2018-07-10 MED ORDER — ALBUTEROL SULFATE HFA 108 (90 BASE) MCG/ACT IN AERS: 1.0000 | INHALATION_SPRAY | Freq: Four times a day (QID) | RESPIRATORY_TRACT | 0 refills | Status: DC | PRN

## 2018-07-10 NOTE — Assessment & Plan Note (Signed)
Likely a self resolving nonflu virus.  Use albuterol with a spacer as needed.  Update me as needed.  Rest and fluids.  Okay for outpatient f/u.

## 2018-07-10 NOTE — Progress Notes (Signed)
Cough.  Hoarse voice.   Some sputum with throat clearing.  Family/others noted it starting yesterday.  Felt fine the day before that.    Some rhinorrhea. Started flonase in the meantime.  Used benadryl yesterday.  No fevers known.  No facial pain.  Some occ mild ear pain. Can still take a deep breath.  Some upper airway noise noted with exhaling.    Walking with walker at baseline.    Meds, vitals, and allergies reviewed.   ROS: Per HPI unless specifically indicated in ROS section   GEN: nad, alert and oriented HEENT: mucous membranes moist, tm w/o erythema, nasal exam w/o erythema, clear discharge noted,  OP with cobblestoning NECK: supple w/o LA, + UAN noted.  CV: rrr.  Soft SEM noted.   PULM: ctab except for scant B exp wheeze w/o focal dec in BS, no inc wob EXT: no edema SKIN: no acute rash Hoarse voice.  No stridor.

## 2018-07-10 NOTE — Patient Instructions (Signed)
Likely a self resolving virus.  Use albuterol with a spacer as needed.  Update me as needed.  Rest and fluids.  Take care.  Glad to see you.

## 2018-07-19 LAB — CUP PACEART REMOTE DEVICE CHECK
Battery Remaining Longevity: 124 mo
Battery Remaining Percentage: 95.5 %
Battery Voltage: 3.01 V
Brady Statistic AS VP Percent: 1 %
Brady Statistic RA Percent Paced: 46 %
Date Time Interrogation Session: 20191210080653
Implantable Lead Implant Date: 20080331
Implantable Lead Location: 753860
Implantable Pulse Generator Implant Date: 20160503
Lead Channel Impedance Value: 390 Ohm
Lead Channel Pacing Threshold Amplitude: 1.25 V
Lead Channel Pacing Threshold Pulse Width: 0.4 ms
Lead Channel Sensing Intrinsic Amplitude: 2.1 mV
Lead Channel Setting Pacing Amplitude: 2.5 V
Lead Channel Setting Pacing Pulse Width: 0.4 ms
MDC IDC LEAD IMPLANT DT: 20160503
MDC IDC LEAD LOCATION: 753859
MDC IDC MSMT LEADCHNL RA PACING THRESHOLD AMPLITUDE: 0.75 V
MDC IDC MSMT LEADCHNL RA PACING THRESHOLD PULSEWIDTH: 0.4 ms
MDC IDC MSMT LEADCHNL RV IMPEDANCE VALUE: 450 Ohm
MDC IDC MSMT LEADCHNL RV SENSING INTR AMPL: 12 mV
MDC IDC SET LEADCHNL RA PACING AMPLITUDE: 1.75 V
MDC IDC SET LEADCHNL RV SENSING SENSITIVITY: 2 mV
MDC IDC STAT BRADY AP VP PERCENT: 1 %
MDC IDC STAT BRADY AP VS PERCENT: 46 %
MDC IDC STAT BRADY AS VS PERCENT: 53 %
MDC IDC STAT BRADY RV PERCENT PACED: 1 %
Pulse Gen Serial Number: 7766577

## 2018-07-28 ENCOUNTER — Telehealth: Payer: Self-pay

## 2018-07-28 NOTE — Telephone Encounter (Signed)
Already did that.

## 2018-07-28 NOTE — Telephone Encounter (Signed)
Harrell Gave with a supply pharmacy called asking for our fax number to send faxes requesting pain relieving supplies for the pt. I gave him the number but advised hm if the pt had not requested these items, we would not be filling out paperwork. He said to just write that on the request when it comes if the pt had not requested it. He said she had (of course).  Sending to [employer] as an [place].

## 2018-07-30 ENCOUNTER — Telehealth: Payer: Self-pay

## 2018-07-30 NOTE — Telephone Encounter (Signed)
Left message for patient to call back. Received a form from [facility] office for orthosis for patient's hip. Not sure if patient requested this. Waiting to hear back from patient to make sure. Placing form on Lugene's desk until hearing back.

## 2018-07-31 NOTE — Telephone Encounter (Signed)
Spoke with daughter who says they do not want nor need this equipment.  Faxed denial x 3.

## 2018-09-02 ENCOUNTER — Ambulatory Visit (INDEPENDENT_AMBULATORY_CARE_PROVIDER_SITE_OTHER): Payer: Medicare Other | Admitting: *Deleted

## 2018-09-02 DIAGNOSIS — I495 Sick sinus syndrome: Secondary | ICD-10-CM

## 2018-09-03 LAB — CUP PACEART REMOTE DEVICE CHECK
Battery Remaining Longevity: 122 mo
Battery Remaining Percentage: 95.5 %
Brady Statistic AP VS Percent: 44 %
Brady Statistic AS VS Percent: 56 %
Brady Statistic RA Percent Paced: 43 %
Brady Statistic RV Percent Paced: 1 %
Date Time Interrogation Session: 20200310060013
Implantable Lead Implant Date: 20080331
Implantable Lead Location: 753859
Implantable Lead Location: 753860
Implantable Pulse Generator Implant Date: 20160503
Lead Channel Impedance Value: 340 Ohm
Lead Channel Pacing Threshold Amplitude: 0.75 V
Lead Channel Pacing Threshold Amplitude: 1.25 V
Lead Channel Pacing Threshold Pulse Width: 0.4 ms
Lead Channel Pacing Threshold Pulse Width: 0.4 ms
Lead Channel Sensing Intrinsic Amplitude: 1.1 mV
Lead Channel Setting Pacing Amplitude: 2.5 V
Lead Channel Setting Pacing Pulse Width: 0.4 ms
MDC IDC LEAD IMPLANT DT: 20160503
MDC IDC MSMT BATTERY VOLTAGE: 3.01 V
MDC IDC MSMT LEADCHNL RV IMPEDANCE VALUE: 440 Ohm
MDC IDC MSMT LEADCHNL RV SENSING INTR AMPL: 12 mV
MDC IDC PG SERIAL: 7766577
MDC IDC SET LEADCHNL RA PACING AMPLITUDE: 1.75 V
MDC IDC SET LEADCHNL RV SENSING SENSITIVITY: 2 mV
MDC IDC STAT BRADY AP VP PERCENT: 1 %
MDC IDC STAT BRADY AS VP PERCENT: 1 %

## 2018-09-09 ENCOUNTER — Encounter: Payer: Self-pay | Admitting: Cardiology

## 2018-09-09 NOTE — Progress Notes (Signed)
Remote pacemaker transmission.   

## 2018-09-18 ENCOUNTER — Other Ambulatory Visit: Payer: Self-pay | Admitting: Family Medicine

## 2018-12-02 ENCOUNTER — Ambulatory Visit (INDEPENDENT_AMBULATORY_CARE_PROVIDER_SITE_OTHER): Payer: Medicare Other | Admitting: *Deleted

## 2018-12-02 DIAGNOSIS — I495 Sick sinus syndrome: Secondary | ICD-10-CM | POA: Diagnosis not present

## 2018-12-02 DIAGNOSIS — R001 Bradycardia, unspecified: Secondary | ICD-10-CM | POA: Diagnosis not present

## 2018-12-02 LAB — CUP PACEART REMOTE DEVICE CHECK
Battery Remaining Longevity: 122 mo
Battery Remaining Percentage: 95.5 %
Battery Voltage: 3.01 V
Brady Statistic AP VP Percent: 1 %
Brady Statistic AP VS Percent: 49 %
Brady Statistic AS VP Percent: 1 %
Brady Statistic AS VS Percent: 51 %
Brady Statistic RA Percent Paced: 48 %
Brady Statistic RV Percent Paced: 1 %
Date Time Interrogation Session: 20200609073204
Implantable Lead Implant Date: 20080331
Implantable Lead Implant Date: 20160503
Implantable Lead Location: 753859
Implantable Lead Location: 753860
Implantable Pulse Generator Implant Date: 20160503
Lead Channel Impedance Value: 340 Ohm
Lead Channel Impedance Value: 430 Ohm
Lead Channel Pacing Threshold Amplitude: 0.75 V
Lead Channel Pacing Threshold Amplitude: 1.25 V
Lead Channel Pacing Threshold Pulse Width: 0.4 ms
Lead Channel Pacing Threshold Pulse Width: 0.4 ms
Lead Channel Sensing Intrinsic Amplitude: 1.1 mV
Lead Channel Sensing Intrinsic Amplitude: 12 mV
Lead Channel Setting Pacing Amplitude: 1.75 V
Lead Channel Setting Pacing Amplitude: 2.5 V
Lead Channel Setting Pacing Pulse Width: 0.4 ms
Lead Channel Setting Sensing Sensitivity: 2 mV
Pulse Gen Model: 2240
Pulse Gen Serial Number: 7766577

## 2018-12-10 NOTE — Progress Notes (Signed)
Remote pacemaker transmission.   

## 2018-12-31 ENCOUNTER — Other Ambulatory Visit: Payer: Self-pay | Admitting: Family Medicine

## 2019-01-13 ENCOUNTER — Encounter: Payer: Medicare Other | Admitting: Internal Medicine

## 2019-01-20 ENCOUNTER — Encounter: Payer: Medicare Other | Admitting: Internal Medicine

## 2019-02-06 ENCOUNTER — Telehealth: Payer: Self-pay

## 2019-02-06 NOTE — Telephone Encounter (Signed)
Spoke with patient's daughter, [name], ok per DPR on file. Received form from [facility] laboratory for cardiac genetic testing and daughter states that they did not request this but she is sure that patient probably authorized this over the phone when scammers call. Per daughter patient keeps agreeing to things when they call. Sending form back with denial for this.

## 2019-02-11 ENCOUNTER — Encounter: Payer: Self-pay | Admitting: Family Medicine

## 2019-02-11 ENCOUNTER — Ambulatory Visit (INDEPENDENT_AMBULATORY_CARE_PROVIDER_SITE_OTHER): Payer: Medicare Other | Admitting: Family Medicine

## 2019-02-11 ENCOUNTER — Telehealth: Payer: Self-pay

## 2019-02-11 ENCOUNTER — Other Ambulatory Visit: Payer: Self-pay

## 2019-02-11 VITALS — BP 144/68 | HR 71 | Temp 98.7°F | Ht 60.0 in | Wt 130.1 lb

## 2019-02-11 DIAGNOSIS — S81811A Laceration without foreign body, right lower leg, initial encounter: Secondary | ICD-10-CM

## 2019-02-11 DIAGNOSIS — Z23 Encounter for immunization: Secondary | ICD-10-CM

## 2019-02-11 DIAGNOSIS — L089 Local infection of the skin and subcutaneous tissue, unspecified: Secondary | ICD-10-CM | POA: Insufficient documentation

## 2019-02-11 NOTE — Telephone Encounter (Signed)
[name] (DPR signed) said pt slipped on cement steps at kitchen door on 02/09/19; scraped area 4"x4" approx on lower rt leg. [name] is concerned of infection possibility due to large area. [name] does not want to do virtual visit because her phone will not hold signal. [name] scheduled in office visit with Dr Glori Bickers today at 3 PM.Pt and [name] has no covid symptoms, no travel and no known exposure to + covid. [name] proceded to tell me area has no drainage and she has been cleaning with hydrogen peroxide; I advised not to use hydrogen peroxide and she will get further instruction on care of wound at appt. [name] voiced understanding.

## 2019-02-11 NOTE — Telephone Encounter (Signed)
I will see her then  

## 2019-02-11 NOTE — Patient Instructions (Signed)
Keep wound clean with soap and water  Dab it dry gently  Dress with gauze and antibiotic ointment  (none stick is great)  Keep doing this while wound is wet  Watch for redness or swelling (signs of infection)  Keep [place] posted   Flu shot today

## 2019-02-11 NOTE — Progress Notes (Signed)
Subjective:    Patient ID: [name], female    DOB: December 20, 1927, 85 y.o.   MRN: [id number]  HPI  Here with a scraped R leg   [place] in yard - Monday afternoon  Did not fall on anything-just the ground  Was on a walk way   Scraped her leg  No other injury  [address] forward- may have hit head but it did not hurt   She dressed it with a sock  Some blood/not soaked  Wiped with wet rag (did not use soap)   Daughter cleaned it with water and peroxide yesterday   Today she used a pine oil solution   Had Tdap 9/12    Patient Active Problem List   Diagnosis Date Noted  . Skin tear of lower leg without complication 88/50/2774  . Cough 07/10/2018  . Hearing loss 04/13/2018  . External hemorrhoids 04/13/2018  . Displaced fracture of lateral condyle of right humerus with routine healing 07/03/2017  . Gait abnormality 04/10/2017  . Hives 04/10/2017  . Constipation 04/10/2017  . Healthcare maintenance 03/19/2016  . Memory loss 12/11/2015  . SSS (sick sinus syndrome) ([place]) 05/17/2015  . Hypothyroidism 11/02/2014  . PAF- none documented in years, not on anticoagulation 10/27/2014  . Diaphragmatic stimulation by pacemaker 10/26/2014  . Fall at home 07/14/2014  . Bruit of right carotid artery 01/26/2014  . Medicare annual wellness visit, subsequent 12/22/2013  . Advance care planning 12/22/2013  . Hyperlipidemia 07/29/2013  . Fatty liver 06/04/2012  . Loose stools 02/01/2012  . Murmur, cardiac 11/28/2011  . Back pain 08/29/2011  . Osteoporosis 06/26/2011  . Vertigo 03/05/2011  . Neck pain 03/05/2011  . Rash 11/16/2010  . Essential hypertension, benign 11/16/2010  . CAD S/P PCI 2008 11/16/2010  . Pacemaker - dual chamber St. Jude gen change 2016 11/16/2010  . Tinnitus of both ears 11/16/2010  . Insomnia 11/16/2010   Past Medical History:  Diagnosis Date  . Allergy   . Arthritis   . CAD (coronary artery disease)   . Fatty liver    on u/s 2010  . Heart murmur   .  Hyperlipidemia   . Hypertension   . Insomnia   . Myocardial infarction ([place])   . Osteoporosis    dxa 2013  . PAF (paroxysmal atrial fibrillation) ([place])   . SSS (sick sinus syndrome) ([place])    St. Jude Permanent Pacemaker 09/23/06  . Symptomatic bradycardia    Past Surgical History:  Procedure Laterality Date  . BACK SURGERY    . CARPAL TUNNEL RELEASE     left  . CHOLECYSTECTOMY    . CORONARY ANGIOPLASTY WITH STENT PLACEMENT  12/03/06   Stent to distal RCA  . CORONARY ANGIOPLASTY WITH STENT PLACEMENT  09/12/06   Stenting mid distal segment & PTCA alone distal RCA prox. to the posterior descending artery takeoff.  . CORONARY STENT PLACEMENT     RCA stent replaced   . EP IMPLANTABLE DEVICE N/A 10/26/2014   Procedure: PPM Generator Changeout;  Surgeon: Sanda Klein, MD;  Location: [place] [facility];  Service: Cardiovascular;  Laterality: N/A;  . EP IMPLANTABLE DEVICE N/A 10/26/2014   Procedure: Pacemaker Revision- new RV Lead;  Surgeon: Sanda Klein, MD;  Location: [place] [facility];  Service: Cardiovascular;  Laterality: N/A;  . FOOT SURGERY     Left  . HERNIA REPAIR     R inguinal  . PACEMAKER INSERTION  09/23/06   St.Jude  . US ECHOCARDIOGRAPHY  11/26/11   prox. septal thickening, mild concentric LVH, mod. ca+ AOV,trace AI,mild to mod valvular aortic stenosis.   Social History   Tobacco Use  . Smoking status: Never Smoker  . Smokeless tobacco: Never Used  Substance Use Topics  . Alcohol use: No  . Drug use: No   Family History  Problem Relation Age of Onset  . Early death Mother        died at age 62, unknown cause  . Heart disease Father   . Diabetes Father    Allergies  Allergen Reactions  . Fosamax [Alendronate Sodium] Other (See Comments)    Anxious and "I just didn't feel right" after each dose; 2.5 month trial   Current Outpatient Medications on File Prior to Visit  Medication Sig Dispense Refill  . Acetaminophen (TYLENOL 8 HOUR PO) Take by mouth as needed.     Marland Kitchen aspirin 81 MG tablet Take 81 mg by mouth daily.     Marland Kitchen augmented betamethasone dipropionate (DIPROLENE-AF) 0.05 % ointment Apply 1 application topically as needed.     . cholecalciferol (VITAMIN D) 1000 units tablet Take 1,000 Units by mouth daily.    . cyanocobalamin 1000 MCG tablet Take 1,000 mcg by mouth daily.    Marland Kitchen docusate sodium (COLACE) 100 MG capsule Take 100 mg by mouth daily.    . fluticasone (FLONASE) 50 MCG/ACT nasal spray Place 2 sprays into both nostrils daily as needed for allergies or rhinitis. 16 g prn  . levothyroxine (SYNTHROID, LEVOTHROID) 25 MCG tablet TAKE 1 TABLET BY MOUTH  DAILY BEFORE BREAKFAST 90 tablet 3  . loratadine (CLARITIN) 10 MG tablet Take 1 tablet (10 mg total) by mouth daily as needed.    Marland Kitchen losartan (COZAAR) 50 MG tablet TAKE 1 TABLET BY MOUTH  DAILY 90 tablet 0  . Melatonin 3 MG TABS Take 3-6 mg by mouth at bedtime as needed.    . naproxen sodium (RA NAPROXEN SODIUM) 220 MG tablet Take by mouth as needed.     . nitroGLYCERIN (NITROSTAT) 0.4 MG SL tablet Place 1 tablet (0.4 mg total) under the tongue every 5 (five) minutes as needed. 25 tablet 12  . simvastatin (ZOCOR) 20 MG tablet TAKE 1 TABLET BY MOUTH AT  BEDTIME 90 tablet 1  . traMADol (ULTRAM) 50 MG tablet Take 1 tablet (50 mg total) by mouth 3 (three) times daily as needed. 90 tablet 1   No current facility-administered medications on file prior to visit.      Review of Systems  Constitutional: Negative for activity change, appetite change, fatigue, fever and unexpected weight change.  HENT: Negative for congestion, ear pain, rhinorrhea, sinus pressure and sore throat.   Eyes: Negative for pain, redness and visual disturbance.  Respiratory: Negative for cough, shortness of breath and wheezing.   Cardiovascular: Negative for chest pain and palpitations.  Gastrointestinal: Negative for abdominal pain, blood in stool, constipation and diarrhea.  Endocrine: Negative for polydipsia and polyuria.   Genitourinary: Negative for dysuria, frequency and urgency.  Musculoskeletal: Positive for back pain. Negative for arthralgias and myalgias.  Skin: Negative for pallor and rash.  Allergic/Immunologic: Negative for environmental allergies.  Neurological: Negative for dizziness, syncope, weakness and headaches.       Poor balance  Hematological: Negative for adenopathy. Does not bruise/bleed easily.  Psychiatric/Behavioral: Negative for decreased concentration and dysphoric mood. The patient is not nervous/anxious.        Objective:   Physical Exam Constitutional:      General:  She is not in acute distress.    Appearance: Normal appearance. She is normal weight. She is not ill-appearing.  HENT:     Head: Normocephalic and atraumatic.  Eyes:     General: No scleral icterus.    Extraocular Movements: Extraocular movements intact.     Conjunctiva/sclera: Conjunctivae normal.     Pupils: Pupils are equal, round, and reactive to light.  Neck:     Musculoskeletal: Normal range of motion. No neck rigidity.  Cardiovascular:     Rate and Rhythm: Normal rate.     Pulses: Normal pulses.  Pulmonary:     Effort: Pulmonary effort is normal. No respiratory distress.     Breath sounds: Normal breath sounds. No wheezing.  Lymphadenopathy:     Cervical: No cervical adenopathy.  Skin:    General: Skin is warm and dry.     Comments: Skin tear on RLE - over shin  Oval in shape with small skin flap intact inferiorly  Wet but not actively bleeding No redness or swelling  Nl perf and rom of leg  Pt does not c/o tenderness  Neurological:     Mental Status: She is alert. Mental status is at baseline.     Gait: Gait abnormal.     Comments: Uses walker for gait stability  Psychiatric:        Mood and Affect: Mood normal.     Comments: Mood is normal  Poor historian-family helps           Assessment & Plan:   Problem List Items Addressed This Visit      Musculoskeletal and Integument    Skin tear of lower leg without complication    Recent injury w/o s/s of infection  utd tetanus imm Dressed with non stick telfa pad and gauze (no tape on skin)  Rev care inst- keep clean with soap and water Do not submerge  abx oint and non stick covering  Watch for redness/swelling/drainage or pain  Update if not starting to improve in a week or if worsening   Family voiced understanding       Other Visit Diagnoses    Need for influenza vaccination    -  Primary   Relevant Orders   Flu Vaccine QUAD 6+ mos PF IM (Fluarix Quad PF) (Completed)

## 2019-02-11 NOTE — Assessment & Plan Note (Signed)
Recent injury w/o s/s of infection  utd tetanus imm Dressed with non stick telfa pad and gauze (no tape on skin)  Rev care inst- keep clean with soap and water Do not submerge  abx oint and non stick covering  Watch for redness/swelling/drainage or pain  Update if not starting to improve in a week or if worsening   Family voiced understanding

## 2019-02-16 ENCOUNTER — Other Ambulatory Visit: Payer: Self-pay | Admitting: Family Medicine

## 2019-02-16 ENCOUNTER — Encounter: Payer: Self-pay | Admitting: Family Medicine

## 2019-02-18 ENCOUNTER — Ambulatory Visit (INDEPENDENT_AMBULATORY_CARE_PROVIDER_SITE_OTHER): Payer: Medicare Other | Admitting: Family Medicine

## 2019-02-18 ENCOUNTER — Other Ambulatory Visit: Payer: Self-pay

## 2019-02-18 ENCOUNTER — Encounter: Payer: Self-pay | Admitting: Family Medicine

## 2019-02-18 VITALS — BP 136/60 | HR 67 | Temp 98.4°F | Ht 60.0 in | Wt 129.3 lb

## 2019-02-18 DIAGNOSIS — T148XXA Other injury of unspecified body region, initial encounter: Secondary | ICD-10-CM | POA: Diagnosis not present

## 2019-02-18 DIAGNOSIS — L089 Local infection of the skin and subcutaneous tissue, unspecified: Secondary | ICD-10-CM

## 2019-02-18 MED ORDER — CEPHALEXIN 500 MG PO CAPS: 500.0000 mg | ORAL_CAPSULE | Freq: Two times a day (BID) | ORAL | 0 refills | Status: DC

## 2019-02-18 NOTE — Progress Notes (Signed)
This visit was conducted in person.  BP 136/60 (BP Location: Left Arm, Patient Position: Sitting, Cuff Size: Normal)   Pulse 67   Temp 98.4 F (36.9 C) (Temporal)   Ht 5' (1.524 m)   Wt 129 lb 5 oz (58.7 kg)   SpO2 96%   BMI 25.25 kg/m    CC: check leg wound Subjective:    Patient ID: [name], female    DOB: 01/10/1928, 85 y.o.   MRN: [id number]  HPI: [name] is a 85 y.o. female presenting on 02/18/2019 for Wound Check (C/o right lower leg wound is not improving.  Says wound is draining more yellow/brownish.  Also, seems to have more redness around wound. Seen a few weeks ago and has followed suggested tx.  Pt accompanied by daughter, [name]. )   Saw Dr Glori Bickers last week, note reviewed.  DOI: 02/09/2019 fell in yard, scraped leg. Fell in the dirt. She has been treating with abx ointment and gauze without benefit. She has been washing with soap and water twice daily. Starting to drain more. Some redness around wound.   No fevers/chills, nausea.      Relevant past medical, surgical, family and social history reviewed and updated as indicated. Interim medical history since our last visit reviewed. Allergies and medications reviewed and updated. Outpatient Medications Prior to Visit  Medication Sig Dispense Refill  . Acetaminophen (TYLENOL 8 HOUR PO) Take by mouth as needed.    Marland Kitchen aspirin 81 MG tablet Take 81 mg by mouth daily.     Marland Kitchen augmented betamethasone dipropionate (DIPROLENE-AF) 0.05 % ointment Apply 1 application topically as needed.     . cholecalciferol (VITAMIN D) 1000 units tablet Take 1,000 Units by mouth daily.    . cyanocobalamin 1000 MCG tablet Take 1,000 mcg by mouth daily.    . fluticasone (FLONASE) 50 MCG/ACT nasal spray Place 2 sprays into both nostrils daily as needed for allergies or rhinitis. 16 g prn  . levothyroxine (SYNTHROID) 25 MCG tablet TAKE 1 TABLET BY MOUTH  DAILY BEFORE BREAKFAST 90 tablet 0  . loratadine (CLARITIN) 10 MG tablet Take 1 tablet  (10 mg total) by mouth daily as needed.    Marland Kitchen losartan (COZAAR) 50 MG tablet TAKE 1 TABLET BY MOUTH  DAILY 90 tablet 0  . Melatonin 3 MG TABS Take 3-6 mg by mouth at bedtime as needed.    . naproxen sodium (RA NAPROXEN SODIUM) 220 MG tablet Take by mouth as needed.     . nitroGLYCERIN (NITROSTAT) 0.4 MG SL tablet Place 1 tablet (0.4 mg total) under the tongue every 5 (five) minutes as needed. 25 tablet 12  . simvastatin (ZOCOR) 20 MG tablet TAKE 1 TABLET BY MOUTH AT  BEDTIME 90 tablet 1  . traMADol (ULTRAM) 50 MG tablet Take 1 tablet (50 mg total) by mouth 3 (three) times daily as needed. 90 tablet 1  . docusate sodium (COLACE) 100 MG capsule Take 100 mg by mouth daily.     No facility-administered medications prior to visit.      Per HPI unless specifically indicated in ROS section below Review of Systems Objective:    BP 136/60 (BP Location: Left Arm, Patient Position: Sitting, Cuff Size: Normal)   Pulse 67   Temp 98.4 F (36.9 C) (Temporal)   Ht 5' (1.524 m)   Wt 129 lb 5 oz (58.7 kg)   SpO2 96%   BMI 25.25 kg/m   Wt Readings from Last 3 Encounters:  02/18/19 129 lb 5 oz (58.7 kg)  02/11/19 130 lb 2 oz (59 kg)  07/10/18 127 lb 4 oz (57.7 kg)    Physical Exam Vitals signs and nursing note reviewed.  Constitutional:      General: She is not in acute distress.    Appearance: Normal appearance.     Comments: Ambulates with rollator  Musculoskeletal:     Right lower leg: No edema.     Left lower leg: No edema.  Skin:    General: Skin is warm and dry.     Findings: Erythema and wound present.          Comments: 9cm x 3cm wound with granulation tissue present on right lateral lower leg, with significant surrounding erythema, without drainage.   Neurological:     Mental Status: She is alert.  Psychiatric:        Mood and Affect: Mood normal.        Behavior: Behavior normal.    Wound care: Wound cleaned with sterile saline solution, dressed with triple abx ointment and  covered with nonstick gauze and kerlex, pt tolerated well. Reviewed wound care instructions with daughter.    Lab Results  Component Value Date   CREATININE 1.08 03/19/2018   BUN 28 (H) 03/19/2018   NA 139 03/19/2018   K 4.6 03/19/2018   CL 104 03/19/2018   CO2 26 03/19/2018    Assessment & Plan:   Problem List Items Addressed This Visit    Infected skin tear - Primary    Concern for infection given marked erythema surrounding wound. Wound redressed, reviewed with daughter how to dress, advised change dressing twice daily until f/u in office. Will start keflex tid abx course. Daughter requests f/u with Dr Damita Dunnings for Monday.       Relevant Medications   cephALEXin (KEFLEX) 500 MG capsule       Meds ordered this encounter  Medications  . DISCONTD: cephALEXin (KEFLEX) 500 MG capsule    Sig: Take 1 capsule (500 mg total) by mouth 2 (two) times daily.    Dispense:  21 capsule    Refill:  0  . cephALEXin (KEFLEX) 500 MG capsule    Sig: Take 1 capsule (500 mg total) by mouth 2 (two) times daily.    Dispense:  21 capsule    Refill:  0   No orders of the defined types were placed in this encounter.  Patient instructions: Return Monday for wound check with Dr Damita Dunnings.  Start antibiotic keflex three times daily with meals sent to pharmacy. Continue dressing change twice daily with topical antibiotic.  Let us know if any concerns prior to next visit  Follow up plan: No follow-ups on file.  Ria Bush, MD

## 2019-02-18 NOTE — Assessment & Plan Note (Signed)
Concern for infection given marked erythema surrounding wound. Wound redressed, reviewed with daughter how to dress, advised change dressing twice daily until f/u in office. Will start keflex tid abx course. Daughter requests f/u with Dr Damita Dunnings for Monday.

## 2019-02-18 NOTE — Patient Instructions (Addendum)
Return Monday for wound check with Dr Damita Dunnings.  Start antibiotic keflex three times daily with meals sent to pharmacy. Continue dressing change twice daily with topical antibiotic.  Let us know if any concerns prior to next visit   Wound Infection A wound infection happens when tiny organisms (microorganisms) start to grow in a wound. A wound infection is most often caused by bacteria. Infection can cause the wound to break open or worsen. Wound infection needs treatment. If a wound infection is left untreated, complications can occur. Untreated wound infections may lead to an infection in the bloodstream (septicemia) or a bone infection (osteomyelitis). What are the causes? This condition is most often caused by bacteria growing in a wound. Other microorganisms, like yeast and fungi, can also cause wound infections. What increases the risk? The following factors may make you more likely to develop this condition:  Having a weak body defense system (immune system).  Having diabetes.  Taking steroid medicines for a long time (chronic use).  Smoking.  Being an older person.  Being overweight.  Taking chemotherapy medicines. What are the signs or symptoms? Symptoms of this condition include:  Having more redness, swelling, or pain at the wound site.  Having more blood or fluid at the wound site.  A bad smell coming from a wound or bandage (dressing).  Having a fever.  Feeling tired or fatigued.  Having warmth at or around the wound.  Having pus at the wound site. How is this diagnosed? This condition is diagnosed with a medical history and physical exam. You may also have a wound culture or blood tests or both. How is this treated? This condition is usually treated with an antibiotic medicine.  The infection should improve 24-48 hours after you start antibiotics.  After 24-48 hours, redness around the wound should stop spreading, and the wound should be less  painful. Follow these instructions at home: Medicines  Take or apply over-the-counter and prescription medicines only as told by your health care provider.  If you were prescribed an antibiotic medicine, take or apply it as told by your health care provider. Do not stop using the antibiotic even if you start to feel better. Wound care   Clean the wound each day, or as told by your health care provider. ? Wash the wound with mild soap and water. ? Rinse the wound with water to remove all soap. ? Pat the wound dry with a clean towel. Do not rub it.  Follow instructions from your health care provider about how to take care of your wound. Make sure you: ? Wash your hands with soap and water before and after you change your dressing. If soap and water are not available, use hand sanitizer. ? Change your dressing as told by your health care provider. ? Leave stitches (sutures), skin glue, or adhesive strips in place if your wound has been closed. These skin closures may need to stay in place for 2 weeks or longer. If adhesive strip edges start to loosen and curl up, you may trim the loose edges. Do not remove adhesive strips completely unless your health care provider tells you to do that. Some wounds are left open to heal on their own.  Check your wound every day for signs of infection. Watch for: ? More redness, swelling, or pain. ? More fluid or blood. ? Warmth. ? Pus or a bad smell. General instructions  Keep the dressing dry until your health care provider says it can  be removed.  Do not take baths, swim, or use a hot tub until your health care provider approves. Ask your health care provider if you may take showers. You may only be allowed to take sponge baths.  Raise (elevate) the injured area above the level of your heart while you are sitting or lying down.  Do not scratch or pick at the wound.  Keep all follow-up visits as told by your health care provider. This is  important. Contact a health care provider if:  Your pain is not controlled with medicine.  You have more redness, swelling, or pain around your wound.  You have more fluid or blood coming from your wound.  Your wound feels warm to the touch.  You have pus coming from your wound.  You continue to notice a bad smell coming from your wound or your dressing.  Your wound that was closed breaks open. Get help right away if:  You have a red streak going away from your wound.  You have a fever. Summary  A wound infection happens when tiny organisms (microorganisms) start to grow in a wound.  This condition is usually treated with an antibiotic medicine.  Follow instructions from your health care provider about how to take care of your wound.  Contact a health care provider if your wound infection does not begin to improve in 24-48 hours, or your symptoms worsen.  Keep all follow-up visits as told by your health care provider. This is important. This information is not intended to replace advice given to you by your health care provider. Make sure you discuss any questions you have with your health care provider. Document Released: 03/10/2003 Document Revised: 01/21/2018 Document Reviewed: 01/21/2018 Elsevier Patient Education  2020 [employer].

## 2019-02-23 ENCOUNTER — Ambulatory Visit: Payer: Medicare Other | Admitting: Family Medicine

## 2019-02-24 ENCOUNTER — Other Ambulatory Visit: Payer: Self-pay

## 2019-02-24 ENCOUNTER — Ambulatory Visit (INDEPENDENT_AMBULATORY_CARE_PROVIDER_SITE_OTHER): Payer: Medicare Other | Admitting: Family Medicine

## 2019-02-24 ENCOUNTER — Encounter: Payer: Self-pay | Admitting: Family Medicine

## 2019-02-24 DIAGNOSIS — S81811D Laceration without foreign body, right lower leg, subsequent encounter: Secondary | ICD-10-CM

## 2019-02-24 NOTE — Progress Notes (Signed)
Recheck right leg wound.  Still on keflex.  Locally sore but better than last week.  No fevers.  No vomiting, no diarrhea.  A little drainage now but this is a lot less then prev.    We talked about using a walker and fall risk reduction.  Meds, vitals, and allergies reviewed.   ROS: Per HPI unless specifically indicated in ROS section   nad Right leg with 9 x 3 cm laceration with minimal pain changes around age.  No spreading or alarming erythema.  She does have granulation tissue forming across the wound.  No purulent drainage.  No significant discharge.  Covered with Neosporin and nonstick bandages and then wrapped per routine.  Tolerated well.

## 2019-02-24 NOTE — Patient Instructions (Addendum)
Finish the antibiotics and keep changing the dressing daily.   If fevers or more pain, then let me know.  Rinse with soapy then clean water, then pat dry.  Don't scrub.  Update me early next week.    Take care.  I am always glad to see you.  If this is clearly better next week, then we don't have to recheck you.   It is currently 9x3 cm.

## 2019-02-25 DIAGNOSIS — S81811A Laceration without foreign body, right lower leg, initial encounter: Secondary | ICD-10-CM | POA: Insufficient documentation

## 2019-02-25 NOTE — Assessment & Plan Note (Signed)
Appears to be healing normally at this point.  Would continue Keflex for now as is.  Continue changing bandage daily with Neosporin and nonstick dressing.  Update me as needed.  See after visit summary.  We can always recheck her if needed.

## 2019-02-27 DIAGNOSIS — R0602 Shortness of breath: Secondary | ICD-10-CM | POA: Diagnosis not present

## 2019-02-27 DIAGNOSIS — I213 ST elevation (STEMI) myocardial infarction of unspecified site: Secondary | ICD-10-CM | POA: Diagnosis not present

## 2019-02-27 DIAGNOSIS — R002 Palpitations: Secondary | ICD-10-CM | POA: Diagnosis not present

## 2019-02-27 DIAGNOSIS — I498 Other specified cardiac arrhythmias: Secondary | ICD-10-CM | POA: Diagnosis not present

## 2019-03-04 ENCOUNTER — Ambulatory Visit (INDEPENDENT_AMBULATORY_CARE_PROVIDER_SITE_OTHER): Payer: Medicare Other | Admitting: *Deleted

## 2019-03-04 DIAGNOSIS — I495 Sick sinus syndrome: Secondary | ICD-10-CM | POA: Diagnosis not present

## 2019-03-04 LAB — CUP PACEART REMOTE DEVICE CHECK
Battery Remaining Longevity: 122 mo
Battery Remaining Percentage: 95.5 %
Battery Voltage: 2.99 V
Brady Statistic AP VP Percent: 1 %
Brady Statistic AP VS Percent: 50 %
Brady Statistic AS VP Percent: 1 %
Brady Statistic AS VS Percent: 50 %
Brady Statistic RA Percent Paced: 50 %
Brady Statistic RV Percent Paced: 1 %
Date Time Interrogation Session: 20200908060012
Implantable Lead Implant Date: 20080331
Implantable Lead Implant Date: 20160503
Implantable Lead Location: 753859
Implantable Lead Location: 753860
Implantable Pulse Generator Implant Date: 20160503
Lead Channel Impedance Value: 360 Ohm
Lead Channel Impedance Value: 410 Ohm
Lead Channel Pacing Threshold Amplitude: 0.875 V
Lead Channel Pacing Threshold Amplitude: 1.25 V
Lead Channel Pacing Threshold Pulse Width: 0.4 ms
Lead Channel Pacing Threshold Pulse Width: 0.4 ms
Lead Channel Sensing Intrinsic Amplitude: 12 mV
Lead Channel Sensing Intrinsic Amplitude: 2.1 mV
Lead Channel Setting Pacing Amplitude: 1.875
Lead Channel Setting Pacing Amplitude: 2.5 V
Lead Channel Setting Pacing Pulse Width: 0.4 ms
Lead Channel Setting Sensing Sensitivity: 2 mV
Pulse Gen Model: 2240
Pulse Gen Serial Number: 7766577

## 2019-03-09 ENCOUNTER — Telehealth: Payer: Self-pay | Admitting: Family Medicine

## 2019-03-09 NOTE — Telephone Encounter (Signed)
Patient was seen on 02/24/19 for her leg.  Patient's daughter called and said patient's her leg does look better.  Bandage is being changed once a day, cleaned, and she puts Neosporin on it.  Daughter wants to know does the wound need to air out or keep bandage on it? Should she continue to change the bandage and how long should she continue to put on a bandage on it. Should patient follow up with Dr.Duncan?  Patient has a cpx scheduled in October.

## 2019-03-10 NOTE — Telephone Encounter (Signed)
As long as she has an open wound I would keep it covered with Neosporin and a nonstick bandage.  If it is getting better then I do not think she has to get rechecked here in the clinic now, but I would be glad to see her if needed.  Thanks.

## 2019-03-10 NOTE — Telephone Encounter (Signed)
Left detailed message on voicemail.  

## 2019-03-19 NOTE — Progress Notes (Signed)
Remote pacemaker transmission.   

## 2019-03-31 ENCOUNTER — Ambulatory Visit (INDEPENDENT_AMBULATORY_CARE_PROVIDER_SITE_OTHER): Payer: Medicare Other | Admitting: Family Medicine

## 2019-03-31 ENCOUNTER — Other Ambulatory Visit: Payer: Self-pay

## 2019-03-31 ENCOUNTER — Encounter: Payer: Self-pay | Admitting: Family Medicine

## 2019-03-31 DIAGNOSIS — S81811D Laceration without foreign body, right lower leg, subsequent encounter: Secondary | ICD-10-CM

## 2019-03-31 NOTE — Patient Instructions (Signed)
This looks like it has some residual irritation but not infection.  I would stop bandaging it at this point.  Update me as needed.  It may very slowly fade away.  Take care.  Glad to see you.   No change for visit.

## 2019-04-01 NOTE — Progress Notes (Signed)
Recheck right shin wound.  No fevers or chills.  Not draining now.  Has been bandaging the site daily with nonstick bandages.  Meds, vitals, and allergies reviewed.   ROS: Per HPI unless specifically indicated in ROS section   nad ncat She has 10 x 12 cm skin changes on the right shin.  The old wound appears to have healed but she has persistent postinflammatory hyperpigmentation with some peripheral speckling on the superior edge of the skin changes.  She does not have any spreading erythema or drainage.  No fluctuant mass.  No pus seen.

## 2019-04-01 NOTE — Assessment & Plan Note (Signed)
The skin tear is healed and she has some residual postinflammatory hyperpigmentation with what looks to be some concurrent irritation.  I would stop bandaging her leg at this point.  I would expect it to very slowly fade in.  Discussed.  See after visit summary.  Update [place] as needed.  She agrees.  No charge for visit as there was no intervention.

## 2019-04-04 ENCOUNTER — Other Ambulatory Visit: Payer: Self-pay | Admitting: Family Medicine

## 2019-04-07 ENCOUNTER — Other Ambulatory Visit: Payer: Self-pay | Admitting: Family Medicine

## 2019-04-07 ENCOUNTER — Other Ambulatory Visit (INDEPENDENT_AMBULATORY_CARE_PROVIDER_SITE_OTHER): Payer: Medicare Other

## 2019-04-07 DIAGNOSIS — M810 Age-related osteoporosis without current pathological fracture: Secondary | ICD-10-CM | POA: Diagnosis not present

## 2019-04-07 DIAGNOSIS — E039 Hypothyroidism, unspecified: Secondary | ICD-10-CM | POA: Diagnosis not present

## 2019-04-07 DIAGNOSIS — I10 Essential (primary) hypertension: Secondary | ICD-10-CM | POA: Diagnosis not present

## 2019-04-07 LAB — VITAMIN D 25 HYDROXY (VIT D DEFICIENCY, FRACTURES): VITD: 36.9 ng/mL (ref 30.00–100.00)

## 2019-04-07 LAB — LIPID PANEL
Cholesterol: 148 mg/dL (ref 0–200)
HDL: 46.2 mg/dL (ref >39.00)
LDL Cholesterol: 66 mg/dL (ref 0–99)
NonHDL: 102.18
Total CHOL/HDL Ratio: 3
Triglycerides: 181 mg/dL — ABNORMAL HIGH (ref 0.0–149.0)
VLDL: 36.2 mg/dL (ref 0.0–40.0)

## 2019-04-07 LAB — COMPREHENSIVE METABOLIC PANEL
ALT: 36 U/L — ABNORMAL HIGH (ref 0–35)
AST: 33 U/L (ref 0–37)
Albumin: 4.5 g/dL (ref 3.5–5.2)
Alkaline Phosphatase: 46 U/L (ref 39–117)
BUN: 20 mg/dL (ref 6–23)
CO2: 24 mEq/L (ref 19–32)
Calcium: 9.8 mg/dL (ref 8.4–10.5)
Chloride: 107 mEq/L (ref 96–112)
Creatinine, Ser: 1.1 mg/dL (ref 0.40–1.20)
GFR: 46.69 mL/min — ABNORMAL LOW (ref >60.00)
Glucose, Bld: 91 mg/dL (ref 70–99)
Potassium: 4.5 mEq/L (ref 3.5–5.1)
Sodium: 140 mEq/L (ref 135–145)
Total Bilirubin: 0.9 mg/dL (ref 0.2–1.2)
Total Protein: 7.2 g/dL (ref 6.0–8.3)

## 2019-04-07 LAB — TSH: TSH: 5.09 u[IU]/mL — ABNORMAL HIGH (ref 0.35–4.50)

## 2019-04-16 ENCOUNTER — Ambulatory Visit (INDEPENDENT_AMBULATORY_CARE_PROVIDER_SITE_OTHER): Payer: Medicare Other | Admitting: Family Medicine

## 2019-04-16 ENCOUNTER — Encounter: Payer: Self-pay | Admitting: Family Medicine

## 2019-04-16 ENCOUNTER — Other Ambulatory Visit: Payer: Self-pay

## 2019-04-16 DIAGNOSIS — E785 Hyperlipidemia, unspecified: Secondary | ICD-10-CM | POA: Diagnosis not present

## 2019-04-16 DIAGNOSIS — R413 Other amnesia: Secondary | ICD-10-CM | POA: Diagnosis not present

## 2019-04-16 DIAGNOSIS — Z0000 Encounter for general adult medical examination without abnormal findings: Secondary | ICD-10-CM | POA: Diagnosis not present

## 2019-04-16 DIAGNOSIS — I10 Essential (primary) hypertension: Secondary | ICD-10-CM

## 2019-04-16 DIAGNOSIS — E039 Hypothyroidism, unspecified: Secondary | ICD-10-CM | POA: Diagnosis not present

## 2019-04-16 DIAGNOSIS — Z7189 Other specified counseling: Secondary | ICD-10-CM

## 2019-04-16 MED ORDER — LEVOTHYROXINE SODIUM 25 MCG PO TABS: ORAL_TABLET | ORAL | 3 refills | Status: DC

## 2019-04-16 MED ORDER — SIMVASTATIN 20 MG PO TABS: 20.0000 mg | ORAL_TABLET | Freq: Every day | ORAL | 3 refills | Status: DC

## 2019-04-16 NOTE — Progress Notes (Signed)
I have personally reviewed the Medicare Annual Wellness questionnaire and have noted 1. The patient's medical and social history 2. Their use of alcohol, tobacco or illicit drugs 3. Their current medications and supplements 4. The patient's functional ability including ADL's, fall risks, home safety risks and hearing or visual             impairment. 5. Diet and physical activities 6. Evidence for depression or mood disorders  The patients weight, height, BMI have been recorded in the chart and visual acuity is per eye clinic.  I have made referrals, counseling and provided education to the patient based review of the above and I have provided the pt with a written personalized care plan for preventive services.  Provider list updated- see scanned forms.  Routine anticipatory guidance given to patient.  See health maintenance. The possibility exists that previously documented standard health maintenance information may have been brought forward from a previous encounter into this note.  If needed, that same information has been updated to reflect the current situation based on today's encounter.    Flu 2020 Shingles d/w pt.  PNA UTD Tetanus 2012 Colon CA screening NA given her age, she agrees.  Breast cancer screening d/w pt, defer 2020 per patient preference.  DXA declined, intolerant of fosamax.  Advance directive - daughter [name] designated if patient were incapacitated.  Cognitive function addressed- see scanned forms- and if abnormal then additional documentation follows.   Hypothyroidism.  Compliant.  No ADE on med.  TSH minimally elevated.  No neck mass.  No dysphagia.    Hypertension:    Using medication without problems or lightheadedness: yes Chest pain with exertion:no Edema:no Short of breath:no  Elevated Cholesterol: Using medications without problems:yes Muscle aches: some diffuse aches in the elbows, back, hands.  She likely has OA and not pain from statin.  Diet  compliance:  Encouraged.  Exercise: limited.    Memory loss.  She has some trouble with recall of the day of the week, compensated with calendar.  She couldn't tell any difference from being on/off donepezil.  A&O x3 today but 1/3 short term recall.  No red flag events.  Family is helping patient, she wants to stay at home as long as possible especially with covid concerns noted.  Home alert button info given to patient/daughter. She lives alone with daughter checking on patient daily.  Using walker at baseline.    PMH and SH reviewed  Meds, vitals, and allergies reviewed.   ROS: Per HPI.  Unless specifically indicated otherwise in HPI, the patient denies:  General: fever. Eyes: acute vision changes ENT: sore throat Cardiovascular: chest pain Respiratory: SOB GI: vomiting GU: dysuria Musculoskeletal: acute back pain Derm: acute rash Neuro: acute motor dysfunction Psych: worsening mood Endocrine: polydipsia Heme: bleeding Allergy: hayfever  GEN: nad, alert and oriented HEENT: ncat NECK: supple w/o LA CV: rrr. PULM: ctab, no inc wob ABD: soft, +bs EXT: no edema SKIN: no acute rash

## 2019-04-16 NOTE — Patient Instructions (Addendum)
Check with your insurance to see if they will cover the shingrix shot. Don't change your meds for now.  Update me as needed.  Keep using your walker in the meantime.  Take care.  Glad to see you.

## 2019-04-20 NOTE — Assessment & Plan Note (Signed)
Compliant.  No ADE on med.  TSH minimally elevated.  No neck mass.  No dysphagia.   Since she doesn't have sx, I would defer med change at this point.  She/daughter agree.

## 2019-04-20 NOTE — Assessment & Plan Note (Signed)
See above.  She has some trouble with recall of the day of the week, compensated with calendar.  She couldn't tell any difference from being on/off donepezil.  A&O x3 today but 1/3 short term recall.  No red flag events.  Family is helping patient, she wants to stay at home as long as possible especially with covid concerns noted.  Home alert button info given to patient/daughter. She lives alone with daughter checking on patient daily.  Using walker at baseline.  I would continue as is for now with daughter checking on patient and they'll update me as needed.  We specifically talked about home safety.

## 2019-04-20 NOTE — Assessment & Plan Note (Signed)
Reasonable to continue as is.  Labs d/w pt.

## 2019-04-20 NOTE — Assessment & Plan Note (Signed)
Would continue as is for now.  Minimal LFT elevation and improved from prev.  D/w pt.  She agrees.

## 2019-04-20 NOTE — Assessment & Plan Note (Signed)
Flu 2020 Shingles d/w pt.  PNA UTD Tetanus 2012 Colon CA screening NA given her age, she agrees.  Breast cancer screening d/w pt, defer 2020 per patient preference.  DXA declined, intolerant of fosamax.  Advance directive - daughter [name] designated if patient were incapacitated.  Cognitive function addressed- see scanned forms- and if abnormal then additional documentation follows.

## 2019-04-20 NOTE — Assessment & Plan Note (Signed)
Advance directive-daughter Tina designated if patient were incapacitated. 

## 2019-04-27 DIAGNOSIS — H2513 Age-related nuclear cataract, bilateral: Secondary | ICD-10-CM | POA: Diagnosis not present

## 2019-05-14 ENCOUNTER — Other Ambulatory Visit: Payer: Self-pay | Admitting: *Deleted

## 2019-05-14 ENCOUNTER — Telehealth: Payer: Self-pay | Admitting: Family Medicine

## 2019-05-14 MED ORDER — FLUTICASONE PROPIONATE 50 MCG/ACT NA SUSP: 2.0000 | Freq: Every day | NASAL | 99 refills | Status: AC | PRN

## 2019-05-14 NOTE — Telephone Encounter (Signed)
[name] (daughter) called needed to get a rx refilled for pt fluticasone  [facility] [phone]   She only wants 1 bottle at time.  She does not want 3 month supply at once

## 2019-06-03 ENCOUNTER — Ambulatory Visit (INDEPENDENT_AMBULATORY_CARE_PROVIDER_SITE_OTHER): Payer: Medicare Other | Admitting: *Deleted

## 2019-06-03 DIAGNOSIS — Z950 Presence of cardiac pacemaker: Secondary | ICD-10-CM | POA: Diagnosis not present

## 2019-06-03 LAB — CUP PACEART REMOTE DEVICE CHECK
Battery Remaining Longevity: 122 mo
Battery Remaining Percentage: 95.5 %
Battery Voltage: 2.99 V
Brady Statistic AP VP Percent: 1 %
Brady Statistic AP VS Percent: 53 %
Brady Statistic AS VP Percent: 1 %
Brady Statistic AS VS Percent: 47 %
Brady Statistic RA Percent Paced: 53 %
Brady Statistic RV Percent Paced: 1 %
Date Time Interrogation Session: 20201209020015
Implantable Lead Implant Date: 20080331
Implantable Lead Implant Date: 20160503
Implantable Lead Location: 753859
Implantable Lead Location: 753860
Implantable Pulse Generator Implant Date: 20160503
Lead Channel Impedance Value: 340 Ohm
Lead Channel Impedance Value: 450 Ohm
Lead Channel Pacing Threshold Amplitude: 0.75 V
Lead Channel Pacing Threshold Amplitude: 1.25 V
Lead Channel Pacing Threshold Pulse Width: 0.4 ms
Lead Channel Pacing Threshold Pulse Width: 0.4 ms
Lead Channel Sensing Intrinsic Amplitude: 12 mV
Lead Channel Sensing Intrinsic Amplitude: 2.8 mV
Lead Channel Setting Pacing Amplitude: 1.75 V
Lead Channel Setting Pacing Amplitude: 2.5 V
Lead Channel Setting Pacing Pulse Width: 0.4 ms
Lead Channel Setting Sensing Sensitivity: 2 mV
Pulse Gen Model: 2240
Pulse Gen Serial Number: 7766577

## 2019-07-11 NOTE — Progress Notes (Signed)
PPM remote 

## 2019-09-02 ENCOUNTER — Ambulatory Visit (INDEPENDENT_AMBULATORY_CARE_PROVIDER_SITE_OTHER): Payer: Medicare Other | Admitting: *Deleted

## 2019-09-02 DIAGNOSIS — Z950 Presence of cardiac pacemaker: Secondary | ICD-10-CM

## 2019-09-02 LAB — CUP PACEART REMOTE DEVICE CHECK
Battery Remaining Longevity: 121 mo
Battery Remaining Percentage: 95.5 %
Battery Voltage: 2.99 V
Brady Statistic AP VP Percent: 1 %
Brady Statistic AP VS Percent: 53 %
Brady Statistic AS VP Percent: 1 %
Brady Statistic AS VS Percent: 47 %
Brady Statistic RA Percent Paced: 53 %
Brady Statistic RV Percent Paced: 1 %
Date Time Interrogation Session: 20210310020013
Implantable Lead Implant Date: 20080331
Implantable Lead Implant Date: 20160503
Implantable Lead Location: 753859
Implantable Lead Location: 753860
Implantable Pulse Generator Implant Date: 20160503
Lead Channel Impedance Value: 340 Ohm
Lead Channel Impedance Value: 390 Ohm
Lead Channel Pacing Threshold Amplitude: 0.75 V
Lead Channel Pacing Threshold Amplitude: 1.25 V
Lead Channel Pacing Threshold Pulse Width: 0.4 ms
Lead Channel Pacing Threshold Pulse Width: 0.4 ms
Lead Channel Sensing Intrinsic Amplitude: 12 mV
Lead Channel Sensing Intrinsic Amplitude: 2.5 mV
Lead Channel Setting Pacing Amplitude: 1.75 V
Lead Channel Setting Pacing Amplitude: 2.5 V
Lead Channel Setting Pacing Pulse Width: 0.4 ms
Lead Channel Setting Sensing Sensitivity: 2 mV
Pulse Gen Model: 2240
Pulse Gen Serial Number: 7766577

## 2019-09-02 NOTE — Progress Notes (Signed)
PPM Remote  

## 2019-10-23 DIAGNOSIS — Z136 Encounter for screening for cardiovascular disorders: Secondary | ICD-10-CM | POA: Diagnosis not present

## 2019-10-23 DIAGNOSIS — Z1589 Genetic susceptibility to other disease: Secondary | ICD-10-CM | POA: Diagnosis not present

## 2019-10-23 DIAGNOSIS — I10 Essential (primary) hypertension: Secondary | ICD-10-CM | POA: Diagnosis not present

## 2019-10-23 DIAGNOSIS — E7800 Pure hypercholesterolemia, unspecified: Secondary | ICD-10-CM | POA: Diagnosis not present

## 2019-10-23 DIAGNOSIS — Z8249 Family history of ischemic heart disease and other diseases of the circulatory system: Secondary | ICD-10-CM | POA: Diagnosis not present

## 2019-10-23 DIAGNOSIS — I420 Dilated cardiomyopathy: Secondary | ICD-10-CM | POA: Diagnosis not present

## 2019-10-23 DIAGNOSIS — Z148 Genetic carrier of other disease: Secondary | ICD-10-CM | POA: Diagnosis not present

## 2019-10-23 DIAGNOSIS — I499 Cardiac arrhythmia, unspecified: Secondary | ICD-10-CM | POA: Diagnosis not present

## 2019-10-24 DIAGNOSIS — Z23 Encounter for immunization: Secondary | ICD-10-CM | POA: Diagnosis not present

## 2019-11-08 DIAGNOSIS — B029 Zoster without complications: Secondary | ICD-10-CM

## 2019-11-08 HISTORY — DX: Zoster without complications: B02.9

## 2019-11-20 ENCOUNTER — Telehealth: Payer: Self-pay | Admitting: Family Medicine

## 2019-11-20 NOTE — Telephone Encounter (Signed)
Patient daughter called back  Advised of Dr Damita Dunnings message. [name] voiced understanding .

## 2019-11-20 NOTE — Telephone Encounter (Signed)
Message left for patient to return my call.  

## 2019-11-20 NOTE — Telephone Encounter (Signed)
Noted  

## 2019-11-20 NOTE — Telephone Encounter (Signed)
Patinet's Daughter Ronni Rumble She stated that the patient is scheduled to receive her 2nd covid vaccine tomorrow Patient was recently seen in the [facility] on may 16th for Shingles. And started taking Valaciclovir on the 26th. They wanted to know if she should postpone getting the covid vaccine tomorrow     [name] stated if she does not answer to please leave a message on her V/M [phone]

## 2019-11-20 NOTE — Telephone Encounter (Signed)
Assuming she doesn't have a fever or current prednisone tx, then it should be okay to get the 2nd covid vaccine dose.  This is assuming she feels well enough to get it done.  Thanks.

## 2019-11-21 DIAGNOSIS — Z23 Encounter for immunization: Secondary | ICD-10-CM | POA: Diagnosis not present

## 2019-12-02 ENCOUNTER — Ambulatory Visit (INDEPENDENT_AMBULATORY_CARE_PROVIDER_SITE_OTHER): Payer: Medicare Other | Admitting: *Deleted

## 2019-12-02 DIAGNOSIS — R001 Bradycardia, unspecified: Secondary | ICD-10-CM | POA: Diagnosis not present

## 2019-12-02 LAB — CUP PACEART REMOTE DEVICE CHECK
Battery Remaining Longevity: 120 mo
Battery Remaining Percentage: 95.5 %
Battery Voltage: 2.99 V
Brady Statistic AP VP Percent: 1 %
Brady Statistic AP VS Percent: 54 %
Brady Statistic AS VP Percent: 1 %
Brady Statistic AS VS Percent: 46 %
Brady Statistic RA Percent Paced: 53 %
Brady Statistic RV Percent Paced: 1 %
Date Time Interrogation Session: 20210609021732
Implantable Lead Implant Date: 20080331
Implantable Lead Implant Date: 20160503
Implantable Lead Location: 753859
Implantable Lead Location: 753860
Implantable Pulse Generator Implant Date: 20160503
Lead Channel Impedance Value: 340 Ohm
Lead Channel Impedance Value: 360 Ohm
Lead Channel Pacing Threshold Amplitude: 0.875 V
Lead Channel Pacing Threshold Amplitude: 1.25 V
Lead Channel Pacing Threshold Pulse Width: 0.4 ms
Lead Channel Pacing Threshold Pulse Width: 0.4 ms
Lead Channel Sensing Intrinsic Amplitude: 12 mV
Lead Channel Sensing Intrinsic Amplitude: 2.9 mV
Lead Channel Setting Pacing Amplitude: 1.875
Lead Channel Setting Pacing Amplitude: 2.5 V
Lead Channel Setting Pacing Pulse Width: 0.4 ms
Lead Channel Setting Sensing Sensitivity: 2 mV
Pulse Gen Model: 2240
Pulse Gen Serial Number: 7766577

## 2019-12-03 NOTE — Progress Notes (Signed)
Remote pacemaker transmission.   

## 2020-02-19 ENCOUNTER — Other Ambulatory Visit: Payer: Self-pay | Admitting: Family Medicine

## 2020-02-22 ENCOUNTER — Telehealth: Payer: Self-pay | Admitting: Family Medicine

## 2020-02-22 NOTE — Telephone Encounter (Signed)
Left detailed message on voicemail of daughter.

## 2020-02-22 NOTE — Telephone Encounter (Signed)
Patient's daughter called in stating [name] called Friday in regards to getting a glucose monitor. States they received a call in regards to monitor or brace. Family believes it is spam and wants to make sure that PCP does or does not want patient to start something like this. Please advise and Call back is [phone].

## 2020-03-02 ENCOUNTER — Ambulatory Visit (INDEPENDENT_AMBULATORY_CARE_PROVIDER_SITE_OTHER): Payer: Medicare Other | Admitting: *Deleted

## 2020-03-02 DIAGNOSIS — R001 Bradycardia, unspecified: Secondary | ICD-10-CM

## 2020-03-02 LAB — CUP PACEART REMOTE DEVICE CHECK
Battery Remaining Longevity: 116 mo
Battery Remaining Percentage: 95.5 %
Battery Voltage: 2.99 V
Brady Statistic AP VP Percent: 1 %
Brady Statistic AP VS Percent: 55 %
Brady Statistic AS VP Percent: 1 %
Brady Statistic AS VS Percent: 45 %
Brady Statistic RA Percent Paced: 54 %
Brady Statistic RV Percent Paced: 1 %
Date Time Interrogation Session: 20210908020012
Implantable Lead Implant Date: 20080331
Implantable Lead Implant Date: 20160503
Implantable Lead Location: 753859
Implantable Lead Location: 753860
Implantable Pulse Generator Implant Date: 20160503
Lead Channel Impedance Value: 210 Ohm
Lead Channel Impedance Value: 360 Ohm
Lead Channel Pacing Threshold Amplitude: 0.875 V
Lead Channel Pacing Threshold Amplitude: 1.25 V
Lead Channel Pacing Threshold Pulse Width: 0.4 ms
Lead Channel Pacing Threshold Pulse Width: 0.4 ms
Lead Channel Sensing Intrinsic Amplitude: 12 mV
Lead Channel Sensing Intrinsic Amplitude: 2.7 mV
Lead Channel Setting Pacing Amplitude: 1.875
Lead Channel Setting Pacing Amplitude: 2.5 V
Lead Channel Setting Pacing Pulse Width: 0.4 ms
Lead Channel Setting Sensing Sensitivity: 2 mV
Pulse Gen Model: 2240
Pulse Gen Serial Number: 7766577

## 2020-03-03 NOTE — Progress Notes (Signed)
Remote pacemaker transmission.   

## 2020-04-11 ENCOUNTER — Other Ambulatory Visit: Payer: Self-pay

## 2020-04-11 ENCOUNTER — Other Ambulatory Visit: Payer: Self-pay | Admitting: Family Medicine

## 2020-04-11 ENCOUNTER — Other Ambulatory Visit (INDEPENDENT_AMBULATORY_CARE_PROVIDER_SITE_OTHER): Payer: Medicare Other

## 2020-04-11 DIAGNOSIS — E785 Hyperlipidemia, unspecified: Secondary | ICD-10-CM

## 2020-04-11 DIAGNOSIS — E039 Hypothyroidism, unspecified: Secondary | ICD-10-CM | POA: Diagnosis not present

## 2020-04-11 DIAGNOSIS — M810 Age-related osteoporosis without current pathological fracture: Secondary | ICD-10-CM

## 2020-04-11 LAB — TSH: TSH: 5.54 u[IU]/mL — ABNORMAL HIGH (ref 0.35–4.50)

## 2020-04-11 LAB — LIPID PANEL
Cholesterol: 156 mg/dL (ref 0–200)
HDL: 45.2 mg/dL (ref >39.00)
LDL Cholesterol: 71 mg/dL (ref 0–99)
NonHDL: 111.03
Total CHOL/HDL Ratio: 3
Triglycerides: 199 mg/dL — ABNORMAL HIGH (ref 0.0–149.0)
VLDL: 39.8 mg/dL (ref 0.0–40.0)

## 2020-04-11 LAB — COMPREHENSIVE METABOLIC PANEL
ALT: 61 U/L — ABNORMAL HIGH (ref 0–35)
AST: 50 U/L — ABNORMAL HIGH (ref 0–37)
Albumin: 4.7 g/dL (ref 3.5–5.2)
Alkaline Phosphatase: 46 U/L (ref 39–117)
BUN: 17 mg/dL (ref 6–23)
CO2: 27 mEq/L (ref 19–32)
Calcium: 10 mg/dL (ref 8.4–10.5)
Chloride: 103 mEq/L (ref 96–112)
Creatinine, Ser: 1.04 mg/dL (ref 0.40–1.20)
GFR: 47.02 mL/min — ABNORMAL LOW (ref >60.00)
Glucose, Bld: 94 mg/dL (ref 70–99)
Potassium: 4.5 mEq/L (ref 3.5–5.1)
Sodium: 139 mEq/L (ref 135–145)
Total Bilirubin: 1.4 mg/dL — ABNORMAL HIGH (ref 0.2–1.2)
Total Protein: 7.2 g/dL (ref 6.0–8.3)

## 2020-04-11 LAB — VITAMIN D 25 HYDROXY (VIT D DEFICIENCY, FRACTURES): VITD: 32.25 ng/mL (ref 30.00–100.00)

## 2020-04-19 ENCOUNTER — Encounter: Payer: Self-pay | Admitting: Family Medicine

## 2020-04-19 ENCOUNTER — Other Ambulatory Visit: Payer: Self-pay

## 2020-04-19 ENCOUNTER — Ambulatory Visit (INDEPENDENT_AMBULATORY_CARE_PROVIDER_SITE_OTHER): Payer: Medicare Other | Admitting: Family Medicine

## 2020-04-19 ENCOUNTER — Other Ambulatory Visit: Payer: Self-pay | Admitting: Family Medicine

## 2020-04-19 VITALS — BP 160/60 | HR 76 | Temp 96.8°F | Ht 60.0 in | Wt 133.2 lb

## 2020-04-19 DIAGNOSIS — R7989 Other specified abnormal findings of blood chemistry: Secondary | ICD-10-CM | POA: Diagnosis not present

## 2020-04-19 DIAGNOSIS — E039 Hypothyroidism, unspecified: Secondary | ICD-10-CM | POA: Diagnosis not present

## 2020-04-19 DIAGNOSIS — Z7189 Other specified counseling: Secondary | ICD-10-CM

## 2020-04-19 DIAGNOSIS — E785 Hyperlipidemia, unspecified: Secondary | ICD-10-CM | POA: Diagnosis not present

## 2020-04-19 DIAGNOSIS — K760 Fatty (change of) liver, not elsewhere classified: Secondary | ICD-10-CM | POA: Diagnosis not present

## 2020-04-19 DIAGNOSIS — I10 Essential (primary) hypertension: Secondary | ICD-10-CM

## 2020-04-19 DIAGNOSIS — Z23 Encounter for immunization: Secondary | ICD-10-CM

## 2020-04-19 DIAGNOSIS — Z0000 Encounter for general adult medical examination without abnormal findings: Secondary | ICD-10-CM | POA: Diagnosis not present

## 2020-04-19 MED ORDER — LEVOTHYROXINE SODIUM 50 MCG PO TABS
ORAL_TABLET | ORAL | 3 refills | Status: DC
Start: 2020-04-19 — End: 2020-11-08

## 2020-04-19 NOTE — Progress Notes (Signed)
This visit occurred during the SARS-CoV-2 public health emergency.  Safety protocols were in place, including screening questions prior to the visit, additional usage of staff PPE, and extensive cleaning of exam room while observing appropriate contact time as indicated for disinfecting solutions.  I have personally reviewed the Medicare Annual Wellness questionnaire and have noted 1. The patient's medical and social history 2. Their use of alcohol, tobacco or illicit drugs 3. Their current medications and supplements 4. The patient's functional ability including ADL's, fall risks, home safety risks and hearing or visual             impairment. 5. Diet and physical activities 6. Evidence for depression or mood disorders  The patients weight, height, BMI have been recorded in the chart and visual acuity is per eye clinic.  I have made referrals, counseling and provided education to the patient based review of the above and I have provided the pt with a written personalized care plan for preventive services.  Provider list updated- see scanned forms.  Routine anticipatory guidance given to patient.  See health maintenance. The possibility exists that previously documented standard health maintenance information may have been brought forward from a previous encounter into this note.  If needed, that same information has been updated to reflect the current situation based on today's encounter.    Flu 2021 Shingles discussed with patient PNA up-to-date Tetanus date 2012 COVID vaccine 2021 Colon cancer screening not due given her age. Breast cancer screening declined by patient. DXA declined by patient. Advance directive-daughter [name] designated if patient were incapacitated. Cognitive function addressed- see scanned forms- and if abnormal then additional documentation follows.   LFT elevation.  H/o echogenic liver consistent with fatty infiltration.  Still on statin.  Rare tylenol use.   Discussed holding statin for now.  See avs.    Hypertension:    Using medication without problems or lightheadedness: yes Chest pain with exertion:no Edema:no Short of breath:no  Elevated Cholesterol: Using medications without problems: yes Muscle aches:  Some back pain but likely not from statin.  Diet compliance: d/w pt.   Exercise: d/w pt.    TSH elevation d/w pt.  She occ misses a dose.  We talked about usage, esp with early AM dosing, and not taking it with a large meal.  See memory discussion below.    She usually doesn't want to use her walker, d/w pt.    We talked about her memory.  Previous note discussed with patient. =============================== She has some trouble with recall of the day of the week, compensated with calendar. She couldn't tell any difference from being on/off donepezil. A&O x3 today but 1/3 short term recall. No red flag events. Family is helping patient, she wants to stay at home as long as possible especially with covid concerns noted. Home alert button info given to patient/daughter. She lives alone with daughter checking on patient daily. Using walker at baseline. I would continue as is for now with daughter checking on patient and they'll update me as needed. We specifically talked about home safety.  =============================== 1 out of 3 recall today.  She is oriented to month date and year.  Fall risk discussed with patient.  Cautions discussed with patient and daughter.  I asked him to consider what her goals are regarding placement and safety.  She likely wants to stay at home for now and she may end up needing increasing help at home.  Her daughter is supportive.  The goal  is to intervene before she has a significant decline.  I asked the patient to consider this in the meantime and involve her daughter in decision making process.  PMH and SH reviewed  Meds, vitals, and allergies reviewed.   ROS: Per HPI.  Unless specifically indicated  otherwise in HPI, the patient denies:  General: fever. Eyes: acute vision changes ENT: sore throat Cardiovascular: chest pain Respiratory: SOB GI: vomiting GU: dysuria Musculoskeletal: acute back pain Derm: acute rash Neuro: acute motor dysfunction Psych: worsening mood Endocrine: polydipsia Heme: bleeding Allergy: hayfever  GEN: nad, alert and oriented HEENT: ncat NECK: supple w/o LA CV: rrr. PULM: ctab, no inc wob ABD: soft, +bs EXT: no edema SKIN: no acute rash

## 2020-04-19 NOTE — Patient Instructions (Addendum)
Stop simvastatin for now.  Increase the thyroid medicine for now.  Recheck labs in about 2 months.  Nonfasting lab appointment.   Take care.  Glad to see you.

## 2020-04-19 NOTE — Telephone Encounter (Signed)
Electronic refill request. Simvastatin Last office visit:   04/19/2020 Last Filled:   ?  Not on current meds list  Electronic refill request. Levothyroxine 25 mcg Last office visit:   04/19/2020 Last Filled:   New Rx issued today at [facility]

## 2020-04-20 NOTE — Assessment & Plan Note (Signed)
TSH elevation d/w pt.  She occ misses a dose.  We talked about usage, esp with early AM dosing, and not taking it with a large meal.  Would increase to 50 mcg daily.  We can recheck her TSH when we recheck her LFTs.

## 2020-04-20 NOTE — Telephone Encounter (Signed)
Denied both prescriptions.  This was addressed at most recent office visit and family is aware.  Simvastatin is held in the meantime and thyroid dose has been changed.  Thanks.

## 2020-04-20 NOTE — Assessment & Plan Note (Signed)
LFT elevation.  H/o echogenic liver consistent with fatty infiltration.  Still on statin.  Rare tylenol use.  Discussed holding statin for now.  See avs.

## 2020-04-20 NOTE — Assessment & Plan Note (Signed)
Advance directive-daughter [name] designated if patient were incapacitated.

## 2020-04-20 NOTE — Assessment & Plan Note (Signed)
Flu 2021 Shingles discussed with patient PNA up-to-date Tetanus date 2012 COVID vaccine 2021 Colon cancer screening not due given her age. Breast cancer screening declined by patient. DXA declined by patient. Advance directive-daughter [name] designated if patient were incapacitated. Cognitive function addressed- see scanned forms- and if abnormal then additional documentation follows.

## 2020-04-20 NOTE — Assessment & Plan Note (Signed)
Continue losartan.  Labs discussed with patient.

## 2020-04-27 ENCOUNTER — Other Ambulatory Visit: Payer: Self-pay | Admitting: Family Medicine

## 2020-04-28 NOTE — Telephone Encounter (Signed)
Pharmacy requests refill on: Losartan Potassium 50 mg   LAST REFILL: 02/19/2020 LAST OV: 04/19/2020 NEXT OV: 04/27/2021 PHARMACY: [facility]

## 2020-05-30 DIAGNOSIS — H2513 Age-related nuclear cataract, bilateral: Secondary | ICD-10-CM | POA: Diagnosis not present

## 2020-06-01 ENCOUNTER — Ambulatory Visit (INDEPENDENT_AMBULATORY_CARE_PROVIDER_SITE_OTHER): Payer: Medicare Other

## 2020-06-01 DIAGNOSIS — R001 Bradycardia, unspecified: Secondary | ICD-10-CM | POA: Diagnosis not present

## 2020-06-02 LAB — CUP PACEART REMOTE DEVICE CHECK
Battery Remaining Longevity: 91 mo
Battery Remaining Percentage: 95.5 %
Battery Voltage: 2.99 V
Brady Statistic AP VP Percent: 1 %
Brady Statistic AP VS Percent: 55 %
Brady Statistic AS VP Percent: 1 %
Brady Statistic AS VS Percent: 45 %
Brady Statistic RA Percent Paced: 54 %
Brady Statistic RV Percent Paced: 1 %
Date Time Interrogation Session: 20211208024147
Implantable Lead Implant Date: 20080331
Implantable Lead Implant Date: 20160503
Implantable Lead Location: 753859
Implantable Lead Location: 753860
Implantable Pulse Generator Implant Date: 20160503
Lead Channel Impedance Value: 240 Ohm
Lead Channel Impedance Value: 340 Ohm
Lead Channel Pacing Threshold Amplitude: 0.875 V
Lead Channel Pacing Threshold Amplitude: 1.25 V
Lead Channel Pacing Threshold Pulse Width: 0.4 ms
Lead Channel Pacing Threshold Pulse Width: 0.4 ms
Lead Channel Sensing Intrinsic Amplitude: 1.9 mV
Lead Channel Sensing Intrinsic Amplitude: 12 mV
Lead Channel Setting Pacing Amplitude: 1.875
Lead Channel Setting Pacing Amplitude: 5 V
Lead Channel Setting Pacing Pulse Width: 0.5 ms
Lead Channel Setting Sensing Sensitivity: 2 mV
Pulse Gen Model: 2240
Pulse Gen Serial Number: 7766577

## 2020-06-06 DIAGNOSIS — Z23 Encounter for immunization: Secondary | ICD-10-CM | POA: Diagnosis not present

## 2020-06-14 NOTE — Progress Notes (Signed)
Remote pacemaker transmission.   

## 2020-06-28 ENCOUNTER — Other Ambulatory Visit: Payer: Medicare Other

## 2020-06-30 ENCOUNTER — Other Ambulatory Visit: Payer: Self-pay

## 2020-06-30 ENCOUNTER — Other Ambulatory Visit (INDEPENDENT_AMBULATORY_CARE_PROVIDER_SITE_OTHER): Payer: Medicare Other

## 2020-06-30 DIAGNOSIS — R7989 Other specified abnormal findings of blood chemistry: Secondary | ICD-10-CM | POA: Diagnosis not present

## 2020-06-30 DIAGNOSIS — E039 Hypothyroidism, unspecified: Secondary | ICD-10-CM | POA: Diagnosis not present

## 2020-06-30 LAB — TSH: TSH: 0.45 u[IU]/mL (ref 0.35–4.50)

## 2020-06-30 LAB — HEPATIC FUNCTION PANEL
ALT: 42 U/L — ABNORMAL HIGH (ref 0–35)
AST: 30 U/L (ref 0–37)
Albumin: 4.5 g/dL (ref 3.5–5.2)
Alkaline Phosphatase: 53 U/L (ref 39–117)
Bilirubin, Direct: 0.1 mg/dL (ref 0.0–0.3)
Total Bilirubin: 0.8 mg/dL (ref 0.2–1.2)
Total Protein: 6.9 g/dL (ref 6.0–8.3)

## 2020-07-04 ENCOUNTER — Encounter: Payer: Self-pay | Admitting: Family Medicine

## 2020-07-05 ENCOUNTER — Telehealth: Payer: Self-pay | Admitting: Family Medicine

## 2020-07-05 NOTE — Telephone Encounter (Signed)
Spoke with pt's daughter [name] regarding Lab results.  Leamon Arnt

## 2020-07-05 NOTE — Telephone Encounter (Signed)
Patient's daughter called in to discuss lab results with Mardene Celeste. Please advise.

## 2020-08-02 ENCOUNTER — Telehealth: Payer: Self-pay | Admitting: Family Medicine

## 2020-08-02 NOTE — Telephone Encounter (Signed)
Pt's daughter called to ask about a 4th dose of the covid vaccine. She states she saw on the news where they were discussing needing a 4th dose. She wanted to know if Dr. Damita Dunnings recommends that [name] get a 4th dose. Please advise.

## 2020-08-02 NOTE — Telephone Encounter (Signed)
Please get her 3rd dose listed in EMR.  Wouldn't need 4th covid dose at this point.  This guidelines may change in the future, but not yet.  Thanks.

## 2020-08-03 NOTE — Telephone Encounter (Signed)
LMTCB

## 2020-08-03 NOTE — Telephone Encounter (Signed)
Spoke with daughter [name] and made aware 4th dose not needed at this time. Updated 3rd vaccine in system which was done on 06/06/21 [facility] at [facility] in [place]

## 2020-08-30 ENCOUNTER — Ambulatory Visit (INDEPENDENT_AMBULATORY_CARE_PROVIDER_SITE_OTHER): Payer: Medicare Other

## 2020-08-30 DIAGNOSIS — I495 Sick sinus syndrome: Secondary | ICD-10-CM

## 2020-08-30 LAB — CUP PACEART REMOTE DEVICE CHECK
Battery Remaining Longevity: 90 mo
Battery Remaining Percentage: 95.5 %
Battery Voltage: 2.99 V
Brady Statistic AP VP Percent: 1 %
Brady Statistic AP VS Percent: 55 %
Brady Statistic AS VP Percent: 1 %
Brady Statistic AS VS Percent: 44 %
Brady Statistic RA Percent Paced: 54 %
Brady Statistic RV Percent Paced: 1 %
Date Time Interrogation Session: 20220308020020
Implantable Lead Implant Date: 20080331
Implantable Lead Implant Date: 20160503
Implantable Lead Location: 753859
Implantable Lead Location: 753860
Implantable Pulse Generator Implant Date: 20160503
Lead Channel Impedance Value: 230 Ohm
Lead Channel Impedance Value: 340 Ohm
Lead Channel Pacing Threshold Amplitude: 0.75 V
Lead Channel Pacing Threshold Amplitude: 1.25 V
Lead Channel Pacing Threshold Pulse Width: 0.4 ms
Lead Channel Pacing Threshold Pulse Width: 0.4 ms
Lead Channel Sensing Intrinsic Amplitude: 12 mV
Lead Channel Sensing Intrinsic Amplitude: 2.4 mV
Lead Channel Setting Pacing Amplitude: 1.75 V
Lead Channel Setting Pacing Amplitude: 5 V
Lead Channel Setting Pacing Pulse Width: 0.5 ms
Lead Channel Setting Sensing Sensitivity: 2 mV
Pulse Gen Model: 2240
Pulse Gen Serial Number: 7766577

## 2020-09-07 NOTE — Progress Notes (Signed)
Remote pacemaker transmission.   

## 2020-09-20 ENCOUNTER — Telehealth: Payer: Self-pay | Admitting: Family Medicine

## 2020-09-20 NOTE — Telephone Encounter (Signed)
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 \  Please advise

## 2020-09-21 NOTE — Telephone Encounter (Signed)
Letter done.  Please send if she can't get it via [employer].  Thanks.

## 2020-09-22 ENCOUNTER — Ambulatory Visit (INDEPENDENT_AMBULATORY_CARE_PROVIDER_SITE_OTHER): Payer: Medicare Other | Admitting: Family Medicine

## 2020-09-22 ENCOUNTER — Telehealth: Payer: Self-pay | Admitting: Family Medicine

## 2020-09-22 ENCOUNTER — Encounter: Payer: Self-pay | Admitting: Family Medicine

## 2020-09-22 ENCOUNTER — Other Ambulatory Visit: Payer: Self-pay

## 2020-09-22 DIAGNOSIS — R413 Other amnesia: Secondary | ICD-10-CM

## 2020-09-22 NOTE — Telephone Encounter (Signed)
Notified [name] that letter is done and will give to her when she brings patient in for her appt today.

## 2020-09-22 NOTE — Telephone Encounter (Signed)
Patient left copy of Living Will . It was place on mail cart

## 2020-09-22 NOTE — Progress Notes (Signed)
This visit occurred during the SARS-CoV-2 public health emergency.  Safety protocols were in place, including screening questions prior to the visit, additional usage of staff PPE, and extensive cleaning of exam room while observing appropriate contact time as indicated for disinfecting solutions.  Here today with her daughter, with concerns about memory and "state of mind."  She had locked herself out of her house twice and needed help getting back in. Patient was reportedly more argumentative.  Family is checking on patient daily, mult times per day.  When it snowed back in January, she was outside w/o getting dressed for the snow.    We talked about living situation, possible placement, payment options, etc.  Family and patient needs input and information.    Donepezil didn't seem to help much prev.  Daughter has tried to make her home as safe as possible with ramps, etc.    Meds, vitals, and allergies reviewed.  ROS: Per HPI unless specifically indicated in ROS section   GEN: nad, alert and oriented to self.  She knows the year and the month but her recall is slower than previous.  3 out of 3 on attention but 0 out of 3 on recall and she said 2+3 = 6. HEENT: ncat NECK: supple w/o LA CV: rrr. PULM: ctab, no inc wob ABD: soft, +bs EXT: no edema SKIN: Well-perfused.

## 2020-09-22 NOTE — Patient Instructions (Signed)
Let met get more information and we'll be in touch.  Take care.  Glad to see you.

## 2020-09-23 NOTE — Telephone Encounter (Signed)
Noted. Thanks.

## 2020-09-25 NOTE — Assessment & Plan Note (Signed)
With increasing care needs.  The 3 options would be to get extra help at home, moved to a different location, or leave things as is.  The third option is not likely a good idea and we talked about her situation.  I will check to see what we can get in terms of information for the patient and her daughter about possible placement/options and we will go from there.  We opted not to change her medications at this point.  All agree with plan.  30 minutes were devoted to patient care in this encounter (this includes time spent reviewing the patient's file/history, interviewing and examining the patient, counseling/reviewing plan with patient).

## 2020-09-27 ENCOUNTER — Telehealth: Payer: Self-pay

## 2020-09-27 DIAGNOSIS — R413 Other amnesia: Secondary | ICD-10-CM

## 2020-09-27 NOTE — Telephone Encounter (Signed)
-----   Message from Tonia Ghent, MD sent at 09/25/2020  5:17 PM EDT ----- Regarding: Needs social worker input regarding placement options. Is there anyone (like a Education officer, museum) who can provide information to the patient and especially her daughter about potential options for placement?  Her memory is getting worse.  I can fill out an [place] 2 if needed but her daughter needs information regarding placement.  Please let me know.  Thanks.  Brigitte Pulse

## 2020-09-27 NOTE — Telephone Encounter (Signed)
I spoke with Anastasiya to get some help with this. She said you will need to do a [facility] referral : [id number] for social worker with reason (dx code).

## 2020-09-28 ENCOUNTER — Telehealth: Payer: Self-pay | Admitting: *Deleted

## 2020-09-28 NOTE — Chronic Care Management (AMB) (Signed)
  Chronic Care Management   Note  09/28/2020 Name: [name] MRN: [id number] DOB: 30-May-1928  [name] is a 87 y.o. year old female who is a primary care patient of Tonia Ghent, MD. I reached out to Royetta Crochet by phone today in response to a referral sent by [name]'s PCP, Dr. Damita Dunnings.     [name] was given information about Chronic Care Management services today including:  1. CCM service includes personalized support from designated clinical staff supervised by her physician, including individualized plan of care and coordination with other care providers 2. 24/7 contact phone numbers for assistance for urgent and routine care needs. 3. Service will only be billed when office clinical staff spend 20 minutes or more in a month to coordinate care. 4. Only one practitioner may furnish and bill the service in a calendar month. 5. The patient may stop CCM services at any time (effective at the end of the month) by phone call to the office staff. 6. The patient will be responsible for cost sharing (co-pay) of up to 20% of the service fee (after annual deductible is met).  [name]  verbally agreed to assistance and services provided by embedded care coordination/care management team today.  Follow up plan: Telephone appointment with care management team member scheduled for:10/04/2020  Shameka Aggarwal  Care Guide, Embedded Care Coordination [place]  Care Management

## 2020-09-28 NOTE — Telephone Encounter (Signed)
Done. Thanks.

## 2020-09-28 NOTE — Addendum Note (Signed)
Addended by: Tonia Ghent on: 09/28/2020 11:08 AM   Modules accepted: Orders

## 2020-10-04 ENCOUNTER — Ambulatory Visit (INDEPENDENT_AMBULATORY_CARE_PROVIDER_SITE_OTHER): Payer: Medicare Other | Admitting: *Deleted

## 2020-10-04 DIAGNOSIS — M549 Dorsalgia, unspecified: Secondary | ICD-10-CM

## 2020-10-04 DIAGNOSIS — M810 Age-related osteoporosis without current pathological fracture: Secondary | ICD-10-CM

## 2020-10-04 DIAGNOSIS — R413 Other amnesia: Secondary | ICD-10-CM

## 2020-10-04 DIAGNOSIS — I10 Essential (primary) hypertension: Secondary | ICD-10-CM | POA: Diagnosis not present

## 2020-10-06 NOTE — Patient Instructions (Signed)
Visit Information   PATIENT GOALS:  Goals Addressed            This Visit's Progress   . Find Help in My Community       Timeframe:  Long-Range Goal Priority:  High Start Date:    10/04/2020                         Expected End Date:      12/22/2020                 Follow Up Date 11/02/2020   -contact and visit area [employer] discussed for possible day program/activities to consider -speak with Attorney previously used re: property and/or seek Elder Training and development officer guidance re: finances, Medicaid eligibility, etc - begin a notebook of services in my neighborhood or community - think ahead to make sure my need does not become an emergency/ have a back-up plan - make a list of family or friends that I can call and consider talking with them about a vacation/date for a respite time for you -consider/seek private pay caregiver options for times you may need to get away; for a few hours/days/week, etc -practice good "self care" as a caregiver and prevent burnout!    Why is this important?    Knowing how and where to find help for yourself or family in your neighborhood and community is an important skill.   You will want to take some steps to learn how.    Notes:        Consent to CCM Services: Ms. Gassen was given information about Chronic Care Management services today including:  1. CCM service includes personalized support from designated clinical staff supervised by her physician, including individualized plan of care and coordination with other care providers 2. 24/7 contact phone numbers for assistance for urgent and routine care needs. 3. Service will only be billed when office clinical staff spend 20 minutes or more in a month to coordinate care. 4. Only one practitioner may furnish and bill the service in a calendar month. 5. The patient may stop CCM services at any time (effective at the end of the month) by phone call to the office staff. 6. The patient will be  responsible for cost sharing (co-pay) of up to 20% of the service fee (after annual deductible is met).  Patient agreed to services and verbal consent obtained.   The patient verbalized understanding of instructions, educational materials, and care plan provided today and agreed to receive a mailed copy of patient instructions, educational materials, and care plan.   Telephone follow up appointment with care management team member scheduled for:11/02/2020  Eduard Clos MSW, LCSW Licensed Clinical Social Worker [place] [phone]  CLINICAL CARE PLAN: Patient Care Plan: Develop patient-centered plan of care to promote and enhance safety and overall quality of life    Problem Identified: Caregiver Stress     Long-Range Goal: Caregiver Coping Optimized   Start Date: 10/04/2020  Expected End Date: 12/22/2020  This Visit's Progress: On track  Priority: High  Note:   Current barriers:   . Patient in need of assistance with connecting to community resources for Level of care concerns, Social Isolation, Cognitive Deficits, Lacks knowledge of community resource: long term planning/placement, and caregiver support . Acknowledges deficits with meeting this unmet need . Patient is unable to independently navigate community resource options without care coordination support Clinical Goals: Patient/family will work with LCSW to  address needs related to pt's long term needs/planning  Clinical Interventions:  CSW spoke with pt's daughter/HCPOA, [name], by phone and completed assessment. Pt's daughter is primary caregiver; living next door on the family property and overseeing her mother's needs. CSW had a lengthy conversation with daughter about options for her mother. At this time, she understands the levels of care, costs/coverage and avenues to take.  At this time, she does not feel her mother needs to be moved into a facility setting and wants to further pursue the option of community  resources; adult day care, senior center day activities, etc.  [place] consult will be placed for assistance with community resources in the [place] and [place] areas.  CSW also spent time counseling and offering validation and support to pt's daughter who has been the sole caregiver since her sister passed away in 09/13/03 after a stroke.  "I don't even feel I can leave town because I look after everything for Juan (pt) and my aunt".  Pt's sister in law also lives on the property and is elderly (upper 14's).  CSW encouraged pt's daughter to feel empowered to go to the extended family and ask them to find time to offer her a week "off" so she can get away.  Alternative option might be to have the 2 ladies (the aunt and the pt) pay someone to be there in her absence... CSW stressed the importance of a healthy balance and for self care to which daughter appreciated yet minimized the need or ability to do.  Will follow up on this with her again as well.  . Collaboration with Tonia Ghent, MD regarding development and update of comprehensive plan of care as evidenced by provider attestation and co-signature . Inter-disciplinary care team collaboration (see longitudinal plan of care) . Assessment of needs, barriers , agencies contacted, as well as how impacting  . Review various resources, discussed options and provided daughter information about Department of Social Services (Medicaid, PCS),Insurance provider benefits, Elder Training and development officer, ALF/Memory Care and LTC/SNF options, Adult Day Care, Private duty care, Geophysicist/field seismologist and Chief Operating Officer support suggestions . Patient's daughter was interviewed and appropriate assessments performed . Referred patient to community resources care guide team for assistance with community based resources for senior center/adult day care options . Provided patient's  with information about levels of care, in home care/private pay, Medicaid, Elder  Law assistance, community resources   . Discussed plans with patient's daughter  for ongoing care management follow up and provided patient with direct contact information for care management team . Advised patient's daughter to seek legal guidance as appropriate regarding property/assets and potential Medicaid  . A voluntary and extensive discussion about advanced care planning including explanation and discussion of advanced was undertaken with the patient's daughter.   . Other interventions provided:Solution-Focused Strategies . Active listening / Reflection  . Emotional Supportive Provided . Problem [place]  . Psychoeducation and/or Health Education . Brief CBT . Grief Counseling . Teaching/Coaching Strategies  . Motivational Interviewing Patient Goals/Self-Care Activities:  -contact and visit area Psychiatric nurse discussed for possible day program/activities to consider -speak with Attorney previously used re: property and/or seek Elder Training and development officer guidance re: finances, Medicaid eligibility, etc - begin a notebook of services in my neighborhood or community - think ahead to make sure my need does not become an emergency/ have a back-up plan - make a list of family or friends that I can call and consider talking  with them about a vacation/date for a respite time for you -consider/seek private pay caregiver options for times you may need to get away; for a few hours/days/week, etc -practice good "self care" as a caregiver and prevent burnout!   Follow Up Plan: Appointment scheduled for SW follow up with client by phone on:  11/02/2020

## 2020-10-06 NOTE — Chronic Care Management (AMB) (Signed)
Chronic Care Management    Clinical Social Work Note  10/06/2020 Name: [name] MRN: [id number] DOB: [birth year]  [name] is a 87 y.o. year old female who is a primary care patient of Tonia Ghent, MD. The CCM team was consulted to assist the patient with chronic disease management and/or care coordination needs related to: [employer]  and Level of Care Concerns.  Engaged with patient's daughter.HCPOA, Lorraine Lax by telephone for initial visit in response to provider referral for social work chronic care management and care coordination services.  Consent to Services:  The patient was given information about Chronic Care Management services, agreed to services, and gave verbal consent prior to initiation of services.  Please see initial visit note for detailed documentation.  Patient agreed to services and consent obtained.   Assessment: Review of patient past medical history, allergies, medications, and health status, including review of relevant consultants reports was performed today as part of a comprehensive evaluation and provision of chronic care management and care coordination services.     SDOH (Social Determinants of Health) assessments and interventions performed:    Advanced Directives Status: See Care Plan for related entries.  CCM Care Plan  Allergies  Allergen Reactions  . Simvastatin     Presumed cause of LFT elevation.  . Fosamax [Alendronate Sodium] Other (See Comments)    Anxious and "I just didn't feel right" after each dose; 2.5 month trial    Outpatient Encounter Medications as of 10/04/2020  Medication Sig  . Acetaminophen (TYLENOL 8 HOUR PO) Take by mouth as needed.  Marland Kitchen aspirin 81 MG tablet Take 81 mg by mouth daily.  . cholecalciferol (VITAMIN D) 1000 units tablet Take 1,000 Units by mouth daily.  . cyanocobalamin 1000 MCG tablet Take 1,000 mcg by mouth daily.  Marland Kitchen docusate sodium (COLACE) 100 MG capsule Take 100 mg by mouth daily.  .  fluticasone (FLONASE) 50 MCG/ACT nasal spray Place 2 sprays into both nostrils daily as needed for allergies or rhinitis.  Marland Kitchen levothyroxine (SYNTHROID) 50 MCG tablet Take 1 tab daily  . losartan (COZAAR) 50 MG tablet TAKE 1 TABLET BY MOUTH  DAILY  . naproxen sodium (ALEVE) 220 MG tablet Take by mouth as needed.   . nitroGLYCERIN (NITROSTAT) 0.4 MG SL tablet Place 1 tablet (0.4 mg total) under the tongue every 5 (five) minutes as needed.  . polyethylene glycol (MIRALAX / GLYCOLAX) 17 g packet Take 17 g by mouth daily.  . traMADol (ULTRAM) 50 MG tablet Take 1 tablet (50 mg total) by mouth 3 (three) times daily as needed.   No facility-administered encounter medications on file as of 10/04/2020.    Patient Active Problem List   Diagnosis Date Noted  . Skin tear of right lower leg without complication 03/00/9233  . Memory loss 12/11/2015  . SSS (sick sinus syndrome) ([place]) 05/17/2015  . Hypothyroidism 11/02/2014  . PAF- none documented in years, not on anticoagulation 10/27/2014  . Fall at home 07/14/2014  . Bruit of right carotid artery 01/26/2014  . Medicare annual wellness visit, subsequent 12/22/2013  . Advance care planning 12/22/2013  . Hyperlipidemia 07/29/2013  . Fatty liver 06/04/2012  . Murmur, cardiac 11/28/2011  . Back pain 08/29/2011  . Osteoporosis 06/26/2011  . Vertigo 03/05/2011  . Neck pain 03/05/2011  . Rash 11/16/2010  . Essential hypertension, benign 11/16/2010  . CAD S/P PCI 2008 11/16/2010  . Pacemaker - dual chamber St. Jude gen change 2016 11/16/2010  . Tinnitus  of both ears 11/16/2010    Conditions to be addressed/monitored: Dementia; Limited social support, Level of care concerns and Social Isolation  Care Plan : Develop patient-centered plan of care to promote and enhance safety and overall quality of life  Updates made by Deirdre Peer, LCSW since 10/06/2020 12:00 AM    Problem: Caregiver Stress     Long-Range Goal: Caregiver Coping Optimized    Start Date: 10/04/2020  Expected End Date: 12/22/2020  This Visit's Progress: On track  Priority: High  Note:   Current barriers:   . Patient in need of assistance with connecting to community resources for Level of care concerns, Social Isolation, Cognitive Deficits, Lacks knowledge of community resource: long term planning/placement, and caregiver support . Acknowledges deficits with meeting this unmet need . Patient is unable to independently navigate community resource options without care coordination support Clinical Goals: Patient/family will work with LCSW to address needs related to pt's long term needs/planning  Clinical Interventions:  CSW spoke with pt's daughter/HCPOA, [name], by phone and completed assessment. Pt's daughter is primary caregiver; living next door on the family property and overseeing her mother's needs. CSW had a lengthy conversation with daughter about options for her mother. At this time, she understands the levels of care, costs/coverage and avenues to take.  At this time, she does not feel her mother needs to be moved into a facility setting and wants to further pursue the option of community resources; adult day care, senior center day activities, etc.  [place] consult will be placed for assistance with community resources in the [place] and [place] areas.  CSW also spent time counseling and offering validation and support to pt's daughter who has been the sole caregiver since her sister passed away in 10-06-03 after a stroke.  "I don't even feel I can leave town because I look after everything for [place] (pt) and my aunt".  Pt's sister in law also lives on the property and is elderly (upper 40's).  CSW encouraged pt's daughter to feel empowered to go to the extended family and ask them to find time to offer her a week "off" so she can get away.  Alternative option might be to have the 2 ladies (the aunt and the pt) pay someone to be there in her absence... CSW  stressed the importance of a healthy balance and for self care to which daughter appreciated yet minimized the need or ability to do.  Will follow up on this with her again as well.  . Collaboration with Tonia Ghent, MD regarding development and update of comprehensive plan of care as evidenced by provider attestation and co-signature . Inter-disciplinary care team collaboration (see longitudinal plan of care) . Assessment of needs, barriers , agencies contacted, as well as how impacting  . Review various resources, discussed options and provided daughter information about Department of Social Services (Medicaid, PCS),Insurance provider benefits, Elder Training and development officer, ALF/Memory Care and LTC/SNF options, Adult Day Care, Private duty care, Geophysicist/field seismologist and Chief Operating Officer support suggestions . Patient's daughter was interviewed and appropriate assessments performed . Referred patient to community resources care guide team for assistance with community based resources for senior center/adult day care options . Provided patient's  with information about levels of care, in home care/private pay, Medicaid, Elder Law assistance, community resources   . Discussed plans with patient's daughter  for ongoing care management follow up and provided patient with direct contact information for care management team .  Advised patient's daughter to seek legal guidance as appropriate regarding property/assets and potential Medicaid  . A voluntary and extensive discussion about advanced care planning including explanation and discussion of advanced was undertaken with the patient's daughter.   . Other interventions provided:Solution-Focused Strategies . Active listening / Reflection  . Emotional Supportive Provided . Problem [place]  . Psychoeducation and/or Health Education . Brief CBT . Grief Counseling . Teaching/Coaching Strategies  . Motivational Interviewing Patient  Goals/Self-Care Activities:  -contact and visit area Psychiatric nurse discussed for possible day program/activities to consider -speak with Attorney previously used re: property and/or seek Elder Training and development officer guidance re: finances, Medicaid eligibility, etc - begin a notebook of services in my neighborhood or community - think ahead to make sure my need does not become an emergency/ have a back-up plan - make a list of family or friends that I can call and consider talking with them about a vacation/date for a respite time for you -consider/seek private pay caregiver options for times you may need to get away; for a few hours/days/week, etc -practice good "self care" as a caregiver and prevent burnout!   Follow Up Plan: Appointment scheduled for SW follow up with client by phone on:  11/02/2020      Follow Up Plan: Appointment scheduled for SW follow up with client by phone on: 11/02/2020      Eduard Clos MSW, [place] Licensed Clinical Social Worker [place] [phone]

## 2020-10-12 ENCOUNTER — Telehealth: Payer: Self-pay

## 2020-10-12 NOTE — Telephone Encounter (Signed)
   Telephone encounter was:  Unsuccessful.  10/12/2020 Name: [name] MRN: [id number] DOB: July 20, 1927  Unsuccessful outbound call made today to assist with:  Caregiver Stress  Outreach Attempt:  1st Attempt  A HIPAA compliant voice message was left requesting a return call.  Instructed patient to call back at [phone].  Ransom Nickson, AAS Paralegal, [place] . Embedded Care Coordination [facility]  Care Management  [address], [place] [address] ??[email or web address]  ?? [phone]   [email or web address]

## 2020-10-14 ENCOUNTER — Telehealth: Payer: Self-pay

## 2020-10-14 NOTE — Telephone Encounter (Signed)
   Telephone encounter was:  Unsuccessful.  10/14/2020 Name: ALESSIA GONSALEZ MRN: [id number] DOB: 11-Sep-1927  Unsuccessful outbound call made today to assist with:  Caregiver Stress  Outreach Attempt:  2nd Attempt  Unable to leave message voicemail not set-up.  Edrian Melucci, AAS Paralegal, [place] . Embedded Care Coordination [facility]  Care Management  [address], [place] [address] ??[email or web address]  ?? [phone]   [email or web address]

## 2020-10-17 ENCOUNTER — Telehealth: Payer: Self-pay

## 2020-10-17 NOTE — Telephone Encounter (Signed)
   Telephone encounter was:  Unsuccessful.  10/17/2020 Name: [name] MRN: [id number] DOB: [birth year]  Unsuccessful outbound call made today to assist with:  Caregiver Stress  Outreach Attempt:  3rd Attempt.  Referral closed unable to contact patient.  Unable to leave message voicemail not set-up.  [name], AAS Paralegal, [place] . Embedded Care Coordination [facility]  Care Management  [address], [place] [address] ??[email or web address]  ?? [phone]   [email or web address]

## 2020-10-19 ENCOUNTER — Other Ambulatory Visit: Payer: Self-pay | Admitting: Family Medicine

## 2020-11-01 ENCOUNTER — Encounter: Payer: Self-pay | Admitting: Internal Medicine

## 2020-11-01 ENCOUNTER — Other Ambulatory Visit: Payer: Self-pay

## 2020-11-01 ENCOUNTER — Ambulatory Visit (INDEPENDENT_AMBULATORY_CARE_PROVIDER_SITE_OTHER): Payer: Medicare Other | Admitting: Internal Medicine

## 2020-11-01 VITALS — BP 156/60 | HR 60 | Ht 60.0 in | Wt 126.0 lb

## 2020-11-01 DIAGNOSIS — I495 Sick sinus syndrome: Secondary | ICD-10-CM | POA: Diagnosis not present

## 2020-11-01 DIAGNOSIS — Z950 Presence of cardiac pacemaker: Secondary | ICD-10-CM | POA: Diagnosis not present

## 2020-11-01 DIAGNOSIS — E039 Hypothyroidism, unspecified: Secondary | ICD-10-CM

## 2020-11-01 LAB — PACEMAKER DEVICE OBSERVATION

## 2020-11-01 NOTE — Patient Instructions (Addendum)
Medication Instructions:  - Your physician recommends that you continue on your current medications as directed. Please refer to the Current Medication list given to you today.  *If you need a refill on your cardiac medications before your next appointment, please call your pharmacy*   Lab Work: - Your physician recommends that you have lab work today: TSH   If you have labs (blood work) drawn today and your tests are completely normal, you will receive your results only by: Marland Kitchen MyChart Message (if you have MyChart) OR . A paper copy in the mail If you have any lab test that is abnormal or we need to change your treatment, we will call you to review the results.   Testing/Procedures: - none ordered   Follow-Up: At [facility], you and your health needs are our priority.  As part of our continuing mission to provide you with exceptional heart care, we have created designated Provider Care Teams.  These Care Teams include your primary Cardiologist (physician) and Advanced Practice Providers (APPs -  Physician Assistants and Nurse Practitioners) who all work together to provide you with the care you need, when you need it.  We recommend signing up for the patient portal called "MyChart".  Sign up information is provided on this After Visit Summary.  MyChart is used to connect with patients for Virtual Visits (Telemedicine).  Patients are able to view lab/test results, encounter notes, upcoming appointments, etc.  Non-urgent messages can be sent to your provider as well.   To learn more about what you can do with MyChart, go to [email or web address].    Your next appointment:   1 year(s)  The format for your next appointment:   In Person  Provider:   Virl Axe, MD   Other Instructions n/a

## 2020-11-01 NOTE — Progress Notes (Signed)
Patient Care Team: Tonia Ghent, MD as PCP - General (Family Medicine) Dingeldein, Remo Lipps, MD as Consulting Physician (Ophthalmology) Ronald Lobo, MD as Consulting Physician (Gastroenterology) Dasher, Rayvon Char, MD as Consulting Physician (Dermatology) Deirdre Peer, LCSW as Social Worker (Licensed Clinical Social Worker)   HPI   CC coronary disease and pacemeker  [name] is a 87 y.o. female Previously followed by Dr. Thermon Leyland with a pacemaker implanted for sinus node dysfunction with [facility] Jude generator change 2016.   History of coronary artery disease w/ stenting to her RCA 2008 in the context of an inferior wall MI.  She has a history of atrial fibrillation remotely but none documented of late and is not on anticoagulation  Echo EF 2014 normal aortic sclerosis without stenosis Myoview 2016 no ischemia  Records and Results Reviewed  Date Cr K TSH  8/18 1.07 4.2   10/21 1.04 4.5 0.45<<5.54   The patient denies chest pain, shortness of breath, nocturnal dyspnea, orthopnea or peripheral edema.  There have been no palpitations or syncope.  She has not active.  4 years ago and evaluation suggested using a walker inside and out; according to her daughter she refuses and the patient assents to that observation.  Not able to articulate why it is that she will not use her walker  Interval therapy initiated for hypothyroidism      Past Medical History:  Diagnosis Date  . Allergy   . Arthritis   . CAD (coronary artery disease)   . Fatty liver    on u/s 2010  . Heart murmur   . Hyperlipidemia   . Hypertension   . Insomnia   . Myocardial infarction ([place])   . Osteoporosis    dxa 2013  . PAF (paroxysmal atrial fibrillation) ([place])   . Shingles 11/08/2019   [facility]  . SSS (sick sinus syndrome) ([place])    St. Jude Permanent Pacemaker 09/23/06  . Symptomatic bradycardia     Past Surgical History:  Procedure Laterality Date  . BACK SURGERY    .  CARPAL TUNNEL RELEASE     left  . CHOLECYSTECTOMY    . CORONARY ANGIOPLASTY WITH STENT PLACEMENT  12/03/06   Stent to distal RCA  . CORONARY ANGIOPLASTY WITH STENT PLACEMENT  09/12/06   Stenting mid distal segment & PTCA alone distal RCA prox. to the posterior descending artery takeoff.  . CORONARY STENT PLACEMENT     RCA stent replaced   . EP IMPLANTABLE DEVICE N/A 10/26/2014   Procedure: PPM Generator Changeout;  Surgeon: Sanda Lynell Greenhouse, MD;  Location: [facility];  Service: Cardiovascular;  Laterality: N/A;  . EP IMPLANTABLE DEVICE N/A 10/26/2014   Procedure: Pacemaker Revision- new RV Lead;  Surgeon: Sanda Adel Neyer, MD;  Location: [facility];  Service: Cardiovascular;  Laterality: N/A;  . FOOT SURGERY     Left  . HERNIA REPAIR     R inguinal  . PACEMAKER INSERTION  09/23/06   St.Jude  . US ECHOCARDIOGRAPHY  11/26/11   prox. septal thickening, mild concentric LVH, mod. ca+ AOV,trace AI,mild to mod valvular aortic stenosis.    Current Outpatient Medications  Medication Sig Dispense Refill  . Acetaminophen (TYLENOL 8 HOUR PO) Take by mouth as needed.    Marland Kitchen aspirin 81 MG tablet Take 81 mg by mouth daily.    . cholecalciferol (VITAMIN D) 1000 units tablet Take 1,000 Units by mouth daily.    . cyanocobalamin 1000  MCG tablet Take 1,000 mcg by mouth daily.    Marland Kitchen docusate sodium (COLACE) 100 MG capsule Take 100 mg by mouth daily.    . fluticasone (FLONASE) 50 MCG/ACT nasal spray Place 2 sprays into both nostrils daily as needed for allergies or rhinitis. 16 g prn  . levothyroxine (SYNTHROID) 50 MCG tablet Take 1 tab daily 90 tablet 3  . losartan (COZAAR) 50 MG tablet TAKE 1 TABLET BY MOUTH  DAILY 90 tablet 3  . naproxen sodium (ALEVE) 220 MG tablet Take by mouth as needed.     . nitroGLYCERIN (NITROSTAT) 0.4 MG SL tablet Place 1 tablet (0.4 mg total) under the tongue every 5 (five) minutes as needed. 25 tablet 12  . polyethylene glycol (MIRALAX / GLYCOLAX) 17 g packet Take 17 g by  mouth daily.    . traMADol (ULTRAM) 50 MG tablet Take 1 tablet (50 mg total) by mouth 3 (three) times daily as needed. 90 tablet 1   No current facility-administered medications for this visit.    Allergies  Allergen Reactions  . Simvastatin     Presumed cause of LFT elevation.  . Fosamax [Alendronate Sodium] Other (See Comments)    Anxious and "I just didn't feel right" after each dose; 2.5 month trial      Review of Systems negative except from HPI and PMH  Physical Exam BP (!) 156/60   Pulse 60   Ht 5' (1.524 m)   Wt 126 lb (57.2 kg)   BMI 24.61 kg/m  Well developed and well nourished in no acute distress HENT normal Neck supple with JVP-flat Clear Device pocket well healed; without hematoma or erythema.  There is no tethering  Regular rate and rhythm, 2/6 murmur preserved S2 Abd-soft with active BS No Clubbing cyanosis   edema Skin-warm and dry A & Oriented  Grossly normal sensory and motor function  ECG   Apacing @ 68 ow normal   Assessment and  Plan  Coronary artery disease prior stenting  Pacemaker-St. 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  She however uses her pacing lead less than 0.1% of the time, it is chronic, so we will simply program her RV output accordingly and follow it  Along Battery longevity estimates of 4--11 years is related to the issues of ventricular pacing.  We should be at the far range as she ventricular paces almost never  Discussed the fall issue and encouraged her to be open and rethink the role of a walker so as to prevent further falls and potential fractures and being bedbound  Without symptoms of ischemia continue aspirin  She is euvolemic.  Blood pressure is elevated.  We will have her check it at home.  Synthroid was  recently adjusted and her TSH went from 5+--0.4.  We will recheck TSH today       .

## 2020-11-02 ENCOUNTER — Telehealth: Payer: Self-pay | Admitting: *Deleted

## 2020-11-02 ENCOUNTER — Telehealth: Payer: Medicare Other

## 2020-11-02 LAB — TSH: TSH: 0.297 u[IU]/mL — ABNORMAL LOW (ref 0.450–4.500)

## 2020-11-02 NOTE — Telephone Encounter (Signed)
  Care Management   Follow Up Note   11/02/2020 Name: [name] MRN: [id number] DOB: Jun 04, 1928   Referred by: Tonia Ghent, MD Reason for referral : No chief complaint on file.   An unsuccessful telephone outreach was attempted today. The patient was referred to the case management team for assistance with care management and care coordination.   Follow Up Plan: The care management team will reach out to the patient again over the next 3-4 days.   Eduard Clos MSW, LCSW Licensed Clinical Social Worker [place] [phone]

## 2020-11-08 ENCOUNTER — Telehealth: Payer: Medicare Other

## 2020-11-08 ENCOUNTER — Other Ambulatory Visit: Payer: Self-pay | Admitting: Family Medicine

## 2020-11-08 ENCOUNTER — Telehealth: Payer: Self-pay | Admitting: Internal Medicine

## 2020-11-08 DIAGNOSIS — E039 Hypothyroidism, unspecified: Secondary | ICD-10-CM

## 2020-11-08 MED ORDER — LEVOTHYROXINE SODIUM 50 MCG PO TABS: ORAL_TABLET | ORAL | Status: DC

## 2020-11-08 NOTE — Telephone Encounter (Signed)
Attempted to call the patient. No answer- I left a message to please call back.  

## 2020-11-08 NOTE — Telephone Encounter (Signed)
I spoke with the patient's daughter, [name] (ok per DPR), regarding the patient's TSH results.  I have advised her Dr. Caryl Comes has forwarded a copy of this lab work to Dr. Damita Dunnings and he has already seen this and made recommendations to change her levothyroxine dosing.  However, I have also advised these recommendations will need to come from Dr. Josefine Class office and they should be expecting a call from someone there.   [name] voices understanding and is agreeable.

## 2020-11-08 NOTE — Telephone Encounter (Signed)
Deboraha Sprang, MD  11/07/2020 3:21 PM EDT      Please Inform Patient that TSH is low and this is abnormal-- we will reach out to her PCP to see if he wants to adjust her levothyroxine  Thanks

## 2020-11-10 ENCOUNTER — Telehealth: Payer: Medicare Other

## 2020-11-14 ENCOUNTER — Telehealth: Payer: Medicare Other

## 2020-11-16 ENCOUNTER — Telehealth: Payer: Medicare Other | Admitting: *Deleted

## 2020-11-22 ENCOUNTER — Encounter: Payer: Medicare Other | Admitting: Internal Medicine

## 2020-11-29 ENCOUNTER — Ambulatory Visit (INDEPENDENT_AMBULATORY_CARE_PROVIDER_SITE_OTHER): Payer: Medicare Other

## 2020-11-29 DIAGNOSIS — I495 Sick sinus syndrome: Secondary | ICD-10-CM

## 2020-11-29 LAB — CUP PACEART REMOTE DEVICE CHECK
Battery Remaining Longevity: 110 mo
Battery Remaining Percentage: 95.5 %
Battery Voltage: 2.99 V
Brady Statistic AP VP Percent: 1 %
Brady Statistic AP VS Percent: 66 %
Brady Statistic AS VP Percent: 1 %
Brady Statistic AS VS Percent: 34 %
Brady Statistic RA Percent Paced: 64 %
Brady Statistic RV Percent Paced: 1 %
Date Time Interrogation Session: 20220607030828
Implantable Lead Implant Date: 20080331
Implantable Lead Implant Date: 20160503
Implantable Lead Location: 753859
Implantable Lead Location: 753860
Implantable Pulse Generator Implant Date: 20160503
Lead Channel Impedance Value: 210 Ohm
Lead Channel Impedance Value: 340 Ohm
Lead Channel Pacing Threshold Amplitude: 0.875 V
Lead Channel Pacing Threshold Amplitude: 2 V
Lead Channel Pacing Threshold Pulse Width: 0.4 ms
Lead Channel Pacing Threshold Pulse Width: 0.8 ms
Lead Channel Sensing Intrinsic Amplitude: 1.5 mV
Lead Channel Sensing Intrinsic Amplitude: 12 mV
Lead Channel Setting Pacing Amplitude: 1.875
Lead Channel Setting Pacing Amplitude: 2.5 V
Lead Channel Setting Pacing Pulse Width: 0.5 ms
Lead Channel Setting Sensing Sensitivity: 2 mV
Pulse Gen Model: 2240
Pulse Gen Serial Number: 7766577

## 2020-12-20 ENCOUNTER — Telehealth: Payer: Self-pay | Admitting: Family Medicine

## 2020-12-20 NOTE — Telephone Encounter (Signed)
Pt daughter Lorraine Lax calling regarding vaccines for patient  [facility] and was asking them to schedule a visit for PNA and Shingles vaccine  Patient is wanting to know: -Is PNA due? -Is shingles necessary -Do they need an Rx for either to have done at pharmacy?  Labs scheduled here in office - if needed to get vaccine then she wants to do this the same day as her upcoming lab rather than getting them at [facility].  Please advise, thanks.

## 2020-12-20 NOTE — Progress Notes (Signed)
Remote pacemaker transmission.   

## 2020-12-21 NOTE — Telephone Encounter (Signed)
She does not need a pneumonia vaccine.  She is up-to-date.  It is recommended but not mandatory to get the shingles vaccine.  I advised her to check with insurance to see if it will be cheaper here or at the pharmacy.  It is a 2 dose series and the doses have to be at least 2 months apart.  Thanks.

## 2020-12-22 NOTE — Telephone Encounter (Signed)
Spoke with daughter [name] and advised on below. She will take her mom to the pharmacy to have the shingles vaccine done and let us know when its been done.

## 2020-12-23 ENCOUNTER — Telehealth: Payer: Self-pay

## 2020-12-23 NOTE — Telephone Encounter (Signed)
[facility] alert received- low ventricular lead impedance in unipolar, 190 ohms  Pt last seen 11/01/20, noted rising RV threshold and concern for insulation breach.  RV lead programmed unipolar.  Pt is not device dependant, pacing in the V only 1% of the time.    We have now received daily alerts for low RV impendence.  Reviewed with Dr. Caryl Comes.  He advised to notify pt that it appears the RV lead is compromised, not currently an issue since she doesn't use it but we are turning off alerts so she will need to let us know if she feels poorly.    Attempted to reach patient, no answer.  LVM with device clinic # and hours to return phone call.

## 2020-12-23 NOTE — Telephone Encounter (Signed)
Spoke with pt daughter, [name].  (DPR on file).    Advised of RV lead concerns.  We are turning off alerts so will no longer be notified if it worsens, she will have to let us know if there is any change in patient condition.

## 2020-12-30 ENCOUNTER — Emergency Department
Admission: EM | Admit: 2020-12-30 | Discharge: 2020-12-30 | Disposition: A | Discharge status: Home / Self Care | Payer: Medicare Other | Attending: Emergency Medicine | Admitting: Emergency Medicine

## 2020-12-30 ENCOUNTER — Emergency Department: Payer: Medicare Other

## 2020-12-30 ENCOUNTER — Other Ambulatory Visit: Payer: Self-pay

## 2020-12-30 DIAGNOSIS — I739 Peripheral vascular disease, unspecified: Secondary | ICD-10-CM | POA: Diagnosis not present

## 2020-12-30 DIAGNOSIS — M5489 Other dorsalgia: Secondary | ICD-10-CM | POA: Diagnosis not present

## 2020-12-30 DIAGNOSIS — Z950 Presence of cardiac pacemaker: Secondary | ICD-10-CM | POA: Diagnosis not present

## 2020-12-30 DIAGNOSIS — S32512A Fracture of superior rim of left pubis, initial encounter for closed fracture: Secondary | ICD-10-CM | POA: Diagnosis not present

## 2020-12-30 DIAGNOSIS — S81812A Laceration without foreign body, left lower leg, initial encounter: Secondary | ICD-10-CM | POA: Diagnosis not present

## 2020-12-30 DIAGNOSIS — Z7982 Long term (current) use of aspirin: Secondary | ICD-10-CM | POA: Diagnosis not present

## 2020-12-30 DIAGNOSIS — W19XXXA Unspecified fall, initial encounter: Secondary | ICD-10-CM | POA: Diagnosis not present

## 2020-12-30 DIAGNOSIS — R5381 Other malaise: Secondary | ICD-10-CM | POA: Diagnosis not present

## 2020-12-30 DIAGNOSIS — I2510 Atherosclerotic heart disease of native coronary artery without angina pectoris: Secondary | ICD-10-CM | POA: Diagnosis not present

## 2020-12-30 DIAGNOSIS — Z79899 Other long term (current) drug therapy: Secondary | ICD-10-CM | POA: Insufficient documentation

## 2020-12-30 DIAGNOSIS — S90121A Contusion of right lesser toe(s) without damage to nail, initial encounter: Secondary | ICD-10-CM | POA: Diagnosis not present

## 2020-12-30 DIAGNOSIS — S8992XA Unspecified injury of left lower leg, initial encounter: Secondary | ICD-10-CM | POA: Diagnosis present

## 2020-12-30 DIAGNOSIS — Z23 Encounter for immunization: Secondary | ICD-10-CM | POA: Insufficient documentation

## 2020-12-30 DIAGNOSIS — M25532 Pain in left wrist: Secondary | ICD-10-CM | POA: Diagnosis not present

## 2020-12-30 DIAGNOSIS — S90129A Contusion of unspecified lesser toe(s) without damage to nail, initial encounter: Secondary | ICD-10-CM

## 2020-12-30 DIAGNOSIS — E039 Hypothyroidism, unspecified: Secondary | ICD-10-CM | POA: Insufficient documentation

## 2020-12-30 DIAGNOSIS — S9031XA Contusion of right foot, initial encounter: Secondary | ICD-10-CM | POA: Diagnosis not present

## 2020-12-30 DIAGNOSIS — S90119A Contusion of unspecified great toe without damage to nail, initial encounter: Secondary | ICD-10-CM | POA: Diagnosis not present

## 2020-12-30 DIAGNOSIS — I10 Essential (primary) hypertension: Secondary | ICD-10-CM | POA: Diagnosis not present

## 2020-12-30 DIAGNOSIS — S32592A Other specified fracture of left pubis, initial encounter for closed fracture: Secondary | ICD-10-CM | POA: Diagnosis not present

## 2020-12-30 DIAGNOSIS — F0390 Unspecified dementia without behavioral disturbance: Secondary | ICD-10-CM | POA: Diagnosis not present

## 2020-12-30 DIAGNOSIS — M19042 Primary osteoarthritis, left hand: Secondary | ICD-10-CM | POA: Diagnosis not present

## 2020-12-30 LAB — CBC
HCT: 42 % (ref 36.0–46.0)
Hemoglobin: 14.2 g/dL (ref 12.0–15.0)
MCH: 30.7 pg (ref 26.0–34.0)
MCHC: 33.8 g/dL (ref 30.0–36.0)
MCV: 90.7 fL (ref 80.0–100.0)
Platelets: 146 10*3/uL — ABNORMAL LOW (ref 150–400)
RBC: 4.63 MIL/uL (ref 3.87–5.11)
RDW: 13.7 % (ref 11.5–15.5)
WBC: 19.4 10*3/uL — ABNORMAL HIGH (ref 4.0–10.5)
nRBC: 0 % (ref 0.0–0.2)

## 2020-12-30 LAB — TROPONIN I (HIGH SENSITIVITY)
Troponin I (High Sensitivity): 29 ng/L — ABNORMAL HIGH (ref <18)
Troponin I (High Sensitivity): 29 ng/L — ABNORMAL HIGH (ref <18)

## 2020-12-30 LAB — URINALYSIS, COMPLETE (UACMP) WITH MICROSCOPIC
Bacteria, UA: NONE SEEN
Bilirubin Urine: NEGATIVE
Glucose, UA: NEGATIVE mg/dL
Hgb urine dipstick: NEGATIVE
Ketones, ur: 5 mg/dL — AB
Nitrite: NEGATIVE
Protein, ur: NEGATIVE mg/dL
Specific Gravity, Urine: 1.011 (ref 1.005–1.030)
pH: 5 (ref 5.0–8.0)

## 2020-12-30 LAB — BASIC METABOLIC PANEL
Anion gap: 12 (ref 5–15)
BUN: 21 mg/dL (ref 8–23)
CO2: 20 mmol/L — ABNORMAL LOW (ref 22–32)
Calcium: 9.4 mg/dL (ref 8.9–10.3)
Chloride: 102 mmol/L (ref 98–111)
Creatinine, Ser: 1.13 mg/dL — ABNORMAL HIGH (ref 0.44–1.00)
GFR, Estimated: 46 mL/min — ABNORMAL LOW (ref >60)
Glucose, Bld: 121 mg/dL — ABNORMAL HIGH (ref 70–99)
Potassium: 4.4 mmol/L (ref 3.5–5.1)
Sodium: 134 mmol/L — ABNORMAL LOW (ref 135–145)

## 2020-12-30 MED ORDER — TETANUS-DIPHTH-ACELL PERTUSSIS 5-2.5-18.5 LF-MCG/0.5 IM SUSY
0.5000 mL | PREFILLED_SYRINGE | Freq: Once | INTRAMUSCULAR | Status: AC
  Administered 2020-12-30: 0.5 mL via INTRAMUSCULAR
  Filled 2020-12-30: qty 0.5

## 2020-12-30 MED ORDER — SODIUM CHLORIDE 0.9 % IV BOLUS: 1000.0000 mL | Freq: Once | INTRAVENOUS | Status: DC

## 2020-12-30 NOTE — ED Notes (Signed)
Patient transported to X-ray 

## 2020-12-30 NOTE — ED Notes (Addendum)
Pt clothes soiled. Pt clean and placed in clean gown. Purewick applied. Bed fall alarm in place.

## 2020-12-30 NOTE — ED Triage Notes (Addendum)
Pt to ED via [facility]. Bystander found pt lying in a field. Per EMS pt with hx dementia and daughter states she is known to wonder from home. Pt lives at home alone. Per EMS pt states she was walking outside and it started to rain and she decided to lay down in the field. Pt c/o left hip pain that daughter states is chronic. EMS stating daughter states this is pt's baseline and didn't encode for AMS. Pt noted to have bruising on left hip and knee and skin tear to left lower extremity. Pt in NAD.

## 2020-12-30 NOTE — ED Provider Notes (Signed)
[facility] Emergency Department Provider Note  ____________________________________________  Time seen: Approximately 9:14 AM  I have reviewed the triage vital signs and the nursing notes.   HISTORY  Chief Complaint Fall    Level 5 Caveat: Portions of the History and Physical including HPI and review of systems are unable to be completely obtained due to patient being a poor historian   HPI [name] is a 87 y.o. female with a past history of hypertension, pacemaker insertion, advanced dementia with wandering who reportedly lives alone.  Last night walked out of her home in the middle the night and was found this morning lying down in a nearby field.  Patient reports that she left her home at about 3:30 AM.  It started raining so she decided to lie down instead of walking back to her house.  She denies fall or head injury.  Denies any other symptoms.  She reports chronic left hip pain as well as some left wrist pain and right toe pain.   Past Medical History:  Diagnosis Date   Allergy    Arthritis    CAD (coronary artery disease)    Fatty liver    on u/s 2010   Heart murmur    Hyperlipidemia    Hypertension    Insomnia    Myocardial infarction ([place])    Osteoporosis    dxa 2013   PAF (paroxysmal atrial fibrillation) ([place])    Shingles 11/08/2019   [facility]   SSS (sick sinus syndrome) ([place])    St. Jude Permanent Pacemaker 09/23/06   Symptomatic bradycardia      Patient Active Problem List   Diagnosis Date Noted   Skin tear of right lower leg without complication 97/07/6376   Memory loss 12/11/2015   SSS (sick sinus syndrome) ([place]) 05/17/2015   Hypothyroidism 11/02/2014   PAF- none documented in years, not on anticoagulation 10/27/2014   Fall at home 07/14/2014   Bruit of right carotid artery 01/26/2014   Medicare annual wellness visit, subsequent 12/22/2013   Advance care planning 12/22/2013   Hyperlipidemia 07/29/2013    Fatty liver 06/04/2012   Murmur, cardiac 11/28/2011   Back pain 08/29/2011   Osteoporosis 06/26/2011   Vertigo 03/05/2011   Neck pain 03/05/2011   Rash 11/16/2010   Essential hypertension, benign 11/16/2010   CAD S/P PCI 2008 11/16/2010   Pacemaker - dual chamber St. Jude gen change 2016 11/16/2010   Tinnitus of both ears 11/16/2010     Past Surgical History:  Procedure Laterality Date   BACK SURGERY     CARPAL TUNNEL RELEASE     left   CHOLECYSTECTOMY     CORONARY ANGIOPLASTY WITH STENT PLACEMENT  12/03/06   Stent to distal RCA   CORONARY ANGIOPLASTY WITH STENT PLACEMENT  09/12/06   Stenting mid distal segment & PTCA alone distal RCA prox. to the posterior descending artery takeoff.   CORONARY STENT PLACEMENT     RCA stent replaced    EP IMPLANTABLE DEVICE N/A 10/26/2014   Procedure: PPM Generator Changeout;  Surgeon: Sanda Klein, MD;  Location: [place] [facility];  Service: Cardiovascular;  Laterality: N/A;   EP IMPLANTABLE DEVICE N/A 10/26/2014   Procedure: Pacemaker Revision- new RV Lead;  Surgeon: Sanda Klein, MD;  Location: [place] [facility];  Service: Cardiovascular;  Laterality: N/A;   FOOT SURGERY     Left   HERNIA REPAIR     R inguinal   PACEMAKER INSERTION  09/23/06  St.Jude   US ECHOCARDIOGRAPHY  11/26/11   prox. septal thickening, mild concentric LVH, mod. ca+ AOV,trace AI,mild to mod valvular aortic stenosis.     Prior to Admission medications   Medication Sig Start Date End Date Taking? Authorizing Provider  Acetaminophen (TYLENOL 8 HOUR PO) Take by mouth as needed.    [provider]  aspirin 81 MG tablet Take 81 mg by mouth daily.    [provider]  cholecalciferol (VITAMIN D) 1000 units tablet Take 1,000 Units by mouth daily.    [provider]  cyanocobalamin 1000 MCG tablet Take 1,000 mcg by mouth daily.    [provider]  docusate sodium (COLACE) 100 MG capsule Take 100 mg by mouth daily.    [provider]  fluticasone (FLONASE) 50 MCG/ACT nasal spray Place 2 sprays into both nostrils daily as needed for allergies or rhinitis. 05/14/19   Tonia Ghent, MD  levothyroxine (SYNTHROID) 50 MCG tablet Take 1 tab daily except skip Sundays.  6 pills per week. 11/08/20   Tonia Ghent, MD  losartan (COZAAR) 50 MG tablet TAKE 1 TABLET BY MOUTH  DAILY 10/19/20   Tonia Ghent, MD  naproxen sodium (ALEVE) 220 MG tablet Take by mouth as needed.     [provider]  nitroGLYCERIN (NITROSTAT) 0.4 MG SL tablet Place 1 tablet (0.4 mg total) under the tongue every 5 (five) minutes as needed. 04/09/17   Tonia Ghent, MD  polyethylene glycol (MIRALAX / GLYCOLAX) 17 g packet Take 17 g by mouth daily.    [provider]  traMADol (ULTRAM) 50 MG tablet Take 1 tablet (50 mg total) by mouth 3 (three) times daily as needed. 04/10/18   Tonia Ghent, MD     Allergies Simvastatin and Fosamax [alendronate sodium]   Family History  Problem Relation Age of Onset   Early death Mother        died at age 61, unknown cause   Heart disease Father    Diabetes Father     Social History Social History   Tobacco Use   Smoking status: Never   Smokeless tobacco: Never  Vaping Use   Vaping Use: Never used  Substance Use Topics   Alcohol use: No   Drug use: No    Review of Systems Level 5 Caveat: Portions of the History and Physical including HPI and review of systems are unable to be completely obtained due to patient being a poor historian   Constitutional:   No known fever.  ENT:   No rhinorrhea. Cardiovascular:   No chest pain or syncope. Respiratory:   No dyspnea or cough. Gastrointestinal:   Negative for abdominal pain, vomiting and diarrhea.  Musculoskeletal:   Musculoskeletal complaints as above ____________________________________________   PHYSICAL EXAM:  VITAL SIGNS: ED Triage Vitals [12/30/20 0818]  Enc Vitals Group     BP (!) 145/102     Pulse Rate 79     Resp  18     Temp 98.3 F (36.8 C)     Temp Source Oral     SpO2 94 %     Weight 143 lb (64.9 kg)     Height 5' (1.524 m)     Head Circumference      Peak Flow      Pain Score      Pain Loc      Pain Edu?      Excl. in [place]?     Vital  signs reviewed, nursing assessments reviewed.   Constitutional:   Awake and alert.  Not oriented. Non-toxic appearance. Eyes:   Conjunctivae are normal. EOMI. PERRL. ENT      Head:   Normocephalic and atraumatic.      Nose:   No congestion/rhinnorhea.       Mouth/Throat:   MMM, no pharyngeal erythema. No peritonsillar mass.       Neck:   No meningismus. Full ROM. Hematological/Lymphatic/Immunilogical:   No cervical lymphadenopathy. Cardiovascular:   RRR. Symmetric bilateral radial and DP pulses.  No murmurs. Cap refill less than 2 seconds. Respiratory:   Normal respiratory effort without tachypnea/retractions. Breath sounds are clear and equal bilaterally. No wheezes/rales/rhonchi. Gastrointestinal:   Soft and nontender. Non distended. There is no CVA tenderness.  No rebound, rigidity, or guarding. Genitourinary:   deferred Musculoskeletal:   Normal range of motion in all extremities. No joint effusions.  There is bruising and tenderness of the third toe on the right foot.  Mild tenderness at the left wrist, no snuffbox tenderness..  No edema. Neurologic:   Normal speech and language.  Motor grossly intact. No acute focal neurologic deficits are appreciated.  Skin:    Skin is warm, dry with a small skin tear on the left shin, hemostatic.Marland Kitchen No rash noted.  No petechiae, purpura, or bullae.  ____________________________________________    LABS (pertinent positives/negatives) (all labs ordered are listed, but only abnormal results are displayed) Labs Reviewed  CBC - Abnormal; Notable for the following components:      Result Value   WBC 19.4 (*)    Platelets 146 (*)    All other components within normal limits  BASIC METABOLIC PANEL - Abnormal;  Notable for the following components:   Sodium 134 (*)    CO2 20 (*)    Glucose, Bld 121 (*)    Creatinine, Ser 1.13 (*)    GFR, Estimated 46 (*)    All other components within normal limits  URINALYSIS, COMPLETE (UACMP) WITH MICROSCOPIC - Abnormal; Notable for the following components:   Color, Urine YELLOW (*)    APPearance HAZY (*)    Ketones, ur 5 (*)    Leukocytes,Ua TRACE (*)    All other components within normal limits  TROPONIN I (HIGH SENSITIVITY) - Abnormal; Notable for the following components:   Troponin I (High Sensitivity) 29 (*)    All other components within normal limits  TROPONIN I (HIGH SENSITIVITY)   ____________________________________________   EKG    ____________________________________________    RADIOLOGY  DG Wrist Complete Left  Result Date: 12/30/2020 CLINICAL DATA:  Left wrist pain EXAM: LEFT WRIST - COMPLETE 3+ VIEW COMPARISON:  None. FINDINGS: No acute fracture or dislocation. Moderate osteoarthritis, most pronounced at the first  [facility] and triscaphe joints. Chondrocalcinosis is present. Bones are demineralized. No focal erosion. No soft tissue swelling. IMPRESSION: 1. No acute osseous abnormality. 2. Moderate osteoarthritis of the left hand and wrist. Electronically Signed   By: Davina Poke D.O.   On: 12/30/2020 09:11   DG Foot 2 Views Right  Result Date: 12/30/2020 CLINICAL DATA:  Left foot pain and bruising EXAM: RIGHT FOOT - 2 VIEW COMPARISON:  None. FINDINGS: Tiny bony fragment along the lateral base of the first proximal phalanx which may reflect sequela prior injury, but if there is clinical concern regarding injury of the great toe recommend a focus three-view x-ray of the right great toe. No aggressive osseous lesion. Normal alignment. Enthesopathic changes at the Achilles tendon insertion. Soft  tissue are unremarkable. No radiopaque foreign body or soft tissue emphysema. Peripheral vascular atherosclerotic disease. IMPRESSION: Tiny bony  fragment along the lateral base of the first proximal phalanx which may reflect sequela prior injury, but if there is clinical concern regarding injury of the great toe recommend a focus three-view x-ray of the right great toe. Otherwise, no acute osseous injury of the right foot. Electronically Signed   By: Kathreen Devoid   On: 12/30/2020 09:12   DG Hip Unilat With Pelvis 2-3 Views Left  Result Date: 12/30/2020 CLINICAL DATA:  Status post fall EXAM: DG HIP (WITH OR WITHOUT PELVIS) 2-3V LEFT COMPARISON:  None. FINDINGS: Nondisplaced fracture of the left inferior pubic ramus. No aggressive osseous lesion. Normal alignment. Generalized osteopenia. Soft tissue are unremarkable. No radiopaque foreign body or soft tissue emphysema. Peripheral vascular atherosclerotic disease. IMPRESSION: Nondisplaced fracture of the left inferior pubic ramus. No acute osseous injury of the left hip. Given the patient's age and osteopenia, if there is persistent clinical concern for an occult hip fracture, a MRI of the hip is recommended for increased sensitivity. Electronically Signed   By: Kathreen Devoid   On: 12/30/2020 09:10    ____________________________________________   PROCEDURES Procedures  ____________________________________________  DIFFERENTIAL DIAGNOSIS   UTI, dehydration, electrolyte abnormality, fracture, dementia  CLINICAL IMPRESSION / ASSESSMENT AND PLAN / ED COURSE  Medications ordered in the ED: Medications  Tdap (BOOSTRIX) injection 0.5 mL (0.5 mLs Intramuscular Given 12/30/20 0942)    Pertinent labs & imaging results that were available during my care of the patient were reviewed by me and considered in my medical decision making (see chart for details).   [name] was evaluated in Emergency Department on 12/30/2020 for the symptoms described in the history of present illness. She was evaluated in the context of the global COVID-19 pandemic, which necessitated consideration that the patient  might be at risk for infection with the SARS-CoV-2 virus that causes COVID-19. Institutional protocols and algorithms that pertain to the evaluation of patients at risk for COVID-19 are in a state of rapid change based on information released by regulatory bodies including the CDC and federal and state organizations. These policies and algorithms were followed during the patient's care in the ED.   Patient brought to ED after wandering from home and being found in the field.  Exam suggestive of some fall and minor trauma though patient denies falling.  Will obtain x-rays and labs.  ----------------------------------------- 12:13 PM on 12/30/2020 ----------------------------------------- X-rays and labs unremarkable.  Findings discussed with daughter at bedside who is comfortable taking the patient home.  Daughter lives next door to the patient and helps take care of her and prepare her meals.  She is stable for discharge.      ____________________________________________   FINAL CLINICAL IMPRESSION(S) / ED DIAGNOSES    Final diagnoses:  Chronic dementia without behavioral disturbance (HCC)  Skin tear of left lower leg without complication, initial encounter  Bruised toe     ED Discharge Orders     None       Portions of this note were generated with dragon dictation software. Dictation errors may occur despite best attempts at proofreading.   Carrie Mew, MD 12/30/20 1214

## 2020-12-30 NOTE — ED Notes (Signed)
Discharge instructions reviewed with pt and daughter.

## 2020-12-30 NOTE — ED Notes (Signed)
ED Provider at bedside. 

## 2020-12-30 NOTE — ED Notes (Signed)
Daughter is at bedside. MD made aware

## 2021-01-01 ENCOUNTER — Telehealth: Payer: Self-pay | Admitting: Family Medicine

## 2021-01-01 NOTE — Telephone Encounter (Signed)
Please call family about scheduling ER/dementia follow-up.  Thanks.

## 2021-01-02 NOTE — Telephone Encounter (Signed)
Family has called for appointment. No further action needed.

## 2021-01-03 ENCOUNTER — Other Ambulatory Visit: Payer: Self-pay | Admitting: *Deleted

## 2021-01-03 DIAGNOSIS — R413 Other amnesia: Secondary | ICD-10-CM

## 2021-01-04 ENCOUNTER — Telehealth: Payer: Self-pay | Admitting: *Deleted

## 2021-01-04 NOTE — Telephone Encounter (Signed)
   Telephone encounter was:  Successful.  01/04/2021 Name: VENISSA NAPPI MRN: [id number] DOB: Aug 29, 1927  ANAILY ASHBAUGH is a 87 y.o. year old female who is a primary care patient of Tonia Ghent, MD . The community resource team was consulted for assistance with Daughter was interested in lots of information so  I spent an hour discusssing her concerns and provided as much information as possible . moms meals , transportation , in home, pace , adult care home  Care guide performed the following interventions: Patient provided with information about care guide support team and interviewed to confirm resource needs.  Follow Up Plan:  No further follow up planned at this time. The patient has been provided with needed resources.  [place], Care Management  [phone] [address] , [place] [address] Email : Ashby Dawes. [email or web address]

## 2021-01-06 ENCOUNTER — Other Ambulatory Visit: Payer: Self-pay

## 2021-01-06 ENCOUNTER — Encounter: Payer: Self-pay | Admitting: Family Medicine

## 2021-01-06 ENCOUNTER — Ambulatory Visit (INDEPENDENT_AMBULATORY_CARE_PROVIDER_SITE_OTHER): Payer: Medicare Other | Admitting: Family Medicine

## 2021-01-06 ENCOUNTER — Telehealth: Payer: Self-pay | Admitting: *Deleted

## 2021-01-06 ENCOUNTER — Telehealth: Payer: Self-pay

## 2021-01-06 VITALS — BP 162/60 | HR 67 | Temp 98.6°F | Ht 60.0 in | Wt 124.6 lb

## 2021-01-06 DIAGNOSIS — S32592D Other specified fracture of left pubis, subsequent encounter for fracture with routine healing: Secondary | ICD-10-CM

## 2021-01-06 DIAGNOSIS — S32599D Other specified fracture of unspecified pubis, subsequent encounter for fracture with routine healing: Secondary | ICD-10-CM

## 2021-01-06 DIAGNOSIS — R413 Other amnesia: Secondary | ICD-10-CM | POA: Diagnosis not present

## 2021-01-06 DIAGNOSIS — E039 Hypothyroidism, unspecified: Secondary | ICD-10-CM | POA: Diagnosis not present

## 2021-01-06 DIAGNOSIS — M899 Disorder of bone, unspecified: Secondary | ICD-10-CM | POA: Diagnosis not present

## 2021-01-06 LAB — BASIC METABOLIC PANEL
BUN: 19 mg/dL (ref 6–23)
CO2: 23 mEq/L (ref 19–32)
Calcium: 9.7 mg/dL (ref 8.4–10.5)
Chloride: 105 mEq/L (ref 96–112)
Creatinine, Ser: 1.01 mg/dL (ref 0.40–1.20)
GFR: 48.68 mL/min — ABNORMAL LOW (ref >60.00)
Glucose, Bld: 79 mg/dL (ref 70–99)
Potassium: 4.3 mEq/L (ref 3.5–5.1)
Sodium: 138 mEq/L (ref 135–145)

## 2021-01-06 LAB — CBC WITH DIFFERENTIAL/PLATELET
Basophils Absolute: 0.1 10*3/uL (ref 0.0–0.1)
Basophils Relative: 0.6 % (ref 0.0–3.0)
Eosinophils Absolute: 0.1 10*3/uL (ref 0.0–0.7)
Eosinophils Relative: 1.3 % (ref 0.0–5.0)
HCT: 38.4 % (ref 36.0–46.0)
Hemoglobin: 12.8 g/dL (ref 12.0–15.0)
Lymphocytes Relative: 21.1 % (ref 12.0–46.0)
Lymphs Abs: 2.3 10*3/uL (ref 0.7–4.0)
MCHC: 33.3 g/dL (ref 30.0–36.0)
MCV: 88.9 fl (ref 78.0–100.0)
Monocytes Absolute: 1.2 10*3/uL — ABNORMAL HIGH (ref 0.1–1.0)
Monocytes Relative: 11.5 % (ref 3.0–12.0)
Neutro Abs: 7 10*3/uL (ref 1.4–7.7)
Neutrophils Relative %: 65.5 % (ref 43.0–77.0)
Platelets: 186 10*3/uL (ref 150.0–400.0)
RBC: 4.32 Mil/uL (ref 3.87–5.11)
RDW: 14.3 % (ref 11.5–15.5)
WBC: 10.7 10*3/uL — ABNORMAL HIGH (ref 4.0–10.5)

## 2021-01-06 LAB — VITAMIN D 25 HYDROXY (VIT D DEFICIENCY, FRACTURES): VITD: 35.38 ng/mL (ref 30.00–100.00)

## 2021-01-06 LAB — TSH: TSH: 4.81 u[IU]/mL (ref 0.35–5.50)

## 2021-01-06 MED ORDER — IBUPROFEN 200 MG PO TABS: 200.0000 mg | ORAL_TABLET | Freq: Three times a day (TID) | ORAL | Status: AC | PRN

## 2021-01-06 NOTE — Progress Notes (Signed)
This visit occurred during the SARS-CoV-2 public health emergency.  Safety protocols were in place, including screening questions prior to the visit, additional usage of staff PPE, and extensive cleaning of exam room while observing appropriate contact time as indicated for disinfecting solutions.  Follow-up regarding memory loss and ER evaluation.  She wandered out of the house at night, found the next AM a few houses down, in a field.  EMS called, taken to ER.  ER course d/w pt. She doesn't remember any of that.    L pelvic ramus rx, imaging d/w pt.  Using walker now.  Taking tylenol for pain w/o much relief.  We talked about pain med options, nsaids vs tramadol.  She is walking better now than prev, using a walker.  We talked about placement vs staying at home.  Her daughter is strained caring for patient, 24/7 in the meantime.    Meds, vitals, and allergies reviewed.   ROS: Per HPI unless specifically indicated in ROS section   GEN: nad, alert and pleasant in conversation, oriented to self but does not recall anything about going to the emergency room HEENT: ncat NECK: supple w/o LA CV: rrr. PULM: ctab, no inc wob ABD: soft, +bs EXT: no edema SKIN: Well-perfused. Able to bear weight but limp with gait as expected given ramus fracture.  Walking with walker.

## 2021-01-06 NOTE — Telephone Encounter (Signed)
I wouldn't inc melatonin.  I would try treating the pain as planned and see if that helps.  Thanks.

## 2021-01-06 NOTE — Patient Instructions (Signed)
Take ibuprofen 200 mg 3 times a day with food if tylenol doesn't help.  Go to the lab on the way out.   If you have mychart we'll likely use that to update you.    Take care.  Glad to see you. Let me check with social work again in the meantime.

## 2021-01-06 NOTE — Telephone Encounter (Signed)
Patient's daughter, [name], called back stating she forgot to ask you a question. Wanted to know if patient should continue taking Melatonin 6 mg or less or go up on the dose or not at all now? At one point per [facility] patient was advised to not go over 6 mg. [name] states that 6 mg is working but not as well as it should in her opinion, feels like it should work better. She is not sleeping as comfortable due to recent fall and hurting so it could be a combination of things that affects her sleep.  Or should she try another sleep aid?

## 2021-01-06 NOTE — Telephone Encounter (Signed)
   Telephone encounter was:  Unsuccessful.  01/06/2021 Name: [name] MRN: [id number] DOB: 02-03-1928  Unsuccessful outbound call made today to assist with:  Transportation Needs , Food Insecurity, Home Modifications, and Caregiver Stress  Outreach Attempt:  2nd Attempt  A HIPAA compliant voice message was left requesting a return call.  Instructed patient to call back at   Instructed patient to call back at [phone]  at their earliest convenience.   [place], Care Management  [phone] [address] , [place] [address] Email : [email or web address] [email or web address]. [email or web address]

## 2021-01-06 NOTE — Telephone Encounter (Signed)
Spoke tp Pt's dtr, [name], and relayed Dr. Josefine Class message. [name] stated understanding.

## 2021-01-08 DIAGNOSIS — S32599A Other specified fracture of unspecified pubis, initial encounter for closed fracture: Secondary | ICD-10-CM | POA: Insufficient documentation

## 2021-01-08 NOTE — Assessment & Plan Note (Signed)
Progressive.  Does not remember ER evaluation or wandering out of the house.  Safety discussed with daughter.  Needs increased support/monitoring.  I will check with social work again.  I am uncertain if pubic ramus fracture will qualify for admission to SNF.    We talked about memory loss.  In her particular case primary memory loss is dementia.  She does not have behavioral disturbances meaning aggression or psychotic features.  She does not have obvious parkinsonian features or obvious signs of frontotemporal dementia.  With progressive memory loss I presume her to have primary dementia, potentially Alzheimer's type.

## 2021-01-08 NOTE — Assessment & Plan Note (Addendum)
Continue Tylenol as needed.  Can try ibuprofen 200 mg with food 3 times a day.  Discontinue tramadol.  Routine cautions and discussion regarding NSAIDs done with patient and daughter.  Use walker, weightbearing as tolerated.  Check routine labs today.

## 2021-01-09 ENCOUNTER — Telehealth: Payer: Self-pay | Admitting: *Deleted

## 2021-01-09 NOTE — Telephone Encounter (Signed)
   Telephone encounter was:  Successful.  01/09/2021 Name: [name] MRN: [id number] DOB: 11/10/1927  [name] is a 87 y.o. year old female who is a primary care patient of Tonia Ghent, MD . The community resource team was consulted for assistance with [place] guide performed the following interventions: Patient provided with information about care guide support team and interviewed to confirm resource needs.  Follow Up Plan:  No further follow up planned at this time. The patient has been provided with needed resources. [place], Care Management  [phone] [address] , [place] [address] Email : Ashby Dawes. [email or web address]

## 2021-01-10 ENCOUNTER — Ambulatory Visit (INDEPENDENT_AMBULATORY_CARE_PROVIDER_SITE_OTHER): Payer: Medicare Other | Admitting: *Deleted

## 2021-01-10 DIAGNOSIS — I10 Essential (primary) hypertension: Secondary | ICD-10-CM | POA: Diagnosis not present

## 2021-01-10 DIAGNOSIS — Z950 Presence of cardiac pacemaker: Secondary | ICD-10-CM

## 2021-01-10 DIAGNOSIS — M810 Age-related osteoporosis without current pathological fracture: Secondary | ICD-10-CM | POA: Diagnosis not present

## 2021-01-10 DIAGNOSIS — R413 Other amnesia: Secondary | ICD-10-CM

## 2021-01-10 DIAGNOSIS — S32592D Other specified fracture of left pubis, subsequent encounter for fracture with routine healing: Secondary | ICD-10-CM

## 2021-01-10 NOTE — Chronic Care Management (AMB) (Signed)
Chronic Care Management    Clinical Social Work Note  01/10/2021 Name: [name] MRN: [id number] DOB: 1927-07-09  [name] is a 87 y.o. year old female who is a primary care patient of Tonia Ghent, MD. The CCM team was consulted to assist the patient with chronic disease management and/or care coordination needs related to: [employer] , Level of Care Concerns, and Caregiver Stress.   Engaged with patient by telephone for follow up visit in response to provider referral for social work chronic care management and care coordination services.   Consent to Services:  The patient was given information about Chronic Care Management services, agreed to services, and gave verbal consent prior to initiation of services.  Please see initial visit note for detailed documentation.   Patient agreed to services and consent obtained.   Assessment: Review of patient past medical history, allergies, medications, and health status, including review of relevant consultants reports was performed today as part of a comprehensive evaluation and provision of chronic care management and care coordination services.     SDOH (Social Determinants of Health) assessments and interventions performed:    Advanced Directives Status: Not addressed in this encounter.  CCM Care Plan  Allergies  Allergen Reactions   Simvastatin     Presumed cause of LFT elevation.   Fosamax [Alendronate Sodium] Other (See Comments)    Anxious and "I just didn't feel right" after each dose; 2.5 month trial    Outpatient Encounter Medications as of 01/10/2021  Medication Sig   Acetaminophen (TYLENOL 8 HOUR PO) Take by mouth as needed.   aspirin 81 MG tablet Take 81 mg by mouth daily.   cholecalciferol (VITAMIN D) 1000 units tablet Take 1,000 Units by mouth daily.   cyanocobalamin 1000 MCG tablet Take 1,000 mcg by mouth daily.   docusate sodium (COLACE) 100 MG capsule Take 100 mg by mouth daily.   fluticasone  (FLONASE) 50 MCG/ACT nasal spray Place 2 sprays into both nostrils daily as needed for allergies or rhinitis.   ibuprofen (ADVIL) 200 MG tablet Take 1 tablet (200 mg total) by mouth every 8 (eight) hours as needed (with food).   levothyroxine (SYNTHROID) 50 MCG tablet Take 1 tab daily except skip Sundays.  6 pills per week.   losartan (COZAAR) 50 MG tablet TAKE 1 TABLET BY MOUTH  DAILY   nitroGLYCERIN (NITROSTAT) 0.4 MG SL tablet Place 1 tablet (0.4 mg total) under the tongue every 5 (five) minutes as needed.   polyethylene glycol (MIRALAX / GLYCOLAX) 17 g packet Take 17 g by mouth daily.   No facility-administered encounter medications on file as of 01/10/2021.    Patient Active Problem List   Diagnosis Date Noted   Pubic ramus fracture ([place]) 01/08/2021   Skin tear of right lower leg without complication 05/39/7673   Memory loss 12/11/2015   SSS (sick sinus syndrome) ([place]) 05/17/2015   Hypothyroidism 11/02/2014   PAF- none documented in years, not on anticoagulation 10/27/2014   Fall at home 07/14/2014   Bruit of right carotid artery 01/26/2014   Medicare annual wellness visit, subsequent 12/22/2013   Advance care planning 12/22/2013   Hyperlipidemia 07/29/2013   Fatty liver 06/04/2012   Murmur, cardiac 11/28/2011   Back pain 08/29/2011   Osteoporosis 06/26/2011   Vertigo 03/05/2011   Neck pain 03/05/2011   Rash 11/16/2010   Essential hypertension, benign 11/16/2010   CAD S/P PCI 2008 11/16/2010   Pacemaker - dual chamber [employer] gen change  2016 11/16/2010   Tinnitus of both ears 11/16/2010    Conditions to be addressed/monitored: Dementia; Level of care concerns, Cognitive Deficits, and Lacks knowledge of community resources  Care Plan : Develop patient-centered plan of care to promote and enhance safety and overall quality of life  Updates made by Deirdre Peer, LCSW since 01/10/2021 12:00 AM     Problem: Caregiver Stress      Long-Range Goal: Caregiver Coping  Optimized   Start Date: 10/04/2020  Expected End Date: 03/24/2021  This Visit's Progress: On track  Recent Progress: On track  Priority: High  Note:   Current barriers:   Patient in need of assistance with connecting to community resources for Level of care concerns, Social Isolation, Cognitive Deficits, Lacks knowledge of community resource: long term planning/placement, and caregiver support Acknowledges deficits with meeting this unmet need Patient is unable to independently navigate community resource options without care coordination support Clinical Goals: Patient/family will work with LCSW to address needs related to pt's long term needs/planning  Clinical Interventions:  CSW spoke with pt's daughter/HCPOA, [name], by phone who reports pt was in the ER after wandering away from home and falling.  Per daughter, she is seeking help for pt at home; through private duty care arrangements but is struggling to find people.  She has 2 people that she is awaiting word from that may be able to help.   Daughter is frustrated and tired; caregiving for her mother has become overwhelming and she is seeking help/options.  CSW had a lengthy conversation with daughter about the barriers; finances (needs to apply for Medicaid to determine her patient liabilty/out of pocket) as well as to determine options for planning long term; placement, etc.  Daughter is primary caregiver; living next door on the family property and overseeing her mother's needs. CSW had a lengthy conversation with daughter about options for her mother. At this time, she understands the levels of care, costs/coverage and avenues to take.  At this time, she does not feel her mother needs to be moved into a facility setting and wants to further pursue caregivers in the home (private pay).   CSW encouraged pt's daughter to feel empowered to go to the extended family and ask for help; as well as to continue to seek options for people who may  "sit"with pt.   CSW stressed the importance of a healthy balance and for self care to which daughter appreciated yet minimized the need or ability to do.  Will follow up on this with her again as well.  Collaboration with Tonia Ghent, MD regarding development and update of comprehensive plan of care as evidenced by provider attestation and co-signature Inter-disciplinary care team collaboration (see longitudinal plan of care) Assessment of needs, barriers , agencies contacted, as well as how impacting  Review various resources, discussed options and provided daughter information about Department of Social Services (Medicaid, PCS),Insurance provider benefits, Elder Training and development officer, ALF/Memory Care and LTC/SNF options, Adult Day Care, Private duty care, Geophysicist/field seismologist and respite care/caregiver support suggestions Patient's daughter was interviewed and appropriate assessments performed Referred patient to community resources care guide team for assistance with community based resources for senior center/adult day care options Provided patient's  with information about levels of care, in home care/private pay, Medicaid, Elder Law assistance, community resources   Discussed plans with patient's daughter  for ongoing care management follow up and provided patient with direct contact information for care management team Advised patient's daughter to  seek legal guidance as appropriate regarding property/assets and potential Medicaid  A voluntary and extensive discussion about advanced care planning including explanation and discussion of advanced was undertaken with the patient's daughter.   Other interventions provided:Solution-Focused Strategies Active listening / Reflection  Emotional Supportive Provided Problem Solving /Task Center  Psychoeducation and/or Health Education Brief CBT Grief Counseling Teaching/Coaching Strategies  Motivational Interviewing Patient Goals/Self-Care  Activities:   -seek private duty caregivers for in-home support -contact and visit area Psychiatric nurse discussed for possible day program/activities to consider -speak with Attorney previously used re: property and/or seek Elder Training and development officer guidance re: finances, Medicaid eligibility, etc - begin a notebook of services in my neighborhood or community - think ahead to make sure my need does not become an emergency/ have a back-up plan - make a list of family or friends that I can call and consider talking with them about a vacation/date for a respite time for you -consider/seek private pay caregiver options for times you may need to get away; for a few hours/days/week, etc -practice good "self care" as a caregiver and prevent burnout!    Follow Up Plan: Appointment scheduled for SW follow up with client by phone on:  01/16/2021      Follow Up Plan: Appointment scheduled for SW follow up with client by phone on: 01/16/21      Eduard Clos MSW, [place] Licensed Clinical Social Worker [place] [phone]

## 2021-01-10 NOTE — Patient Instructions (Signed)
Visit Information  PATIENT GOALS:  Goals Addressed             This Visit's Progress    Find Help in My Community       Timeframe:  Long-Range Goal Priority:  High Start Date:    10/04/2020                         Expected End Date:      03/24/2021                 Follow Up Date 01/16/2021  -seek private duty caregivers for in-home support -contact and visit area Psychiatric nurse discussed for possible day program/activities to consider -speak with Attorney previously used re: property and/or seek Elder Training and development officer guidance re: finances, Medicaid eligibility, etc - begin a notebook of services in my neighborhood or community - think ahead to make sure my need does not become an emergency/ have a back-up plan - make a list of family or friends that I can call and consider talking with them about a vacation/date for a respite time for you -consider/seek private pay caregiver options for times you may need to get away; for a few hours/days/week, etc -practice good "self care" as a caregiver and prevent burnout!    Why is this important?   Knowing how and where to find help for yourself or family in your neighborhood and community is an important skill.  You will want to take some steps to learn how.    Notes:         The patient verbalized understanding of instructions, educational materials, and care plan provided today and declined offer to receive copy of patient instructions, educational materials, and care plan.   Telephone follow up appointment with care management team member scheduled for:01/16/21  Eduard Clos MSW, [place] Licensed Clinical Social Worker [place] [phone]

## 2021-01-16 ENCOUNTER — Ambulatory Visit: Payer: Medicare Other | Admitting: *Deleted

## 2021-01-16 DIAGNOSIS — S32592D Other specified fracture of left pubis, subsequent encounter for fracture with routine healing: Secondary | ICD-10-CM

## 2021-01-16 DIAGNOSIS — I10 Essential (primary) hypertension: Secondary | ICD-10-CM

## 2021-01-16 DIAGNOSIS — R413 Other amnesia: Secondary | ICD-10-CM

## 2021-01-16 NOTE — Chronic Care Management (AMB) (Signed)
Chronic Care Management    Clinical Social Work Note  01/16/2021 Name: [name] MRN: [id number] DOB: [birth year]  [name] is a 87 y.o. year old female who is a primary care patient of Tonia Ghent, MD. The CCM team was consulted to assist the patient with chronic disease management and/or care coordination needs related to: Level of Care Concerns and Caregiver Stress.   Engaged with patient by telephone for follow up visit in response to provider referral for social work chronic care management and care coordination services.   Consent to Services:  The patient was given information about Chronic Care Management services, agreed to services, and gave verbal consent prior to initiation of services.  Please see initial visit note for detailed documentation.   Patient agreed to services and consent obtained.   Assessment: Review of patient past medical history, allergies, medications, and health status, including review of relevant consultants reports was performed today as part of a comprehensive evaluation and provision of chronic care management and care coordination services.     SDOH (Social Determinants of Health) assessments and interventions performed:    Advanced Directives Status: Not addressed in this encounter.  CCM Care Plan  Allergies  Allergen Reactions   Simvastatin     Presumed cause of LFT elevation.   Fosamax [Alendronate Sodium] Other (See Comments)    Anxious and "I just didn't feel right" after each dose; 2.5 month trial    Outpatient Encounter Medications as of 01/16/2021  Medication Sig   Acetaminophen (TYLENOL 8 HOUR PO) Take by mouth as needed.   aspirin 81 MG tablet Take 81 mg by mouth daily.   cholecalciferol (VITAMIN D) 1000 units tablet Take 1,000 Units by mouth daily.   cyanocobalamin 1000 MCG tablet Take 1,000 mcg by mouth daily.   docusate sodium (COLACE) 100 MG capsule Take 100 mg by mouth daily.   fluticasone (FLONASE) 50 MCG/ACT  nasal spray Place 2 sprays into both nostrils daily as needed for allergies or rhinitis.   ibuprofen (ADVIL) 200 MG tablet Take 1 tablet (200 mg total) by mouth every 8 (eight) hours as needed (with food).   levothyroxine (SYNTHROID) 50 MCG tablet Take 1 tab daily except skip Sundays.  6 pills per week.   losartan (COZAAR) 50 MG tablet TAKE 1 TABLET BY MOUTH  DAILY   nitroGLYCERIN (NITROSTAT) 0.4 MG SL tablet Place 1 tablet (0.4 mg total) under the tongue every 5 (five) minutes as needed.   polyethylene glycol (MIRALAX / GLYCOLAX) 17 g packet Take 17 g by mouth daily.   No facility-administered encounter medications on file as of 01/16/2021.    Patient Active Problem List   Diagnosis Date Noted   Pubic ramus fracture ([place]) 01/08/2021   Skin tear of right lower leg without complication 99991111   Memory loss 12/11/2015   SSS (sick sinus syndrome) ([place]) 05/17/2015   Hypothyroidism 11/02/2014   PAF- none documented in years, not on anticoagulation 10/27/2014   Fall at home 07/14/2014   Bruit of right carotid artery 01/26/2014   Medicare annual wellness visit, subsequent 12/22/2013   Advance care planning 12/22/2013   Hyperlipidemia 07/29/2013   Fatty liver 06/04/2012   Murmur, cardiac 11/28/2011   Back pain 08/29/2011   Osteoporosis 06/26/2011   Vertigo 03/05/2011   Neck pain 03/05/2011   Rash 11/16/2010   Essential hypertension, benign 11/16/2010   CAD S/P PCI 2008 11/16/2010   Pacemaker - dual chamber [employer] gen change 2016 11/16/2010  Tinnitus of both ears 11/16/2010    Conditions to be addressed/monitored: Dementia; Level of care concerns  Care Plan : Develop patient-centered plan of care to promote and enhance safety and overall quality of life  Updates made by Deirdre Peer, LCSW since 01/16/2021 12:00 AM     Problem: Caregiver Stress      Long-Range Goal: Caregiver Coping Optimized Completed 01/16/2021  Start Date: 10/04/2020  Expected End Date: 03/24/2021   This Visit's Progress: On track  Recent Progress: On track  Priority: High  Note:   Current barriers:   Patient in need of assistance with connecting to community resources for Level of care concerns, Social Isolation, Cognitive Deficits, Lacks knowledge of community resource: long term planning/placement, and caregiver support Acknowledges deficits with meeting this unmet need Patient is unable to independently navigate community resource options without care coordination support Clinical Goals: Patient/family will work with LCSW to address needs related to pt's long term needs/planning  Clinical Interventions:  CSW spoke with pt's daughter/HCPOA, [name], by phone who reports she has found someone to do private duty care; "she is on her third day of helping".  Daughter remains a bit worried and skeptical that the person will continue to come and CSW has suggested she work to find additional help who can be a back up and/or both backup and additional hours as desired. CSW stressed to daughter again that because of pt's financial status and the need to apply for Medicaid to determine what sort of penalty/out of pocket expense she is looking at with them, we are out of options.  Daughter voiced frustration with our (SW) inability to "fix things".  " I had hoped for more..." CSW again reminded her that in order to pursue further options; whether it be in-home care or memory care ALF, she has to have a payor source.   Daughter is hopeful to try to find additional help/caregivers as well as to assess the ability to leave pt alone periodically; for short periods of time.  pt was in the ER after wandering away from home and falling.  Per daughter, she is seeking help for pt at home; through private duty care arrangements but is struggling to find people.  She has 2 people that she is awaiting word from that may be able to help.  Daughter is frustrated and tired; caregiving for her mother has become overwhelming  and she is seeking help/options.  CSW had a lengthy conversation with daughter about the barriers; finances (needs to apply for Medicaid to determine her patient liabilty/out of pocket) as well as to determine options for planning long term; placement, etc.  Daughter is primary caregiver; living next door on the family property and overseeing her mother's needs. CSW had a lengthy conversation with daughter about options for her mother. At this time, she understands the levels of care, costs/coverage and avenues to take.  At this time, she does not feel her mother needs to be moved into a facility setting and wants to further pursue caregivers in the home (private pay).   CSW encouraged pt's daughter to feel empowered to go to the extended family and ask for help; as well as to continue to seek options for people who may "sit"with pt.   CSW stressed the importance of a healthy balance and for self care to which daughter appreciated yet minimized the need or ability to do.  Will follow up on this with her again as well.  Collaboration with Elsie Stain  S, MD regarding development and update of comprehensive plan of care as evidenced by provider attestation and co-signature Inter-disciplinary care team collaboration (see longitudinal plan of care) Assessment of needs, barriers , agencies contacted, as well as how impacting  Review various resources, discussed options and provided daughter information about Department of Social Services (Medicaid, PCS),Insurance provider benefits, Elder Training and development officer, ALF/Memory Care and LTC/SNF options, Adult Day Care, Private duty care, Geophysicist/field seismologist and respite care/caregiver support suggestions Patient's daughter was interviewed and appropriate assessments performed Referred patient to community resources care guide team for assistance with community based resources for senior center/adult day care options Provided patient's  with information about levels of  care, in home care/private pay, Medicaid, Elder Secretary/administrator, community resources   Discussed plans with patient's daughter  for ongoing care management follow up and provided patient with direct contact information for care management team Advised patient's daughter to seek legal guidance as appropriate regarding property/assets and potential Medicaid  A voluntary and extensive discussion about advanced care planning including explanation and discussion of advanced was undertaken with the patient's daughter.   Other interventions provided:Solution-Focused Strategies Active listening / Reflection  Emotional Supportive Provided Problem Solving /Task Center  Psychoeducation and/or Health Education Brief CBT Grief Counseling Teaching/Coaching Strategies  Motivational Interviewing Patient Goals/Self-Care Activities:   -seek private duty caregivers for in-home support -contact and visit area Psychiatric nurse discussed for possible day program/activities to consider -speak with Attorney previously used re: property and/or seek Elder Training and development officer guidance re: finances, Medicaid eligibility, etc - begin a notebook of services in my neighborhood or community - think ahead to make sure my need does not become an emergency/ have a back-up plan - make a list of family or friends that I can call and consider talking with them about a vacation/date for a respite time for you -consider/seek private pay caregiver options for times you may need to get away; for a few hours/days/week, etc -practice good "self care" as a caregiver and prevent burnout!    Follow Up Plan: Client will contact CSW if further needs arise.       Follow Up Plan: Client will contact CSW If further needs/interest arise Eduard Clos MSW, LCSW Licensed Clinical Social Worker [place] [phone]

## 2021-01-16 NOTE — Patient Instructions (Signed)
Visit Information  PATIENT GOALS:  Goals Addressed             This Visit's Progress    COMPLETED: Find Help in My Community       Timeframe:  Long-Range Goal Priority:  High Start Date:    10/04/2020                         Expected End Date:      03/24/2021                 Follow Up Date 01/16/2021  -seek private duty caregivers for in-home support -contact and visit area Psychiatric nurse discussed for possible day program/activities to consider -speak with Attorney previously used re: property and/or seek Elder Training and development officer guidance re: finances, Medicaid eligibility, etc - begin a notebook of services in my neighborhood or community - think ahead to make sure my need does not become an emergency/ have a back-up plan - make a list of family or friends that I can call and consider talking with them about a vacation/date for a respite time for you -consider/seek private pay caregiver options for times you may need to get away; for a few hours/days/week, etc -practice good "self care" as a caregiver and prevent burnout!    Why is this important?   Knowing how and where to find help for yourself or family in your neighborhood and community is an important skill.  You will want to take some steps to learn how.    Notes:         The patient verbalized understanding of instructions, educational materials, and care plan provided today and declined offer to receive copy of patient instructions, educational materials, and care plan.   The patient has been provided with contact information for the care management team and has been advised to call with any health related questions or concerns.  No further follow up required: please re-consult if needs arise  Eduard Clos MSW, LCSW Licensed Clinical Social Worker [place] [phone]

## 2021-01-19 ENCOUNTER — Other Ambulatory Visit: Payer: Medicare Other

## 2021-02-28 ENCOUNTER — Ambulatory Visit (INDEPENDENT_AMBULATORY_CARE_PROVIDER_SITE_OTHER): Payer: Medicare Other

## 2021-02-28 DIAGNOSIS — I495 Sick sinus syndrome: Secondary | ICD-10-CM | POA: Diagnosis not present

## 2021-02-28 LAB — CUP PACEART REMOTE DEVICE CHECK
Battery Remaining Longevity: 53 mo
Battery Remaining Percentage: 48 %
Battery Voltage: 2.99 V
Brady Statistic AP VP Percent: 1 %
Brady Statistic AP VS Percent: 45 %
Brady Statistic AS VP Percent: 1 %
Brady Statistic AS VS Percent: 54 %
Brady Statistic RA Percent Paced: 44 %
Brady Statistic RV Percent Paced: 1 %
Date Time Interrogation Session: 20220906020017
Implantable Lead Implant Date: 20080331
Implantable Lead Implant Date: 20160503
Implantable Lead Location: 753859
Implantable Lead Location: 753860
Implantable Pulse Generator Implant Date: 20160503
Lead Channel Impedance Value: 210 Ohm
Lead Channel Impedance Value: 340 Ohm
Lead Channel Pacing Threshold Amplitude: 0.875 V
Lead Channel Pacing Threshold Amplitude: 2 V
Lead Channel Pacing Threshold Pulse Width: 0.4 ms
Lead Channel Pacing Threshold Pulse Width: 0.8 ms
Lead Channel Sensing Intrinsic Amplitude: 0.7 mV
Lead Channel Sensing Intrinsic Amplitude: 12 mV
Lead Channel Setting Pacing Amplitude: 1.875
Lead Channel Setting Pacing Amplitude: 2.5 V
Lead Channel Setting Pacing Pulse Width: 0.5 ms
Lead Channel Setting Sensing Sensitivity: 2 mV
Pulse Gen Model: 2240
Pulse Gen Serial Number: 7766577

## 2021-03-08 NOTE — Progress Notes (Signed)
Remote pacemaker transmission.   

## 2021-03-17 ENCOUNTER — Other Ambulatory Visit: Payer: Self-pay | Admitting: Family Medicine

## 2021-03-31 DIAGNOSIS — Z23 Encounter for immunization: Secondary | ICD-10-CM | POA: Diagnosis not present

## 2021-04-19 ENCOUNTER — Other Ambulatory Visit: Payer: Self-pay | Admitting: Family Medicine

## 2021-04-19 DIAGNOSIS — I10 Essential (primary) hypertension: Secondary | ICD-10-CM

## 2021-04-19 DIAGNOSIS — M899 Disorder of bone, unspecified: Secondary | ICD-10-CM

## 2021-04-20 ENCOUNTER — Other Ambulatory Visit: Payer: Medicare Other

## 2021-04-27 ENCOUNTER — Encounter: Payer: Medicare Other | Admitting: Family Medicine

## 2021-05-30 ENCOUNTER — Ambulatory Visit (INDEPENDENT_AMBULATORY_CARE_PROVIDER_SITE_OTHER): Payer: Medicare Other

## 2021-05-30 DIAGNOSIS — I495 Sick sinus syndrome: Secondary | ICD-10-CM | POA: Diagnosis not present

## 2021-05-30 LAB — CUP PACEART REMOTE DEVICE CHECK
Battery Remaining Longevity: 49 mo
Battery Remaining Percentage: 45 %
Battery Voltage: 2.98 V
Brady Statistic AP VP Percent: 1 %
Brady Statistic AP VS Percent: 55 %
Brady Statistic AS VP Percent: 1 %
Brady Statistic AS VS Percent: 45 %
Brady Statistic RA Percent Paced: 54 %
Brady Statistic RV Percent Paced: 1 %
Date Time Interrogation Session: 20221206020015
Implantable Lead Implant Date: 20080331
Implantable Lead Implant Date: 20160503
Implantable Lead Location: 753859
Implantable Lead Location: 753860
Implantable Pulse Generator Implant Date: 20160503
Lead Channel Impedance Value: 180 Ohm
Lead Channel Impedance Value: 340 Ohm
Lead Channel Pacing Threshold Amplitude: 0.75 V
Lead Channel Pacing Threshold Amplitude: 2 V
Lead Channel Pacing Threshold Pulse Width: 0.4 ms
Lead Channel Pacing Threshold Pulse Width: 0.8 ms
Lead Channel Sensing Intrinsic Amplitude: 1.5 mV
Lead Channel Sensing Intrinsic Amplitude: 12 mV
Lead Channel Setting Pacing Amplitude: 1.75 V
Lead Channel Setting Pacing Amplitude: 2.5 V
Lead Channel Setting Pacing Pulse Width: 0.5 ms
Lead Channel Setting Sensing Sensitivity: 2 mV
Pulse Gen Model: 2240
Pulse Gen Serial Number: 7766577

## 2021-06-06 DIAGNOSIS — H2513 Age-related nuclear cataract, bilateral: Secondary | ICD-10-CM | POA: Diagnosis not present

## 2021-06-07 NOTE — Progress Notes (Signed)
Remote pacemaker transmission.   

## 2021-07-24 ENCOUNTER — Encounter: Payer: Medicare Other | Admitting: Family Medicine

## 2021-08-29 ENCOUNTER — Ambulatory Visit (INDEPENDENT_AMBULATORY_CARE_PROVIDER_SITE_OTHER): Payer: Medicare Other

## 2021-08-29 DIAGNOSIS — I495 Sick sinus syndrome: Secondary | ICD-10-CM

## 2021-08-29 LAB — CUP PACEART REMOTE DEVICE CHECK
Battery Remaining Longevity: 47 mo
Battery Remaining Percentage: 43 %
Battery Voltage: 2.98 V
Brady Statistic AP VP Percent: 1 %
Brady Statistic AP VS Percent: 62 %
Brady Statistic AS VP Percent: 1 %
Brady Statistic AS VS Percent: 38 %
Brady Statistic RA Percent Paced: 61 %
Brady Statistic RV Percent Paced: 1 %
Date Time Interrogation Session: 20230307020030
Implantable Lead Implant Date: 20080331
Implantable Lead Implant Date: 20160503
Implantable Lead Location: 753859
Implantable Lead Location: 753860
Implantable Pulse Generator Implant Date: 20160503
Lead Channel Impedance Value: 190 Ohm
Lead Channel Impedance Value: 340 Ohm
Lead Channel Pacing Threshold Amplitude: 0.75 V
Lead Channel Pacing Threshold Amplitude: 2 V
Lead Channel Pacing Threshold Pulse Width: 0.4 ms
Lead Channel Pacing Threshold Pulse Width: 0.8 ms
Lead Channel Sensing Intrinsic Amplitude: 1.2 mV
Lead Channel Sensing Intrinsic Amplitude: 9.7 mV
Lead Channel Setting Pacing Amplitude: 1.75 V
Lead Channel Setting Pacing Amplitude: 2.5 V
Lead Channel Setting Pacing Pulse Width: 0.5 ms
Lead Channel Setting Sensing Sensitivity: 2 mV
Pulse Gen Model: 2240
Pulse Gen Serial Number: 7766577

## 2021-09-08 ENCOUNTER — Telehealth: Payer: Self-pay | Admitting: Family Medicine

## 2021-09-08 NOTE — Telephone Encounter (Signed)
Called and spoke with patients daughter [name] about AWV and if she needs anything sooner. [name] states her mom is fine and nothing urgent is needed. She was mainly worried about her mom not being able to get her refills and such and I assured her that everything will continue and she should not miss anything with her appt being pushed out. She is fine to keep appt as scheduled. I advised [name] if anything is needed before her scheduled appt to please let us know.  ?

## 2021-09-08 NOTE — Telephone Encounter (Signed)
[name] called to schedule her mom's physical ? ?They had gotten behind because of the delay in moving back to [place] ? ?Next available appt is 4.17.23 for a physical.... ? ?Is there a spot where we could add her with MD approval a little earlier, maybe late March?  Mid-morning is preferred ? ?Please advise ?

## 2021-09-11 NOTE — Progress Notes (Signed)
Remote pacemaker transmission.   

## 2021-10-09 ENCOUNTER — Ambulatory Visit (INDEPENDENT_AMBULATORY_CARE_PROVIDER_SITE_OTHER): Payer: Medicare Other | Admitting: Family Medicine

## 2021-10-09 ENCOUNTER — Encounter: Payer: Self-pay | Admitting: Family Medicine

## 2021-10-09 VITALS — BP 146/60 | HR 71 | Temp 97.2°F | Ht 60.0 in | Wt 117.0 lb

## 2021-10-09 DIAGNOSIS — M899 Disorder of bone, unspecified: Secondary | ICD-10-CM

## 2021-10-09 DIAGNOSIS — R0981 Nasal congestion: Secondary | ICD-10-CM

## 2021-10-09 DIAGNOSIS — R21 Rash and other nonspecific skin eruption: Secondary | ICD-10-CM | POA: Diagnosis not present

## 2021-10-09 DIAGNOSIS — R413 Other amnesia: Secondary | ICD-10-CM

## 2021-10-09 DIAGNOSIS — E039 Hypothyroidism, unspecified: Secondary | ICD-10-CM

## 2021-10-09 DIAGNOSIS — M810 Age-related osteoporosis without current pathological fracture: Secondary | ICD-10-CM

## 2021-10-09 DIAGNOSIS — I10 Essential (primary) hypertension: Secondary | ICD-10-CM | POA: Diagnosis not present

## 2021-10-09 DIAGNOSIS — R011 Cardiac murmur, unspecified: Secondary | ICD-10-CM

## 2021-10-09 DIAGNOSIS — Z0000 Encounter for general adult medical examination without abnormal findings: Secondary | ICD-10-CM | POA: Diagnosis not present

## 2021-10-09 DIAGNOSIS — Z7189 Other specified counseling: Secondary | ICD-10-CM

## 2021-10-09 MED ORDER — TRIAMCINOLONE ACETONIDE 0.1 % EX CREA: 1.0000 "application " | TOPICAL_CREAM | Freq: Two times a day (BID) | CUTANEOUS | 1 refills | Status: AC | PRN

## 2021-10-09 MED ORDER — LEVOTHYROXINE SODIUM 50 MCG PO TABS: ORAL_TABLET | ORAL | Status: DC

## 2021-10-09 MED ORDER — LORATADINE 10 MG PO TABS: 10.0000 mg | ORAL_TABLET | Freq: Every day | ORAL | Status: AC

## 2021-10-09 MED ORDER — MELATONIN 3 MG PO TABS: 3.0000 mg | ORAL_TABLET | Freq: Every day | ORAL | 0 refills | Status: AC

## 2021-10-09 NOTE — Progress Notes (Signed)
I have personally reviewed the Medicare Annual Wellness questionnaire and have noted ?1. The patient's medical and social history ?2. Their use of alcohol, tobacco or illicit drugs ?3. Their current medications and supplements ?4. The patient's functional ability including ADL's, fall risks, home safety risks and hearing or visual ?            impairment. ?5. Diet and physical activities ?6. Evidence for depression or mood disorders ? ?The patients weight, height, BMI have been recorded in the chart and visual acuity is per eye clinic.  ?I have made referrals, counseling and provided education to the patient based review of the above and I have provided the pt with a written personalized care plan for preventive services. ? ?Provider list updated- see scanned forms.  Routine anticipatory guidance given to patient.  See health maintenance. The possibility exists that previously documented standard health maintenance information may have been brought forward from a previous encounter into this note.  If needed, that same information has been updated to reflect the current situation based on today's encounter.   ? ?Flu previously done ?Shingles discussed with patient ?PNA previously done ?Tetanus 2022 ?COVID-vaccine previously done ?Colon cancer screening deferred given her age. ?Breast cancer screening deferred by patient. ?Bone density test deferred by patient ?Advance directive-daughter designated if patient were incapacitated ?Cognitive function addressed- see scanned forms- and if abnormal then additional documentation follows.  ? ?In addition to [facility] Wellness, follow up visit for the below conditions: ? ?Hypothyroidism.  Due for f/u labs.  Weight loss noted. Swallowing well.  Eating well.  No neck mass.   ? ?Hypertension:    ?Using medication without problems or lightheadedness: yes ?Chest pain with exertion:no ?Edema:no ?Short of breath:no ? ?1cm pinkish irritated lesion on the L angle of the jaw.  Occ  itchy.  Would fade some with hydrocortisone.   ? ?Had been using flonase at baseline for nasal sniffing.  D/w pt about trial of claritin.   ? ?Osteoporosis.  Using a walker at baseline, when outside. Intolerant of alendronate.   ? ?Memory change/loss prev done.  2/3 recall today.  Family already cut the stove off.  Some days are better than others.  Daughter is checking on patient frequently, living nearby.  Daughter got some help with a caregiver.  Situation is stable/manageable as is for now.  Not wandering.  Occ restlessness noted.  Taking prn melatonin for sleep, not used nightly.  Discussed continuing that.   ? ?She has cardiology f/u pending.  She has ongoing monitoring.   ? ?PMH and SH reviewed ? ?Meds, vitals, and allergies reviewed.  ? ?ROS: Per HPI.  Unless specifically indicated otherwise in HPI, the patient denies: ? ?General: fever. ?Eyes: acute vision changes ?ENT: sore throat ?Cardiovascular: chest pain ?Respiratory: SOB ?GI: vomiting ?GU: dysuria ?Musculoskeletal: acute back pain ?Derm: acute rash ?Neuro: acute motor dysfunction ?Psych: worsening mood ?Endocrine: polydipsia ?Heme: bleeding ?Allergy: hayfever ? ?GEN: nad, alert and oriented to self and year month day but 2 out of 3 on recall. ?HEENT: ncat ?NECK: supple w/o LA ?CV: rrr. SEM noted ?PULM: ctab, no inc wob ?ABD: soft, +bs ?EXT: no edema ?SKIN: 1cm pinkish irritated lesion on the L angle of the jaw.   ?

## 2021-10-09 NOTE — Patient Instructions (Signed)
Go to the lab on the way out.   If you have mychart we'll likely use that to update you.    °Take care.  Glad to see you. °Update me as needed.   °

## 2021-10-10 LAB — CBC WITH DIFFERENTIAL/PLATELET
Basophils Absolute: 0 10*3/uL (ref 0.0–0.1)
Basophils Relative: 0.5 % (ref 0.0–3.0)
Eosinophils Absolute: 0.3 10*3/uL (ref 0.0–0.7)
Eosinophils Relative: 3.2 % (ref 0.0–5.0)
HCT: 39.7 % (ref 36.0–46.0)
Hemoglobin: 13.4 g/dL (ref 12.0–15.0)
Lymphocytes Relative: 29 % (ref 12.0–46.0)
Lymphs Abs: 2.3 10*3/uL (ref 0.7–4.0)
MCHC: 33.6 g/dL (ref 30.0–36.0)
MCV: 89 fl (ref 78.0–100.0)
Monocytes Absolute: 0.8 10*3/uL (ref 0.1–1.0)
Monocytes Relative: 9.9 % (ref 3.0–12.0)
Neutro Abs: 4.6 10*3/uL (ref 1.4–7.7)
Neutrophils Relative %: 57.4 % (ref 43.0–77.0)
Platelets: 155 10*3/uL (ref 150.0–400.0)
RBC: 4.46 Mil/uL (ref 3.87–5.11)
RDW: 14 % (ref 11.5–15.5)
WBC: 7.9 10*3/uL (ref 4.0–10.5)

## 2021-10-10 LAB — COMPREHENSIVE METABOLIC PANEL
ALT: 19 U/L (ref 0–35)
AST: 19 U/L (ref 0–37)
Albumin: 4.4 g/dL (ref 3.5–5.2)
Alkaline Phosphatase: 53 U/L (ref 39–117)
BUN: 30 mg/dL — ABNORMAL HIGH (ref 6–23)
CO2: 25 mEq/L (ref 19–32)
Calcium: 9.8 mg/dL (ref 8.4–10.5)
Chloride: 105 mEq/L (ref 96–112)
Creatinine, Ser: 1.07 mg/dL (ref 0.40–1.20)
GFR: 45.18 mL/min — ABNORMAL LOW (ref >60.00)
Glucose, Bld: 86 mg/dL (ref 70–99)
Potassium: 4.8 mEq/L (ref 3.5–5.1)
Sodium: 139 mEq/L (ref 135–145)
Total Bilirubin: 0.7 mg/dL (ref 0.2–1.2)
Total Protein: 7.1 g/dL (ref 6.0–8.3)

## 2021-10-10 LAB — LIPID PANEL
Cholesterol: 201 mg/dL — ABNORMAL HIGH (ref 0–200)
HDL: 46.7 mg/dL (ref >39.00)
NonHDL: 154.47
Total CHOL/HDL Ratio: 4
Triglycerides: 282 mg/dL — ABNORMAL HIGH (ref 0.0–149.0)
VLDL: 56.4 mg/dL — ABNORMAL HIGH (ref 0.0–40.0)

## 2021-10-10 LAB — LDL CHOLESTEROL, DIRECT: Direct LDL: 126 mg/dL

## 2021-10-10 LAB — TSH: TSH: 1.89 u[IU]/mL (ref 0.35–5.50)

## 2021-10-10 LAB — VITAMIN D 25 HYDROXY (VIT D DEFICIENCY, FRACTURES): VITD: 35.2 ng/mL (ref 30.00–100.00)

## 2021-10-12 DIAGNOSIS — R0981 Nasal congestion: Secondary | ICD-10-CM | POA: Insufficient documentation

## 2021-10-12 NOTE — Assessment & Plan Note (Signed)
Memory change/loss prev done.  2/3 recall today.  Family already cut the stove off.  Some days are better than others.  Daughter is checking on patient frequently, living nearby.  Daughter got some help with a caregiver.  Situation is stable/manageable as is for now.  Not wandering.  Occ restlessness noted.  Taking prn melatonin for sleep, not used nightly.  Discussed continuing that.   Her situation is manageable at home currently and they can update me as needed.  Recheck periodically. ?

## 2021-10-12 NOTE — Assessment & Plan Note (Signed)
Benign appearing, reasonable to try a small amount of triamcinolone to see if it will help the lesion resolved.  It was partially responsive to hydrocortisone. ?

## 2021-10-12 NOTE — Assessment & Plan Note (Signed)
Using a walker at baseline, when outside. Intolerant of alendronate.   Fall precautions discussed with patient. ?

## 2021-10-12 NOTE — Assessment & Plan Note (Signed)
Can try Claritin.  Continue Flonase. ?

## 2021-10-12 NOTE — Assessment & Plan Note (Signed)
Flu previously done ?Shingles discussed with patient ?PNA previously done ?Tetanus 2022 ?COVID-vaccine previously done ?Colon cancer screening deferred given her age. ?Breast cancer screening deferred by patient. ?Bone density test deferred by patient ?Advance directive-daughter designated if patient were incapacitated ?Cognitive function addressed- see scanned forms- and if abnormal then additional documentation follows.  ?

## 2021-10-12 NOTE — Assessment & Plan Note (Signed)
History of, with routine cardiology follow-up. ?

## 2021-10-12 NOTE — Assessment & Plan Note (Signed)
Advance directive- daughter designated if patient were incapacitated.   

## 2021-10-12 NOTE — Assessment & Plan Note (Signed)
Due for f/u labs.  Weight loss noted. Swallowing well.  Eating well.  No neck mass.   Continue levothyroxine.  See notes on labs. ?

## 2021-10-12 NOTE — Assessment & Plan Note (Signed)
Continue losartan.  See notes on labs. ?

## 2021-11-02 DIAGNOSIS — R21 Rash and other nonspecific skin eruption: Secondary | ICD-10-CM | POA: Diagnosis not present

## 2021-11-19 ENCOUNTER — Other Ambulatory Visit: Payer: Self-pay | Admitting: Family Medicine

## 2021-11-21 ENCOUNTER — Telehealth: Payer: Self-pay | Admitting: Family Medicine

## 2021-11-21 MED ORDER — PERMETHRIN 5 % EX CREA: TOPICAL_CREAM | CUTANEOUS | 0 refills | Status: AC

## 2021-11-21 NOTE — Telephone Encounter (Addendum)
I would try using permethrin cream.  Apply to all areas of the body from the neck to soles of feet, leave on for 12 hours before washing/showering.  Would wash all bedding and recently worn clothes after use.  Let me know if this isn't improving in the next few days.  Rx sent.  Thanks.

## 2021-11-21 NOTE — Telephone Encounter (Signed)
[place] [facility] - Client TELEPHONE ADVICE RECORD [facility] Patient Name: [name] Gender: Female DOB: [birth year] Age: 88 Y 28 M 8 D Return Phone Number: [phone] (Primary) Address: City/ State/ Zip: [place] [address] Client [name] - Client Client Site [place] - Night Provider Renford Dills - MD Contact Type Call Who Is Calling Patient / Member / Family / Caregiver Call Type Triage / Clinical Caller Name Lorraine Lax Relationship To Patient Daughter Return Phone Number [phone] (Primary) Chief Complaint Rash - Purple Spots Or Dots Reason for Call Symptomatic / Request for [name] states that her Mother has a rashall over her whole body. Rx cream is not working. Patient has spots [name] her legs and feet. Caller states that patient has been exposed to bed bugs. Translation No Nurse Assessment Nurse: Ronnald Ramp, RN, Rollene Fare Date/Time (Eastern Time): 11/20/2021 4:15:06 PM Confirm and document reason for call. If symptomatic, describe symptoms. ---Caller states that her Mother has a rash all over her whole body. Rx cream is not working. Patient has spots [name] her legs and feet. Caller states that patient has been exposed to bed bugs. Does the patient have any new or worsening symptoms? ---Yes Will a triage be completed? ---Yes Related visit to physician within the last 2 weeks? ---Yes Does the PT have any chronic conditions? (i.e. diabetes, asthma, this includes High risk factors for pregnancy, etc.) ---Yes List chronic conditions. ---HTN thyroid Is this a behavioral health or substance abuse call? ---No Guidelines Guideline Title Affirmed Question Affirmed Notes Nurse Date/Time (Eastern Time) Bed Bug Bite [1] After 7 days AND [2] rash from bed bug bites is not improving Ronnald Ramp, RN, [facility] 11/20/2021 4:19:54 PM Disp. Time Eilene Ghazi Time) Disposition  Final User PLEASE NOTE: All timestamps contained within this report are represented as [place] Standard Time. CONFIDENTIALTY NOTICE: This fax transmission is intended only for the addressee. It contains information that is legally privileged, confidential or otherwise protected from use or disclosure. If you are not the intended recipient, you are strictly prohibited from reviewing, disclosing, copying using or disseminating any of this information or taking any action in reliance [name] or regarding this information. If you have received this fax in error, please notify us immediately by telephone so that we can arrange for its return to [place]. Phone: [phone], Toll-Free: [phone], Fax: [phone] Page: 2 of 2 Call Id: [id number] 11/20/2021 4:28:27 PM SEE PCP WITHIN 3 DAYS Yes Ronnald Ramp, RN, Doree Albee Disagree/Comply Comply Caller Understands Yes PreDisposition Call Doctor Care Advice Given Per Guideline SEE PCP WITHIN 3 DAYS: * You need to be seen within 2 or 3 days. * PCP VISIT: Call your doctor (or NP/PA) during regular office hours and make an appointment. A clinic or urgent care center are good places to go for care if your doctor's office is closed or you can't get an appointment. NOTE: If office will be open tomorrow, tell caller to call then, not in 3 days. ANTIBIOTIC OINTMENT: * Apply an antibiotic ointment (OTC). * Do this 4 times per day. CALL BACK IF: * Fever occurs * Spreading redness or red streaks * You become worse Referrals REFERRED TO PCP OFFIC

## 2021-11-21 NOTE — Addendum Note (Signed)
Addended by: Tonia Ghent on: 11/21/2021 05:19 PM   Modules accepted: Orders

## 2021-11-21 NOTE — Telephone Encounter (Signed)
I spoke with [name] (DPR signed) [name] said that Dr Damita Dunnings saw rash when pt was here on 10/09/21 and the Triamcinolone cream is not helping rash. [name] said pt was seen at [facility] 11/02/21 and was given famotidine. [name] said over weekend discovered pt has bed bugs(a lot of them). [name] said she  burned the mattress that had the bed bugs on it and sprayed and cleaned the bedroom. Pt slept in different bedroom last night and the rash does not appear as bad today.[name] does not want to bring pt to office to expose anyone here with bed bugs and hopes rx can be sent to [place]. Tina request cb after reviewed by Dr Damita Dunnings. Sending note to Dr Damita Dunnings and Janett Billow CMA.

## 2021-11-21 NOTE — Telephone Encounter (Signed)
Pt daughter called and said she had talked to a nurse with the after hours line yesterday and said that they told her to call today to see if Dr Damita Dunnings could prescribe her something, she said she has a rash all over her body with purple spots or dots and the RX cream is not working. She said pt has been exposed to bed bugs as well. 11/02/21 at Urgent care gave her Famotidine to take for it to help with itching and also a an anti itch lotion. She said it has helped some. Call back is [phone], she would mainly like some advice on this.

## 2021-11-22 NOTE — Telephone Encounter (Signed)
lmtcb

## 2021-11-22 NOTE — Telephone Encounter (Signed)
Spoke with patients daughter and discussed medication sent in and instructions. [name] verbalized understanding.

## 2021-11-22 NOTE — Telephone Encounter (Signed)
Patient returning a call from [place].

## 2021-11-28 ENCOUNTER — Ambulatory Visit (INDEPENDENT_AMBULATORY_CARE_PROVIDER_SITE_OTHER): Payer: Medicare Other

## 2021-11-28 DIAGNOSIS — I495 Sick sinus syndrome: Secondary | ICD-10-CM

## 2021-11-28 LAB — CUP PACEART REMOTE DEVICE CHECK
Battery Remaining Longevity: 44 mo
Battery Remaining Percentage: 41 %
Battery Voltage: 2.98 V
Brady Statistic AP VP Percent: 1 %
Brady Statistic AP VS Percent: 64 %
Brady Statistic AS VP Percent: 1 %
Brady Statistic AS VS Percent: 35 %
Brady Statistic RA Percent Paced: 63 %
Brady Statistic RV Percent Paced: 1 %
Date Time Interrogation Session: 20230606020021
Implantable Lead Implant Date: 20080331
Implantable Lead Implant Date: 20160503
Implantable Lead Location: 753859
Implantable Lead Location: 753860
Implantable Pulse Generator Implant Date: 20160503
Lead Channel Impedance Value: 210 Ohm
Lead Channel Impedance Value: 340 Ohm
Lead Channel Pacing Threshold Amplitude: 1 V
Lead Channel Pacing Threshold Amplitude: 2 V
Lead Channel Pacing Threshold Pulse Width: 0.4 ms
Lead Channel Pacing Threshold Pulse Width: 0.8 ms
Lead Channel Sensing Intrinsic Amplitude: 12 mV
Lead Channel Sensing Intrinsic Amplitude: 2.6 mV
Lead Channel Setting Pacing Amplitude: 2 V
Lead Channel Setting Pacing Amplitude: 2.5 V
Lead Channel Setting Pacing Pulse Width: 0.5 ms
Lead Channel Setting Sensing Sensitivity: 2 mV
Pulse Gen Model: 2240
Pulse Gen Serial Number: 7766577

## 2021-12-12 NOTE — Progress Notes (Signed)
Remote pacemaker transmission.   

## 2022-02-23 ENCOUNTER — Encounter: Payer: Medicare Other | Admitting: Student

## 2022-02-27 ENCOUNTER — Ambulatory Visit (INDEPENDENT_AMBULATORY_CARE_PROVIDER_SITE_OTHER): Payer: Medicare Other

## 2022-02-27 DIAGNOSIS — I495 Sick sinus syndrome: Secondary | ICD-10-CM | POA: Diagnosis not present

## 2022-02-27 LAB — CUP PACEART REMOTE DEVICE CHECK
Battery Remaining Longevity: 41 mo
Battery Remaining Percentage: 39 %
Battery Voltage: 2.98 V
Brady Statistic AP VP Percent: 1 %
Brady Statistic AP VS Percent: 67 %
Brady Statistic AS VP Percent: 1 %
Brady Statistic AS VS Percent: 33 %
Brady Statistic RA Percent Paced: 66 %
Brady Statistic RV Percent Paced: 1 %
Date Time Interrogation Session: 20230905023528
Implantable Lead Implant Date: 20080331
Implantable Lead Implant Date: 20160503
Implantable Lead Location: 753859
Implantable Lead Location: 753860
Implantable Pulse Generator Implant Date: 20160503
Lead Channel Impedance Value: 190 Ohm
Lead Channel Impedance Value: 340 Ohm
Lead Channel Pacing Threshold Amplitude: 0.875 V
Lead Channel Pacing Threshold Amplitude: 2 V
Lead Channel Pacing Threshold Pulse Width: 0.4 ms
Lead Channel Pacing Threshold Pulse Width: 0.8 ms
Lead Channel Sensing Intrinsic Amplitude: 12 mV
Lead Channel Sensing Intrinsic Amplitude: 2.5 mV
Lead Channel Setting Pacing Amplitude: 1.875
Lead Channel Setting Pacing Amplitude: 2.5 V
Lead Channel Setting Pacing Pulse Width: 0.5 ms
Lead Channel Setting Sensing Sensitivity: 2 mV
Pulse Gen Model: 2240
Pulse Gen Serial Number: 7766577

## 2022-03-02 ENCOUNTER — Other Ambulatory Visit: Payer: Self-pay | Admitting: Family Medicine

## 2022-03-20 NOTE — Progress Notes (Signed)
Remote pacemaker transmission.   

## 2022-04-19 DIAGNOSIS — Z23 Encounter for immunization: Secondary | ICD-10-CM | POA: Diagnosis not present

## 2022-05-22 ENCOUNTER — Encounter: Payer: Medicare Other | Admitting: Internal Medicine

## 2022-05-22 ENCOUNTER — Ambulatory Visit: Payer: Medicare Other | Attending: Internal Medicine | Admitting: Internal Medicine

## 2022-05-22 ENCOUNTER — Encounter: Payer: Self-pay | Admitting: Internal Medicine

## 2022-05-22 VITALS — BP 120/60 | HR 60 | Ht 60.0 in | Wt 116.2 lb

## 2022-05-22 DIAGNOSIS — I495 Sick sinus syndrome: Secondary | ICD-10-CM | POA: Diagnosis not present

## 2022-05-22 DIAGNOSIS — I2510 Atherosclerotic heart disease of native coronary artery without angina pectoris: Secondary | ICD-10-CM | POA: Diagnosis not present

## 2022-05-22 DIAGNOSIS — Z950 Presence of cardiac pacemaker: Secondary | ICD-10-CM | POA: Diagnosis not present

## 2022-05-22 DIAGNOSIS — Z9861 Coronary angioplasty status: Secondary | ICD-10-CM | POA: Diagnosis not present

## 2022-05-22 LAB — CUP PACEART INCLINIC DEVICE CHECK
Battery Remaining Longevity: 45 mo
Battery Voltage: 2.96 V
Brady Statistic RA Percent Paced: 68 %
Brady Statistic RV Percent Paced: 0.1 %
Date Time Interrogation Session: 20231128124726
Implantable Lead Connection Status: 753985
Implantable Lead Connection Status: 753985
Implantable Lead Implant Date: 20080331
Implantable Lead Implant Date: 20160503
Implantable Lead Location: 753859
Implantable Lead Location: 753860
Implantable Pulse Generator Implant Date: 20160503
Lead Channel Impedance Value: 225 Ohm
Lead Channel Impedance Value: 337.5 Ohm
Lead Channel Pacing Threshold Amplitude: 0.75 V
Lead Channel Pacing Threshold Amplitude: 0.75 V
Lead Channel Pacing Threshold Amplitude: 1.25 V
Lead Channel Pacing Threshold Amplitude: 1.25 V
Lead Channel Pacing Threshold Pulse Width: 0.4 ms
Lead Channel Pacing Threshold Pulse Width: 0.4 ms
Lead Channel Pacing Threshold Pulse Width: 0.8 ms
Lead Channel Pacing Threshold Pulse Width: 0.8 ms
Lead Channel Sensing Intrinsic Amplitude: 12 mV
Lead Channel Sensing Intrinsic Amplitude: 2.5 mV
Lead Channel Setting Pacing Amplitude: 1.875
Lead Channel Setting Pacing Amplitude: 2.5 V
Lead Channel Setting Pacing Pulse Width: 0.8 ms
Lead Channel Setting Sensing Sensitivity: 2 mV
Pulse Gen Model: 2240
Pulse Gen Serial Number: 7766577

## 2022-05-22 LAB — PACEMAKER DEVICE OBSERVATION

## 2022-05-22 NOTE — Patient Instructions (Signed)
Medication Instructions:  - Your physician recommends that you continue on your current medications as directed. Please refer to the Current Medication list given to you today.  *If you need a refill on your cardiac medications before your next appointment, please call your pharmacy*   Lab Work: - none ordered  If you have labs (blood work) drawn today and your tests are completely normal, you will receive your results only by: MyChart Message (if you have MyChart) OR A paper copy in the mail If you have any lab test that is abnormal or we need to change your treatment, we will call you to review the results.   Testing/Procedures: - none ordered   Follow-Up: At [facility], you and your health needs are our priority.  As part of our continuing mission to provide you with exceptional heart care, we have created designated Provider Care Teams.  These Care Teams include your primary Cardiologist (physician) and Advanced Practice Providers (APPs -  Physician Assistants and Nurse Practitioners) who all work together to provide you with the care you need, when you need it.  We recommend signing up for the patient portal called "MyChart".  Sign up information is provided on this After Visit Summary.  MyChart is used to connect with patients for Virtual Visits (Telemedicine).  Patients are able to view lab/test results, encounter notes, upcoming appointments, etc.  Non-urgent messages can be sent to your provider as well.   To learn more about what you can do with MyChart, go to [email or web address].    Your next appointment:   1 year(s)  The format for your next appointment:   In Person  Provider:   Steven Klein, MD    Other Instructions N/a  Important Information About Sugar       

## 2022-05-22 NOTE — Progress Notes (Signed)
Patient Care Team: Tonia Ghent, MD as PCP - General (Family Medicine) Dingeldein, Remo Lipps, MD as Consulting Physician (Ophthalmology) Ronald Lobo, MD as Consulting Physician (Gastroenterology) Dasher, Rayvon Char, MD as Consulting Physician (Dermatology)   HPI   [name] is a 88 y.o. female Previously followed by Dr. Thermon Leyland with a pacemaker implanted for sinus node dysfunction with [facility] Jude generator change 2016.   History of coronary artery disease w/ stenting to her RCA 2008 in the context of an inferior wall MI. Hx of RV elevated threshold, low impedance (Unipolar)  History of atrial fibrillation remotely but none documented of late and is not on anticoagulation  Echo EF 2014 normal aortic sclerosis without stenosis Myoview 2016 no ischemia     Date Cr K TSH Hgb  8/18 1.07 4.2    10/21 1.04 4.5 0.45<<5.54   4/23 1.07 4.8 1.89 13.4    The patient denies chest pain, shortness of breath, nocturnal dyspnea, orthopnea or peripheral edema.  There have been no palpitations, lightheadedness or syncope.  Complains of some memory issues.   Now using a walker      Past Medical History:  Diagnosis Date   Allergy    Arthritis    CAD (coronary artery disease)    Fatty liver    on u/s 2010   Heart murmur    Hyperlipidemia    Hypertension    Insomnia    Myocardial infarction [facility])    Osteoporosis    dxa 2013   PAF (paroxysmal atrial fibrillation) ([place])    Shingles 11/08/2019   [facility]   SSS (sick sinus syndrome) ([place])    St. Jude Permanent Pacemaker 09/23/06   Symptomatic bradycardia     Past Surgical History:  Procedure Laterality Date   BACK SURGERY     CARPAL TUNNEL RELEASE     left   CHOLECYSTECTOMY     CORONARY ANGIOPLASTY WITH STENT PLACEMENT  12/03/06   Stent to distal RCA   CORONARY ANGIOPLASTY WITH STENT PLACEMENT  09/12/06   Stenting mid distal segment & PTCA alone distal RCA prox. to the posterior descending artery takeoff.    CORONARY STENT PLACEMENT     RCA stent replaced    EP IMPLANTABLE DEVICE N/A 10/26/2014   Procedure: PPM Generator Changeout;  Surgeon: Sanda Javaun Dimperio, MD;  Location: [place];  Service: Cardiovascular;  Laterality: N/A;   EP IMPLANTABLE DEVICE N/A 10/26/2014   Procedure: Pacemaker Revision- new RV Lead;  Surgeon: Sanda Shelton Square, MD;  Location: [place];  Service: Cardiovascular;  Laterality: N/A;   FOOT SURGERY     Left   HERNIA REPAIR     R inguinal   PACEMAKER INSERTION  09/23/06   St.Jude   US ECHOCARDIOGRAPHY  11/26/11   prox. septal thickening, mild concentric LVH, mod. ca+ AOV,trace AI,mild to mod valvular aortic stenosis.    Current Outpatient Medications  Medication Sig Dispense Refill   Acetaminophen (TYLENOL 8 HOUR PO) Take by mouth as needed.     aspirin 81 MG tablet Take 81 mg by mouth daily.     cholecalciferol (VITAMIN D) 1000 units tablet Take 1,000 Units by mouth daily.     cyanocobalamin 1000 MCG tablet Take 1,000 mcg by mouth daily.     docusate sodium (COLACE) 100 MG capsule Take 100 mg by mouth daily.     fluticasone (FLONASE) 50 MCG/ACT nasal spray Place 2 sprays into both nostrils daily as needed  for allergies or rhinitis. 16 g prn   ibuprofen (ADVIL) 200 MG tablet Take 1 tablet (200 mg total) by mouth every 8 (eight) hours as needed (with food).     levothyroxine (SYNTHROID) 50 MCG tablet TAKE 1 TABLET BY MOUTH  DAILY 90 tablet 2   loratadine (CLARITIN) 10 MG tablet Take 1 tablet (10 mg total) by mouth daily.     losartan (COZAAR) 50 MG tablet TAKE 1 TABLET BY MOUTH  DAILY 90 tablet 3   melatonin 3 MG TABS tablet Take 1-2 tablets (3-6 mg total) by mouth at bedtime.  0   nitroGLYCERIN (NITROSTAT) 0.4 MG SL tablet Place 1 tablet (0.4 mg total) under the tongue every 5 (five) minutes as needed. 25 tablet 12   permethrin (ELIMITE) 5 % cream Apply once to all areas of the body from the neck to soles of feet, leave on for 12 hours before washing/showering.  60 g 0   polyethylene glycol (MIRALAX / GLYCOLAX) 17 g packet Take 17 g by mouth daily as needed.     triamcinolone cream (KENALOG) 0.1 % Apply 1 application. topically 2 (two) times daily as needed. 30 g 1   No current facility-administered medications for this visit.    Allergies  Allergen Reactions   Simvastatin     Presumed cause of LFT elevation.   Fosamax [Alendronate Sodium] Other (See Comments)    Anxious and "I just didn't feel right" after each dose; 2.5 month trial      Review of Systems negative except from HPI and PMH  Physical Exam BP 120/60 (BP Location: Left Arm, Patient Position: Sitting, Cuff Size: Normal)   Pulse 60   Ht 5' (1.524 m)   Wt 116 lb 4 oz (52.7 kg)   SpO2 98%   BMI 22.70 kg/m  Well developed and well nourished in no acute distress HENT normal Neck supple with JVP-flat Clear Device pocket well healed; without hematoma or erythema.  There is no tethering  Regular rate and rhythm, gallop 3/6 murmur single s2 Abd-soft with active BS No Clubbing cyanosis  edema Skin-warm and dry A & Oriented  Grossly normal sensory and motor function  ECG A pacing@ 60 07/02/06/42  Device function is normal. Programming changes to improve pacing margin on RV ( unipolar   See Paceart for details    Assessment and  Plan  Coronary artery disease prior stenting  Pacemaker-St. 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  Does not ventricularly pacing.  Not really an issue.  Blood pressures are much better.  We will continue her on losartan 50.  Continue low-dose aspirin with large vessel calcifications  Significant murmur and probably significant aortic stenosis.  However, in the absence of symptoms, we will not pursue repeat echo         .

## 2022-05-29 ENCOUNTER — Ambulatory Visit (INDEPENDENT_AMBULATORY_CARE_PROVIDER_SITE_OTHER): Payer: Medicare Other

## 2022-05-29 DIAGNOSIS — I495 Sick sinus syndrome: Secondary | ICD-10-CM | POA: Diagnosis not present

## 2022-05-29 LAB — CUP PACEART REMOTE DEVICE CHECK
Battery Remaining Longevity: 37 mo
Battery Remaining Percentage: 36 %
Battery Voltage: 2.96 V
Brady Statistic AP VP Percent: 1 %
Brady Statistic AP VS Percent: 91 %
Brady Statistic AS VP Percent: 1 %
Brady Statistic AS VS Percent: 8.5 %
Brady Statistic RA Percent Paced: 90 %
Brady Statistic RV Percent Paced: 1 %
Date Time Interrogation Session: 20231205020015
Implantable Lead Connection Status: 753985
Implantable Lead Connection Status: 753985
Implantable Lead Implant Date: 20080331
Implantable Lead Implant Date: 20160503
Implantable Lead Location: 753859
Implantable Lead Location: 753860
Implantable Pulse Generator Implant Date: 20160503
Lead Channel Impedance Value: 210 Ohm
Lead Channel Impedance Value: 350 Ohm
Lead Channel Pacing Threshold Amplitude: 0.875 V
Lead Channel Pacing Threshold Amplitude: 1.25 V
Lead Channel Pacing Threshold Pulse Width: 0.4 ms
Lead Channel Pacing Threshold Pulse Width: 0.8 ms
Lead Channel Sensing Intrinsic Amplitude: 12 mV
Lead Channel Sensing Intrinsic Amplitude: 2.6 mV
Lead Channel Setting Pacing Amplitude: 1.875
Lead Channel Setting Pacing Amplitude: 2.5 V
Lead Channel Setting Pacing Pulse Width: 0.8 ms
Lead Channel Setting Sensing Sensitivity: 2 mV
Pulse Gen Model: 2240
Pulse Gen Serial Number: 7766577

## 2022-06-06 DIAGNOSIS — H2513 Age-related nuclear cataract, bilateral: Secondary | ICD-10-CM | POA: Diagnosis not present

## 2022-06-26 NOTE — Progress Notes (Signed)
Remote pacemaker transmission.   

## 2022-07-12 ENCOUNTER — Emergency Department (HOSPITAL_COMMUNITY)
Admission: EM | Admit: 2022-07-12 | Discharge: 2022-07-26 | Disposition: E | Payer: Medicare Other | Attending: Emergency Medicine | Admitting: Emergency Medicine

## 2022-07-12 DIAGNOSIS — W19XXXA Unspecified fall, initial encounter: Secondary | ICD-10-CM | POA: Diagnosis not present

## 2022-07-12 DIAGNOSIS — I469 Cardiac arrest, cause unspecified: Secondary | ICD-10-CM

## 2022-07-12 DIAGNOSIS — I10 Essential (primary) hypertension: Secondary | ICD-10-CM | POA: Insufficient documentation

## 2022-07-12 DIAGNOSIS — R739 Hyperglycemia, unspecified: Secondary | ICD-10-CM | POA: Diagnosis not present

## 2022-07-12 DIAGNOSIS — R000 Tachycardia, unspecified: Secondary | ICD-10-CM | POA: Diagnosis not present

## 2022-07-12 DIAGNOSIS — R4182 Altered mental status, unspecified: Secondary | ICD-10-CM | POA: Diagnosis not present

## 2022-07-12 DIAGNOSIS — Z79899 Other long term (current) drug therapy: Secondary | ICD-10-CM | POA: Insufficient documentation

## 2022-07-12 DIAGNOSIS — Z7982 Long term (current) use of aspirin: Secondary | ICD-10-CM | POA: Insufficient documentation

## 2022-07-12 DIAGNOSIS — S0291XA Unspecified fracture of skull, initial encounter for closed fracture: Secondary | ICD-10-CM | POA: Diagnosis not present

## 2022-07-12 DIAGNOSIS — S0990XA Unspecified injury of head, initial encounter: Secondary | ICD-10-CM | POA: Diagnosis not present

## 2022-07-12 DIAGNOSIS — I2510 Atherosclerotic heart disease of native coronary artery without angina pectoris: Secondary | ICD-10-CM | POA: Insufficient documentation

## 2022-07-12 DIAGNOSIS — R404 Transient alteration of awareness: Secondary | ICD-10-CM | POA: Diagnosis not present

## 2022-07-12 DIAGNOSIS — S0083XA Contusion of other part of head, initial encounter: Secondary | ICD-10-CM | POA: Diagnosis not present

## 2022-07-12 LAB — CBG MONITORING, ED: Glucose-Capillary: 253 mg/dL — ABNORMAL HIGH (ref 70–99)

## 2022-07-20 ENCOUNTER — Telehealth: Payer: Self-pay | Admitting: Family Medicine

## 2022-07-20 NOTE — Telephone Encounter (Signed)
Late entry, tried to call family yesterday to let them know I was thinking about them.  I was glad to see this patient in clinic.

## 2022-07-26 NOTE — Progress Notes (Signed)
Responded to ED  death page to support Daughter at bedside. Chaplain provided emotional, Spiritual and grief support to daughter/[name]. Funeral home information per daughter is [facility], [place] , [place].  This is information was given to Pt. Nurse. Chaplain available as needed.  Jaclynn Major, [place], [facility], Pager [phone]

## 2022-07-26 NOTE — ED Provider Notes (Signed)
[facility] EMERGENCY DEPARTMENT Provider Note   CSN: [id number] Arrival date & time: 07/15/2022  7096     History  Chief Complaint  Patient presents with   Cardiac Arrest    [name] is a 89 y.o. female.  89 year old female with history of CAD status post PCI, sick sinus syndrome status post pacemaker, HTN, and HLD who presents to the emergency department with fall and altered mental status.  Patient was reported last seen by her daughter at 5 PM yesterday.  Says that she went to visit her mother today and found her outside on the ground.  Her mother was not responding and she called 911.  When EMS arrived they noticed a hematoma on the patient's right forehead and facial trauma.  They reported that she had agonal breathing and started CPR at 847.  They intubated her and gave her 1 L of fluids.  She was given 8 rounds of epinephrine.       Home Medications Prior to Admission medications   Medication Sig Start Date End Date Taking? Authorizing Provider  Acetaminophen (TYLENOL 8 HOUR PO) Take by mouth as needed.    [provider]  aspirin 81 MG tablet Take 81 mg by mouth daily.    [provider]  cholecalciferol (VITAMIN D) 1000 units tablet Take 1,000 Units by mouth daily.    [provider]  cyanocobalamin 1000 MCG tablet Take 1,000 mcg by mouth daily.    [provider]  docusate sodium (COLACE) 100 MG capsule Take 100 mg by mouth daily.    [provider]  fluticasone (FLONASE) 50 MCG/ACT nasal spray Place 2 sprays into both nostrils daily as needed for allergies or rhinitis. 05/14/19   Tonia Ghent, MD  ibuprofen (ADVIL) 200 MG tablet Take 1 tablet (200 mg total) by mouth every 8 (eight) hours as needed (with food). 01/06/21   Tonia Ghent, MD  levothyroxine (SYNTHROID) 50 MCG tablet TAKE 1 TABLET BY MOUTH  DAILY 03/02/22   Tonia Ghent, MD  loratadine (CLARITIN) 10 MG tablet Take 1 tablet (10 mg total)  by mouth daily. 10/09/21   Tonia Ghent, MD  losartan (COZAAR) 50 MG tablet TAKE 1 TABLET BY MOUTH  DAILY 11/21/21   Tonia Ghent, MD  melatonin 3 MG TABS tablet Take 1-2 tablets (3-6 mg total) by mouth at bedtime. 10/09/21   Tonia Ghent, MD  nitroGLYCERIN (NITROSTAT) 0.4 MG SL tablet Place 1 tablet (0.4 mg total) under the tongue every 5 (five) minutes as needed. 04/09/17   Tonia Ghent, MD  permethrin (ELIMITE) 5 % cream Apply once to all areas of the body from the neck to soles of feet, leave on for 12 hours before washing/showering. 11/21/21   Tonia Ghent, MD  polyethylene glycol (MIRALAX / GLYCOLAX) 17 g packet Take 17 g by mouth daily as needed.    [provider]  triamcinolone cream (KENALOG) 0.1 % Apply 1 application. topically 2 (two) times daily as needed. 10/09/21   Tonia Ghent, MD      Allergies    Simvastatin and Fosamax [alendronate sodium]    Review of Systems   Review of Systems  Physical Exam Updated Vital Signs Pulse (!) 0   Temp (!) 77 F (25 C) (Core)   Ht 5' (1.524 m)   Wt 52.7 kg   BMI 22.69 kg/m  Physical Exam Vitals and nursing note reviewed.  Constitutional:  General: She is in acute distress.     Appearance: She is well-developed. She is ill-appearing.     Comments: Ongoing compressions with Linton Rump device.  In PEA.  HENT:     Head: Normocephalic.     Comments: Hematoma to right forehead.  Bleeding from bilateral naris.  Intubated    Right Ear: External ear normal.     Left Ear: External ear normal.     Nose: Nose normal.  Eyes:     Comments: Pupils blown and fixed bilaterally.  Not withdrawing extremities to pain.  Cardiovascular:     Rate and Rhythm: Bradycardia present. Rhythm irregular.     Comments: PEA on the monitor Pulmonary:     Comments: Intubated with diminished breath sounds on the left lung.  7.0 ET tube withdrawn to 24 cm.  Bloody secretions coming from endotracheal tube. Abdominal:     General:  Abdomen is flat. There is distension.     Palpations: Abdomen is soft.  Musculoskeletal:     Cervical back: Normal range of motion and neck supple.     Right lower leg: No edema.     Left lower leg: No edema.  Skin:    Comments: Cold  Neurological:     Comments: See above for neuroexam     ED Results / Procedures / Treatments   Labs (all labs ordered are listed, but only abnormal results are displayed) Labs Reviewed  CBG MONITORING, ED - Abnormal; Notable for the following components:      Result Value   Glucose-Capillary 253 (*)    All other components within normal limits    EKG None  Radiology No results found.  Procedures CPR  Date/Time: 2022-08-01 10:28 AM  Performed by: Fransico Meadow, MD Authorized by: Fransico Meadow, MD  CPR Procedure Details:      Amount of time prior to administration of ACLS/BLS (minutes):  30   ACLS/BLS initiated by EMS: Yes     CPR/ACLS performed in the ED: Yes     Outcome: Pt declared dead    CPR performed via ACLS guidelines under my direct supervision.  See RN documentation for details including defibrillator use, medications, doses and timing.    EMERGENCY DEPARTMENT [place] CARDIAC EXAM "Study: Limited Ultrasound of the Heart and Pericardium"  INDICATIONS:Cardiac arrest Multiple views of the heart and pericardium were obtained in real-time with a multi-frequency probe.  PERFORMED PZ:WCHENI IMAGES ARCHIVED?: No LIMITATIONS:  Emergent procedure VIEWS USED: Subcostal 4 chamber and Parasternal long axis INTERPRETATION: Cardiac activity absent and Pericardial effusioin absent   Medications Ordered in ED Medications - No data to display  ED Course/ Medical Decision Making/ A&P Clinical Course as of 07/13/22 1443  01-Aug-2022 Medical examiner is excepted the case.  Patient will be transferred to [facility].  Daughter at the bedside. [RP]    Clinical Course User Index [RP] Fransico Meadow, MD                               Medical Decision Making  [name] is a 89 y.o. female with comorbidities that complicate the patient evaluation including CAD status post PCI, sick sinus syndrome status post pacemaker, HTN, and HLD who presents to the emergency department with fall and altered mental status.    Initial Ddx:  Head trauma, MI, hypothermic arrest  MDM:  Patient hypothermic cardiac arrest on arrival.  May  have had preceding head trauma, syncopal event, or MI that led to her fall.  Very concerned about patient's mental status given her hematoma on her head as well as her fixed and dilated pupils.  Will continue ACLS for now and contact daughter regarding [place].  Plan:  I-STAT labs ACLS  ED Summary/Re-evaluation:  Patient was coded in the emergency department.  Was found to be hypothermic and so continued the resuscitation and put a Bair hugger on gave her warm fluids.  Unable to obtain i-STAT labs due to difficult with IV access.  Did contact the patient's daughter and had extensive conversation regarding her CODE STATUS.  Does have a living will where the patient declared that she would desire a natural death.  Did also review notes from 2016 where she stated that she preferred to be DNR/DNI.  Though patient is hypothermic do not feel that continued attempts would be in the patient's best interest and she is expressed that she would not like to have CPR performed or to be placed on a ventilator.  Cardiac ultrasound was performed which did not show cardiac standstill.  At that time compressions were withheld along with further resuscitation attempts.  Time of death was 63: 60 AM.  Was discussed with the medical examiner [place] who has accepted the case for further evaluation.  This patient presents to the ED for concern of complaints listed in HPI, this involves an extensive number of treatment options, and is a complaint that carries with it a high risk of complications and morbidity. Disposition  including potential need for admission considered.   Dispo: Expired -medical examiner  Additional history obtained from daughter Records reviewed Outpatient Clinic Notes I personally reviewed and interpreted cardiac monitoring:  PEA Consults:  Medical examiner Social Determinants of health:  Elderly  Final Clinical Impression(s) / ED Diagnoses Final diagnoses:  Cardiac arrest ([place])  Traumatic injury of head, initial encounter    Rx / DC Orders ED Discharge Orders     None      CRITICAL CARE Performed by: Fransico Meadow   Total critical care time: 60 minutes  Critical care time was exclusive of separately billable procedures and treating other patients.  Critical care was necessary to treat or prevent imminent or life-threatening deterioration.  Critical care was time spent personally by me on the following activities: development of treatment plan with patient and/or surrogate as well as nursing, discussions with consultants, evaluation of patient's response to treatment, examination of patient, obtaining history from patient or surrogate, ordering and performing treatments and interventions, ordering and review of laboratory studies, ordering and review of radiographic studies, pulse oximetry and re-evaluation of patient's condition.    Fransico Meadow, MD 07/13/22 [phone]

## 2022-07-26 NOTE — ED Notes (Signed)
Pulse check, pt in PEA, Epi  given

## 2022-07-26 NOTE — ED Notes (Signed)
0940 pulse check, pt PEA.

## 2022-07-26 NOTE — ED Notes (Signed)
Ed (son in law) would like a call regarding pt status. Phone is [phone] or [phone]

## 2022-07-26 NOTE — ED Triage Notes (Signed)
Pt arrived via [facility]. Pt was found outside at 0800. Unknown downtime. EMS started CPR at 0847 by EMS. EMS gave epix8. Pt arrived intubated and with lucas going. Pt is in PEA. Pt is very cold to touch. Per EMS pt's eyes are fixed and dilated and pt is posturing. Dr Philip Aspen and RT at bedside.

## 2022-07-26 NOTE — ED Notes (Signed)
Pulse check pt PEA,

## 2022-07-26 NOTE — ED Notes (Signed)
Pulse check, PEA, Time of death called

## 2022-07-26 DEATH — deceased

## 2022-09-06 ENCOUNTER — Encounter: Payer: Self-pay | Admitting: Internal Medicine
